# Patient Record
Sex: Female | Born: 1970
Health system: Southern US, Community
[De-identification: ages and names within clinical notes are randomized; demographics above are authoritative.]

## PROBLEM LIST (undated history)

## (undated) DIAGNOSIS — F410 Panic disorder [episodic paroxysmal anxiety] without agoraphobia: Secondary | ICD-10-CM

## (undated) DIAGNOSIS — F32A Depression, unspecified: Secondary | ICD-10-CM

## (undated) DIAGNOSIS — E039 Hypothyroidism, unspecified: Secondary | ICD-10-CM

## (undated) DIAGNOSIS — E559 Vitamin D deficiency, unspecified: Secondary | ICD-10-CM

## (undated) DIAGNOSIS — F329 Major depressive disorder, single episode, unspecified: Secondary | ICD-10-CM

## (undated) DIAGNOSIS — F419 Anxiety disorder, unspecified: Secondary | ICD-10-CM

## (undated) DIAGNOSIS — I10 Essential (primary) hypertension: Secondary | ICD-10-CM

## (undated) DIAGNOSIS — R5383 Other fatigue: Secondary | ICD-10-CM

## (undated) DIAGNOSIS — E119 Type 2 diabetes mellitus without complications: Secondary | ICD-10-CM

## (undated) HISTORY — DX: Other fatigue: R53.83

## (undated) HISTORY — DX: Major depressive disorder, single episode, unspecified: F32.9

## (undated) HISTORY — DX: Panic disorder (episodic paroxysmal anxiety): F41.0

## (undated) HISTORY — DX: Type 2 diabetes mellitus without complications: E11.9

## (undated) HISTORY — DX: Hypothyroidism, unspecified: E03.9

## (undated) HISTORY — DX: Anxiety disorder, unspecified: F41.9

## (undated) HISTORY — DX: Vitamin D deficiency, unspecified: E55.9

## (undated) HISTORY — DX: Depression, unspecified: F32.A

## (undated) HISTORY — DX: Essential (primary) hypertension: I10

---
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
## 2003-11-22 HISTORY — PX: THYROIDECTOMY: SHX17

## 2006-03-22 ENCOUNTER — Ambulatory Visit: Payer: Self-pay

## 2006-08-14 ENCOUNTER — Encounter: Payer: Self-pay | Admitting: General Practice

## 2006-08-21 ENCOUNTER — Encounter: Payer: Self-pay | Admitting: General Practice

## 2006-09-21 ENCOUNTER — Encounter: Payer: Self-pay | Admitting: General Practice

## 2007-08-17 ENCOUNTER — Ambulatory Visit: Payer: Self-pay

## 2008-04-16 ENCOUNTER — Emergency Department: Payer: Self-pay | Admitting: Emergency Medicine

## 2010-07-15 ENCOUNTER — Ambulatory Visit: Payer: Self-pay

## 2010-07-20 ENCOUNTER — Other Ambulatory Visit: Payer: Self-pay | Admitting: Physician Assistant

## 2011-01-10 ENCOUNTER — Other Ambulatory Visit: Payer: Self-pay

## 2011-04-15 ENCOUNTER — Other Ambulatory Visit: Payer: Self-pay | Admitting: Physician Assistant

## 2013-07-09 ENCOUNTER — Ambulatory Visit: Payer: Self-pay | Admitting: General Practice

## 2013-07-24 ENCOUNTER — Ambulatory Visit: Payer: Self-pay | Admitting: General Practice

## 2014-02-25 ENCOUNTER — Ambulatory Visit: Payer: Self-pay | Admitting: Obstetrics and Gynecology

## 2015-01-22 LAB — HM PAP SMEAR: HM PAP: NEGATIVE

## 2015-05-21 ENCOUNTER — Other Ambulatory Visit: Payer: Self-pay | Admitting: Obstetrics and Gynecology

## 2015-05-21 ENCOUNTER — Telehealth: Payer: Self-pay | Admitting: *Deleted

## 2015-05-21 MED ORDER — CITALOPRAM HYDROBROMIDE 20 MG PO TABS: 20.0000 mg | ORAL_TABLET | Freq: Every day | ORAL | Status: DC

## 2015-05-21 NOTE — Telephone Encounter (Signed)
Please let her know i sent a prescription in

## 2015-05-21 NOTE — Telephone Encounter (Signed)
Pt would like to go back on celexa, please send rx to [place]

## 2015-05-21 NOTE — Telephone Encounter (Signed)
Notified pt. 

## 2016-01-26 ENCOUNTER — Encounter: Payer: Self-pay | Admitting: Obstetrics and Gynecology

## 2016-02-15 ENCOUNTER — Encounter: Payer: Self-pay | Admitting: *Deleted

## 2016-02-16 ENCOUNTER — Encounter: Payer: 59 | Admitting: Obstetrics and Gynecology

## 2016-02-17 ENCOUNTER — Other Ambulatory Visit: Payer: Self-pay | Admitting: Obstetrics and Gynecology

## 2016-02-17 DIAGNOSIS — M542 Cervicalgia: Secondary | ICD-10-CM

## 2016-02-17 DIAGNOSIS — G8929 Other chronic pain: Secondary | ICD-10-CM | POA: Insufficient documentation

## 2016-02-17 MED ORDER — CYCLOBENZAPRINE HCL 10 MG PO TABS: 10.0000 mg | ORAL_TABLET | Freq: Three times a day (TID) | ORAL | Status: DC | PRN

## 2016-02-22 ENCOUNTER — Ambulatory Visit: Payer: Self-pay | Admitting: Physician Assistant

## 2016-02-22 ENCOUNTER — Encounter: Payer: Self-pay | Admitting: Physician Assistant

## 2016-02-22 VITALS — BP 122/90 | HR 80 | Temp 98.6°F

## 2016-02-22 DIAGNOSIS — R509 Fever, unspecified: Secondary | ICD-10-CM

## 2016-02-22 DIAGNOSIS — J111 Influenza due to unidentified influenza virus with other respiratory manifestations: Secondary | ICD-10-CM

## 2016-02-22 DIAGNOSIS — R69 Illness, unspecified: Secondary | ICD-10-CM

## 2016-02-22 LAB — POCT INFLUENZA A/B
Influenza A, POC: NEGATIVE
Influenza B, POC: NEGATIVE

## 2016-02-22 MED ORDER — HYDROCOD POLST-CPM POLST ER 10-8 MG/5ML PO SUER: 5.0000 mL | Freq: Two times a day (BID) | ORAL | Status: DC | PRN

## 2016-02-22 NOTE — Progress Notes (Signed)
S: C/o runny nose and congestion with dry cough for 2 days, + fever, chills, some v/d, denies cp/sob, states her ribs hurt from coughing, no diarrhea or vomiting today  Using otc meds: robitussin  O: PE: vitals wnl, nad,  perrl eomi, normocephalic, tms dull, nasal mucosa red and swollen, throat injected, neck supple no lymph, lungs c t a, cv rrr, abd soft nontender bs a little hyper in all 4 quads; neuro intact, flu swab neg  A:  Acute flu like illness   P: drink fluids, continue regular meds , use otc meds of choice, return if not improving in 5 days, return earlier if worsening ; tussionex 123ml nr

## 2016-03-22 ENCOUNTER — Other Ambulatory Visit: Payer: Self-pay | Admitting: Obstetrics and Gynecology

## 2016-04-20 ENCOUNTER — Encounter: Payer: Self-pay | Admitting: Obstetrics and Gynecology

## 2016-04-20 ENCOUNTER — Ambulatory Visit (INDEPENDENT_AMBULATORY_CARE_PROVIDER_SITE_OTHER): Payer: 59 | Admitting: Obstetrics and Gynecology

## 2016-04-20 VITALS — BP 133/90 | HR 102 | Ht 64.0 in | Wt 202.6 lb

## 2016-04-20 DIAGNOSIS — N930 Postcoital and contact bleeding: Secondary | ICD-10-CM

## 2016-04-20 DIAGNOSIS — E559 Vitamin D deficiency, unspecified: Secondary | ICD-10-CM

## 2016-04-20 DIAGNOSIS — E039 Hypothyroidism, unspecified: Secondary | ICD-10-CM

## 2016-04-20 DIAGNOSIS — Z720 Tobacco use: Secondary | ICD-10-CM | POA: Diagnosis not present

## 2016-04-20 DIAGNOSIS — F17200 Nicotine dependence, unspecified, uncomplicated: Secondary | ICD-10-CM | POA: Insufficient documentation

## 2016-04-20 DIAGNOSIS — Z01419 Encounter for gynecological examination (general) (routine) without abnormal findings: Secondary | ICD-10-CM | POA: Diagnosis not present

## 2016-04-20 MED ORDER — ALPRAZOLAM 1 MG PO TABS: 1.0000 mg | ORAL_TABLET | Freq: Two times a day (BID) | ORAL | Status: DC | PRN

## 2016-04-20 NOTE — Patient Instructions (Signed)
  Place annual gynecologic exam patient instructions here.  Thank you for enrolling in [facility]. Please follow the instructions below to securely access your online medical record. MyChart allows you to send messages to your doctor, view your test results, manage appointments, and more.   How Do I Sign Up? 1. In your Internet browser, go to [employer] and enter [email or web address]. 2. Click on the Sign Up Now link in the Sign In box. You will see the New Member Sign Up page. 3. Enter your MyChart Access Code exactly as it appears below. You will not need to use this code after you've completed the sign-up process. If you do not sign up before the expiration date, you must request a new code.  MyChart Access Code: [email or web address] Expires: 06/19/2016 10:33 AM  4. Enter your Social Security Number ([id number]) and Date of Birth (mm/dd/yyyy) as indicated and click Submit. You will be taken to the next sign-up page. 5. Create a MyChart ID. This will be your MyChart login ID and cannot be changed, so think of one that is secure and easy to remember. 6. Create a MyChart password. You can change your password at any time. 7. Enter your Password Reset Question and Answer. This can be used at a later time if you forget your password.  8. Enter your e-mail address. You will receive e-mail notification when new information is available in [facility]. 9. Click Sign Up. You can now view your medical record.   Additional Information Remember, MyChart is NOT to be used for urgent needs. For medical emergencies, dial 911.

## 2016-04-20 NOTE — Progress Notes (Signed)
Subjective:   Julie Marquez is a 45 y.o. G6P1 Caucasian female here for a routine well-woman exam.  Patient's last menstrual period was 04/04/2016.    Current complaints: 2 episodes of bright red painless bleeding after sex PCP: ?       does desire labs  Social History: Sexual: heterosexual Marital Status: married Living situation: with spouse Occupation: CNA at [employer] Tobacco/alcohol: no alcohol use Illicit drugs: no history of illicit drug use  The following portions of the patient's history were reviewed and updated as appropriate: allergies, current medications, past family history, past medical history, past social history, past surgical history and problem list.  Past Medical History Past Medical History  Diagnosis Date  . Anxiety   . Vitamin D deficiency   . Hypothyroidism   . Fatigue   . Panic attack     Past Surgical History Past Surgical History  Procedure Laterality Date  . Thyroidectomy  2005    Gynecologic History G1P1  Patient's last menstrual period was 04/04/2016. Contraception: IUD Last Pap: 2015. Results were: normal Last mammogram: 2015. Results were: normal  Obstetric History OB History  Gravida Para Term Preterm AB SAB TAB Ectopic Multiple Living  1 1            # Outcome Date GA Lbr Len/2nd Weight Sex Delivery Anes PTL Lv  1 Para               Current Medications Current Outpatient Prescriptions on File Prior to Visit  Medication Sig Dispense Refill  . amLODipine (NORVASC) 5 MG tablet TAKE 1 TABLET BY MOUTH ONCE DAILY 90 tablet 1  . citalopram (CELEXA) 20 MG tablet Take 1 tablet (20 mg total) by mouth daily. 30 tablet 12  . cyclobenzaprine (FLEXERIL) 10 MG tablet Take 1 tablet (10 mg total) by mouth every 8 (eight) hours as needed for muscle spasms. 30 tablet 1  . levothyroxine (SYNTHROID, LEVOTHROID) 88 MCG tablet TAKE 1 TABLET BY MOUTH ONCE DAILY 90 tablet 0  . PARAGARD INTRAUTERINE COPPER IU by Intrauterine route.    . Vitamin D,  Ergocalciferol, (DRISDOL) 50000 units CAPS capsule TAKE 1 CAPSULE BY MOUTH 2 TIMES A WEEK 24 capsule 0  . chlorpheniramine-HYDROcodone (TUSSIONEX PENNKINETIC ER) 10-8 MG/5ML SUER Take 5 mLs by mouth every 12 (twelve) hours as needed for cough. (Patient not taking: Reported on 04/20/2016) 150 mL 0  . DULoxetine (CYMBALTA) 30 MG capsule Take 30 mg by mouth daily. Reported on 04/20/2016     No current facility-administered medications on file prior to visit.    Review of Systems Patient denies any headaches, blurred vision, shortness of breath, chest pain, abdominal pain, problems with bowel movements, urination, or intercourse.  Objective:  BP 133/90 mmHg  Pulse 102  Ht 5\' 4"  (1.626 m)  Wt 202 lb 9.6 oz (91.899 kg)  BMI 34.76 kg/m2  LMP 04/04/2016 Physical Exam  General:  Well developed, well nourished, no acute distress. She is alert and oriented x3. Skin:  Warm and dry Neck:  Midline trachea, no thyromegaly or nodules Cardiovascular: Regular rate and rhythm, no murmur heard Lungs:  Effort normal, all lung fields clear to auscultation bilaterally Breasts:  No dominant palpable mass, retraction, or nipple discharge Abdomen:  Soft, non tender, no hepatosplenomegaly or masses Pelvic:  External genitalia is normal in appearance.  The vagina is normal in appearance. The cervix is bulbous, no CMT. IUD string noted. Thin prep pap is not done . Uterus is felt to be normal  size, shape, and contour.  No adnexal masses or tenderness noted. Extremities:  No swelling or varicosities noted Psych:  She has a normal mood and affect  Assessment:   Healthy well-woman exam Hypothyroidism obesity Hypertension H/o vitamin d deficiency Postcoital bleeding   Plan:  Labs obtained F/U 1 year for AE, or sooner if needed Mammogram scheduled  Sargent Mankey Rockney Ghee, CNM

## 2016-04-21 LAB — COMPREHENSIVE METABOLIC PANEL
A/G RATIO: 1.6 (ref 1.2–2.2)
ALT: 21 IU/L (ref 0–32)
AST: 17 IU/L (ref 0–40)
Albumin: 4.1 g/dL (ref 3.5–5.5)
Alkaline Phosphatase: 101 IU/L (ref 39–117)
BUN/Creatinine Ratio: 16 (ref 9–23)
BUN: 10 mg/dL (ref 6–24)
CALCIUM: 9.5 mg/dL (ref 8.7–10.2)
CHLORIDE: 101 mmol/L (ref 96–106)
CO2: 22 mmol/L (ref 18–29)
Creatinine, Ser: 0.64 mg/dL (ref 0.57–1.00)
GFR calc Af Amer: 125 mL/min/{1.73_m2} (ref >59)
GFR, EST NON AFRICAN AMERICAN: 108 mL/min/{1.73_m2} (ref >59)
GLUCOSE: 85 mg/dL (ref 65–99)
Globulin, Total: 2.6 g/dL (ref 1.5–4.5)
POTASSIUM: 4.2 mmol/L (ref 3.5–5.2)
Sodium: 140 mmol/L (ref 134–144)
Total Protein: 6.7 g/dL (ref 6.0–8.5)

## 2016-04-21 LAB — CBC
HEMATOCRIT: 40 % (ref 34.0–46.6)
HEMOGLOBIN: 13.1 g/dL (ref 11.1–15.9)
MCH: 25.5 pg — ABNORMAL LOW (ref 26.6–33.0)
MCHC: 32.8 g/dL (ref 31.5–35.7)
MCV: 78 fL — ABNORMAL LOW (ref 79–97)
PLATELETS: 326 10*3/uL (ref 150–379)
RBC: 5.13 x10E6/uL (ref 3.77–5.28)
RDW: 16.4 % — AB (ref 12.3–15.4)
WBC: 9.5 10*3/uL (ref 3.4–10.8)

## 2016-04-21 LAB — LIPID PANEL
CHOL/HDL RATIO: 4.4 ratio (ref 0.0–4.4)
Cholesterol, Total: 205 mg/dL — ABNORMAL HIGH (ref 100–199)
HDL: 47 mg/dL (ref >39)
LDL CALC: 119 mg/dL — AB (ref 0–99)
TRIGLYCERIDES: 196 mg/dL — AB (ref 0–149)
VLDL Cholesterol Cal: 39 mg/dL (ref 5–40)

## 2016-04-21 LAB — THYROID PANEL WITH TSH
FREE THYROXINE INDEX: 2.5 (ref 1.2–4.9)
T3 Uptake Ratio: 22 % — ABNORMAL LOW (ref 24–39)
T4, Total: 11.4 ug/dL (ref 4.5–12.0)
TSH: 0.12 u[IU]/mL — ABNORMAL LOW (ref 0.450–4.500)

## 2016-04-21 LAB — VITAMIN D 25 HYDROXY (VIT D DEFICIENCY, FRACTURES): VIT D 25 HYDROXY: 54.5 ng/mL (ref 30.0–100.0)

## 2016-04-21 LAB — HEMOGLOBIN A1C
ESTIMATED AVERAGE GLUCOSE: 151 mg/dL
Hgb A1c MFr Bld: 6.9 % — ABNORMAL HIGH (ref 4.8–5.6)

## 2016-04-22 ENCOUNTER — Other Ambulatory Visit: Payer: Self-pay | Admitting: Obstetrics and Gynecology

## 2016-04-22 ENCOUNTER — Telehealth: Payer: Self-pay | Admitting: *Deleted

## 2016-04-22 DIAGNOSIS — E119 Type 2 diabetes mellitus without complications: Secondary | ICD-10-CM | POA: Insufficient documentation

## 2016-04-22 DIAGNOSIS — E038 Other specified hypothyroidism: Secondary | ICD-10-CM

## 2016-04-22 DIAGNOSIS — E039 Hypothyroidism, unspecified: Secondary | ICD-10-CM | POA: Insufficient documentation

## 2016-04-22 NOTE — Telephone Encounter (Signed)
Mailed all info about lab results to pt

## 2016-04-22 NOTE — Telephone Encounter (Signed)
-----   Message from Joylene Igo, [place] sent at 04/22/2016  9:17 AM EDT ----- Have attempted to call a few times- mailbox not set up- please let her know about labs, and that she needs to f/u with endocrinologist about TSH. Also she now has diabetes- I put in a referral for Lifestyle and she needs to again be seen by endocrinologist ASAP to start treatment. Please mail info on diabetes

## 2016-05-28 ENCOUNTER — Other Ambulatory Visit: Payer: Self-pay | Admitting: Obstetrics and Gynecology

## 2016-05-28 DIAGNOSIS — Z1231 Encounter for screening mammogram for malignant neoplasm of breast: Secondary | ICD-10-CM

## 2016-07-15 ENCOUNTER — Other Ambulatory Visit: Payer: Self-pay | Admitting: Obstetrics and Gynecology

## 2016-08-08 ENCOUNTER — Other Ambulatory Visit: Payer: Self-pay | Admitting: Family

## 2016-08-08 ENCOUNTER — Ambulatory Visit: Payer: Self-pay | Admitting: Family

## 2016-08-08 ENCOUNTER — Ambulatory Visit
Admission: RE | Admit: 2016-08-08 | Discharge: 2016-08-08 | Disposition: A | Discharge status: Home / Self Care | Payer: 59 | Source: Ambulatory Visit | Attending: Family | Admitting: Family

## 2016-08-08 ENCOUNTER — Encounter: Payer: Self-pay | Admitting: Physician Assistant

## 2016-08-08 VITALS — BP 100/80 | HR 82 | Temp 98.6°F

## 2016-08-08 DIAGNOSIS — R1084 Generalized abdominal pain: Secondary | ICD-10-CM

## 2016-08-08 DIAGNOSIS — R112 Nausea with vomiting, unspecified: Secondary | ICD-10-CM

## 2016-08-08 DIAGNOSIS — R109 Unspecified abdominal pain: Secondary | ICD-10-CM | POA: Diagnosis not present

## 2016-08-08 DIAGNOSIS — K760 Fatty (change of) liver, not elsewhere classified: Secondary | ICD-10-CM | POA: Diagnosis not present

## 2016-08-08 DIAGNOSIS — R319 Hematuria, unspecified: Secondary | ICD-10-CM

## 2016-08-08 DIAGNOSIS — M549 Dorsalgia, unspecified: Secondary | ICD-10-CM

## 2016-08-08 LAB — POCT URINALYSIS DIPSTICK
Bilirubin, UA: NEGATIVE
Color, UA: NEGATIVE
GLUCOSE UA: NEGATIVE
Ketones, UA: NEGATIVE
Nitrite, UA: NEGATIVE
SPEC GRAV UA: 1.02
UROBILINOGEN UA: 1
pH, UA: 7

## 2016-08-08 LAB — POCT URINE PREGNANCY: PREG TEST UR: NEGATIVE

## 2016-08-08 MED ORDER — ONDANSETRON 8 MG PO TBDP: 8.0000 mg | ORAL_TABLET | Freq: Three times a day (TID) | ORAL | 0 refills | Status: DC | PRN

## 2016-08-08 MED ORDER — CIPROFLOXACIN HCL 500 MG PO TABS: 500.0000 mg | ORAL_TABLET | Freq: Two times a day (BID) | ORAL | 0 refills | Status: DC

## 2016-08-08 NOTE — Progress Notes (Signed)
S/ acute onset last night of generalised  abd pain with waves of increasing intensity  , with nausea and vomiting, unable to sleep, back pain,  Admits, urinary frequency and urgency without burning: poor po fluid intake  , now with anorexia , bloating . Last bm 2 d ago , normal pattern is q 3 days, neg gyn sxs +paraquard- neg menses DM sugars normal   O/ appears mod uncomfortable, tearful VSS  U/a dip with 2 + blood/protein  Only Hcg neg Heart rsr Lungs clear Abd soft diffusely tender with guarding RLQ, BS +  A/ abd pain / hematuria / n&v,back pain ? Stone  ,? Appendicitis  Constipation  P/ referred for Kidney US . Zofran, cipro, tramadol. hydration encouraged , close check on sugars, oow today and tomorrow. If sxs not improving call ,RTC or  seek emergent care if after hours advised.

## 2016-08-08 NOTE — Progress Notes (Signed)
Spoke with Shaquinta in scheduling for U/S. Per Shaquinta patient to go to [place] @ 3:00 today to register in for Ultra sound

## 2016-08-10 ENCOUNTER — Encounter: Payer: Self-pay | Admitting: Family

## 2016-08-10 DIAGNOSIS — K760 Fatty (change of) liver, not elsewhere classified: Secondary | ICD-10-CM | POA: Insufficient documentation

## 2016-10-05 DIAGNOSIS — H5213 Myopia, bilateral: Secondary | ICD-10-CM | POA: Diagnosis not present

## 2016-10-05 LAB — HM DIABETES EYE EXAM

## 2016-11-08 ENCOUNTER — Encounter: Payer: Self-pay | Admitting: Obstetrics and Gynecology

## 2016-11-09 ENCOUNTER — Other Ambulatory Visit: Payer: Self-pay | Admitting: *Deleted

## 2016-11-09 MED ORDER — AMLODIPINE BESYLATE 5 MG PO TABS: 5.0000 mg | ORAL_TABLET | Freq: Every day | ORAL | 2 refills | Status: DC

## 2016-11-09 MED ORDER — LEVOTHYROXINE SODIUM 88 MCG PO TABS: 88.0000 ug | ORAL_TABLET | Freq: Every day | ORAL | 3 refills | Status: DC

## 2016-11-09 MED ORDER — CITALOPRAM HYDROBROMIDE 20 MG PO TABS: 20.0000 mg | ORAL_TABLET | Freq: Every day | ORAL | 4 refills | Status: DC

## 2016-11-09 MED ORDER — METFORMIN HCL ER 500 MG PO TB24: 500.0000 mg | ORAL_TABLET | Freq: Two times a day (BID) | ORAL | 4 refills | Status: DC

## 2016-11-09 MED ORDER — ALPRAZOLAM 1 MG PO TABS: 1.0000 mg | ORAL_TABLET | Freq: Two times a day (BID) | ORAL | 2 refills | Status: DC | PRN

## 2017-01-11 DIAGNOSIS — E559 Vitamin D deficiency, unspecified: Secondary | ICD-10-CM | POA: Insufficient documentation

## 2017-01-11 DIAGNOSIS — I10 Essential (primary) hypertension: Secondary | ICD-10-CM | POA: Diagnosis not present

## 2017-01-11 DIAGNOSIS — F419 Anxiety disorder, unspecified: Secondary | ICD-10-CM | POA: Diagnosis not present

## 2017-01-11 DIAGNOSIS — E039 Hypothyroidism, unspecified: Secondary | ICD-10-CM | POA: Diagnosis not present

## 2017-01-11 DIAGNOSIS — E119 Type 2 diabetes mellitus without complications: Secondary | ICD-10-CM | POA: Diagnosis not present

## 2017-02-17 DIAGNOSIS — F418 Other specified anxiety disorders: Secondary | ICD-10-CM | POA: Diagnosis not present

## 2017-03-27 DIAGNOSIS — R6889 Other general symptoms and signs: Secondary | ICD-10-CM | POA: Diagnosis not present

## 2017-03-27 DIAGNOSIS — E039 Hypothyroidism, unspecified: Secondary | ICD-10-CM | POA: Diagnosis not present

## 2017-03-27 DIAGNOSIS — E119 Type 2 diabetes mellitus without complications: Secondary | ICD-10-CM | POA: Diagnosis not present

## 2017-03-27 DIAGNOSIS — Z23 Encounter for immunization: Secondary | ICD-10-CM | POA: Diagnosis not present

## 2017-03-27 DIAGNOSIS — F419 Anxiety disorder, unspecified: Secondary | ICD-10-CM | POA: Diagnosis not present

## 2017-05-23 ENCOUNTER — Ambulatory Visit: Payer: Self-pay | Admitting: Family

## 2017-05-23 VITALS — BP 120/80 | HR 82 | Temp 98.4°F

## 2017-05-23 DIAGNOSIS — R059 Cough, unspecified: Secondary | ICD-10-CM

## 2017-05-23 DIAGNOSIS — Z789 Other specified health status: Secondary | ICD-10-CM

## 2017-05-23 DIAGNOSIS — R05 Cough: Secondary | ICD-10-CM

## 2017-05-23 DIAGNOSIS — R0982 Postnasal drip: Secondary | ICD-10-CM

## 2017-05-23 MED ORDER — FLUTICASONE PROPIONATE 50 MCG/ACT NA SUSP: 2.0000 | Freq: Every day | NASAL | 6 refills | Status: DC

## 2017-05-23 MED ORDER — OMEPRAZOLE 40 MG PO CPDR: 40.0000 mg | DELAYED_RELEASE_CAPSULE | Freq: Every day | ORAL | 3 refills | Status: DC

## 2017-05-23 MED ORDER — AZITHROMYCIN 250 MG PO TABS: ORAL_TABLET | ORAL | 0 refills | Status: DC

## 2017-05-23 NOTE — Progress Notes (Signed)
S:/Dry irritative cough for 4 months so severe that she will vomit. Admits to sour eructation. Without nausea , or fatty food intolerance. Upon questioning states she is drinking 80-100 ounces of caffeine in the form of diet sodas and coffee throughout the day. She has been told she has allergies but is not taking any medication as states they did not work. She has tried [employer]. She states that when she coughs violently ,her left arm is numb but denies neck issues.  Objective vital signs stable alert pleasant no acute distress ENT: nasal mucosa erythematous 4+ boggy, left maxillary tenderness, pharynx clear neck supple without adenopathy heart RSR lungs are clear abdomen nontender A/: Cough question from PND and or reflux Excessive caffeine intake  P/: Strongly urged to stop caffeine products due to GI side effects likely contributing to and not helping her reflux symptoms. She is urged to drink water. Rx for Prilosec 40 mg given to take one time daily for a month follow-up. If symptoms not improving may need referral to GI. Other preventive measures are discussed.  allergy tips reviewed and encouraged . Rx for Flonase given.

## 2017-07-27 ENCOUNTER — Other Ambulatory Visit: Payer: Self-pay | Admitting: Obstetrics and Gynecology

## 2017-10-17 DIAGNOSIS — H109 Unspecified conjunctivitis: Secondary | ICD-10-CM | POA: Diagnosis not present

## 2017-12-28 ENCOUNTER — Ambulatory Visit (INDEPENDENT_AMBULATORY_CARE_PROVIDER_SITE_OTHER): Payer: Self-pay | Admitting: Family Medicine

## 2017-12-28 VITALS — BP 125/76 | HR 86 | Temp 98.4°F | Wt 197.0 lb

## 2017-12-28 DIAGNOSIS — H6123 Impacted cerumen, bilateral: Secondary | ICD-10-CM

## 2017-12-28 MED ORDER — CARBAMIDE PEROXIDE 6.5 % OT SOLN: 5.0000 [drp] | Freq: Two times a day (BID) | OTIC | 0 refills | Status: DC

## 2017-12-28 NOTE — Progress Notes (Signed)
Subjective:  Julie Marquez is a 47 y.o. female who presents for evaluation of bilateral ear clogging.  Onset of symptoms was 1 week ago, and has been unchanged since that time.  Treatment to date: Sweet oil drops. Denies pain or drainage. Endorses diminished hearing.   The following portions of the patient's history were reviewed and updated as appropriate:  allergies, current medications and past medical history.  Pertinent items are noted in HPI. Objective:  BP 125/76   Pulse 86   Temp 98.4 F (36.9 C)   Wt 197 lb (89.4 kg)   SpO2 96%   BMI 33.81 kg/m  Ears: abnormal TM right ear - cerumen impacted    Assessment:  Cerumen Impaction     Plan:  Ear irrigation performed. Debrox drops prescribed as needed to prevent recurring cerumen impaction.   Meds ordered this encounter  Medications  . carbamide peroxide (DEBROX) 6.5 % OTIC solution    Sig: Place 5 drops into both ears 2 (two) times daily.    Dispense:  15 mL    Refill:  0     RTC: As needed     Carroll Sage. Kenton Kingfisher, MSN, [place], [place] [address] [phone]

## 2017-12-28 NOTE — Patient Instructions (Addendum)
I have ordered Debrox ear drops for management of cerumen buildup. Use as directed.     Earwax Buildup, Adult The ears produce a substance called earwax that helps keep bacteria out of the ear and protects the skin in the ear canal. Occasionally, earwax can build up in the ear and cause discomfort or hearing loss. What increases the risk? This condition is more likely to develop in people who:  Are female.  Are elderly.  Naturally produce more earwax.  Clean their ears often with cotton swabs.  Use earplugs often.  Use in-ear headphones often.  Wear hearing aids.  Have narrow ear canals.  Have earwax that is overly thick or sticky.  Have eczema.  Are dehydrated.  Have excess hair in the ear canal.  What are the signs or symptoms? Symptoms of this condition include:  Reduced or muffled hearing.  A feeling of fullness in the ear or feeling that the ear is plugged.  Fluid coming from the ear.  Ear pain.  Ear itch.  Ringing in the ear.  Coughing.  An obvious piece of earwax that can be seen inside the ear canal.  How is this diagnosed? This condition may be diagnosed based on:  Your symptoms.  Your medical history.  An ear exam. During the exam, your health care provider will look into your ear with an instrument called an otoscope.  You may have tests, including a hearing test. How is this treated? This condition may be treated by:  Using ear drops to soften the earwax.  Having the earwax removed by a health care provider. The health care provider may: ? Flush the ear with water. ? Use an instrument that has a loop on the end (curette). ? Use a suction device.  Surgery to remove the wax buildup. This may be done in severe cases.  Follow these instructions at home:  Take over-the-counter and prescription medicines only as told by your health care provider.  Do not put any objects, including cotton swabs, into your ear. You can clean the opening  of your ear canal with a washcloth or facial tissue.  Follow instructions from your health care provider about cleaning your ears. Do not over-clean your ears.  Drink enough fluid to keep your urine clear or pale yellow. This will help to thin the earwax.  Keep all follow-up visits as told by your health care provider. If earwax builds up in your ears often or if you use hearing aids, consider seeing your health care provider for routine, preventive ear cleanings. Ask your health care provider how often you should schedule your cleanings.  If you have hearing aids, clean them according to instructions from the manufacturer and your health care provider. Contact a health care provider if:  You have ear pain.  You develop a fever.  You have blood, pus, or other fluid coming from your ear.  You have hearing loss.  You have ringing in your ears that does not go away.  Your symptoms do not improve with treatment.  You feel like the room is spinning (vertigo). Summary  Earwax can build up in the ear and cause discomfort or hearing loss.  The most common symptoms of this condition include reduced or muffled hearing and a feeling of fullness in the ear or feeling that the ear is plugged.  This condition may be diagnosed based on your symptoms, your medical history, and an ear exam.  This condition may be treated by using ear  drops to soften the earwax or by having the earwax removed by a health care provider.  Do not put any objects, including cotton swabs, into your ear. You can clean the opening of your ear canal with a washcloth or facial tissue. This information is not intended to replace advice given to you by your health care provider. Make sure you discuss any questions you have with your health care provider. Document Released: 12/15/2004 Document Revised: 01/18/2017 Document Reviewed: 01/18/2017 Elsevier Interactive Patient Education  [employer].

## 2018-02-22 ENCOUNTER — Other Ambulatory Visit: Payer: Self-pay | Admitting: Obstetrics and Gynecology

## 2018-04-26 ENCOUNTER — Telehealth: Payer: Self-pay | Admitting: Obstetrics and Gynecology

## 2018-04-26 NOTE — Telephone Encounter (Signed)
The patient called and stated that she would like to speak with Amy in regards to her needing an Annual Exam scheduled. Please advise.

## 2018-04-27 NOTE — Telephone Encounter (Signed)
Called pt she will call back ,needs annual done for insurance

## 2018-05-09 ENCOUNTER — Encounter: Payer: Self-pay | Admitting: Family Medicine

## 2018-05-09 ENCOUNTER — Ambulatory Visit (INDEPENDENT_AMBULATORY_CARE_PROVIDER_SITE_OTHER): Payer: Self-pay | Admitting: Family Medicine

## 2018-05-09 ENCOUNTER — Ambulatory Visit (INDEPENDENT_AMBULATORY_CARE_PROVIDER_SITE_OTHER): Payer: No Typology Code available for payment source | Admitting: Family Medicine

## 2018-05-09 VITALS — BP 130/86 | HR 88 | Temp 98.3°F | Ht 63.0 in | Wt 196.0 lb

## 2018-05-09 VITALS — BP 116/82 | HR 97 | Temp 98.0°F | Resp 12 | Ht 63.0 in | Wt 196.3 lb

## 2018-05-09 DIAGNOSIS — Z5181 Encounter for therapeutic drug level monitoring: Secondary | ICD-10-CM | POA: Diagnosis not present

## 2018-05-09 DIAGNOSIS — Z1231 Encounter for screening mammogram for malignant neoplasm of breast: Secondary | ICD-10-CM | POA: Diagnosis not present

## 2018-05-09 DIAGNOSIS — Z1239 Encounter for other screening for malignant neoplasm of breast: Secondary | ICD-10-CM

## 2018-05-09 DIAGNOSIS — E1165 Type 2 diabetes mellitus with hyperglycemia: Secondary | ICD-10-CM

## 2018-05-09 DIAGNOSIS — G4769 Other sleep related movement disorders: Secondary | ICD-10-CM | POA: Insufficient documentation

## 2018-05-09 DIAGNOSIS — E038 Other specified hypothyroidism: Secondary | ICD-10-CM | POA: Diagnosis not present

## 2018-05-09 DIAGNOSIS — Z0000 Encounter for general adult medical examination without abnormal findings: Secondary | ICD-10-CM

## 2018-05-09 DIAGNOSIS — M542 Cervicalgia: Secondary | ICD-10-CM

## 2018-05-09 DIAGNOSIS — Z720 Tobacco use: Secondary | ICD-10-CM | POA: Diagnosis not present

## 2018-05-09 DIAGNOSIS — R3129 Other microscopic hematuria: Secondary | ICD-10-CM | POA: Diagnosis not present

## 2018-05-09 DIAGNOSIS — I10 Essential (primary) hypertension: Secondary | ICD-10-CM | POA: Diagnosis not present

## 2018-05-09 DIAGNOSIS — R809 Proteinuria, unspecified: Secondary | ICD-10-CM | POA: Diagnosis not present

## 2018-05-09 DIAGNOSIS — IMO0002 Reserved for concepts with insufficient information to code with codable children: Secondary | ICD-10-CM | POA: Insufficient documentation

## 2018-05-09 DIAGNOSIS — K760 Fatty (change of) liver, not elsewhere classified: Secondary | ICD-10-CM | POA: Diagnosis not present

## 2018-05-09 DIAGNOSIS — E119 Type 2 diabetes mellitus without complications: Secondary | ICD-10-CM | POA: Diagnosis not present

## 2018-05-09 HISTORY — DX: Tobacco use: Z72.0

## 2018-05-09 NOTE — Patient Instructions (Signed)
Continue close with PCP to address all overdue health maintenance and obtain recommended labs as these are unable to be performed here at this site today.  Due to your chronic conditions, I recommend you schedule a complete physical exam with your PCP as today only services as a general wellness exam that will satisfy the requirement of your employee sponsored health benefits.   Health Maintenance, Female Adopting a healthy lifestyle and getting preventive care can go a long way to promote health and wellness. Talk with your health care provider about what schedule of regular examinations is right for you. This is a good chance for you to check in with your provider about disease prevention and staying healthy. In between checkups, there are plenty of things you can do on your own. Experts have done a lot of research about which lifestyle changes and preventive measures are most likely to keep you healthy. Ask your health care provider for more information. Weight and diet Eat a healthy diet  Be sure to include plenty of vegetables, fruits, low-fat dairy products, and lean protein.  Do not eat a lot of foods high in solid fats, added sugars, or salt.  Get regular exercise. This is one of the most important things you can do for your health. ? Most adults should exercise for at least 150 minutes each week. The exercise should increase your heart rate and make you sweat (moderate-intensity exercise). ? Most adults should also do strengthening exercises at least twice a week. This is in addition to the moderate-intensity exercise.  Maintain a healthy weight  Body mass index (BMI) is a measurement that can be used to identify possible weight problems. It estimates body fat based on height and weight. Your health care provider can help determine your BMI and help you achieve or maintain a healthy weight.  For females 11 years of age and older: ? A BMI below 18.5 is considered underweight. ? A BMI  of 18.5 to 24.9 is normal. ? A BMI of 25 to 29.9 is considered overweight. ? A BMI of 30 and above is considered obese.  Watch levels of cholesterol and blood lipids  You should start having your blood tested for lipids and cholesterol at 47 years of age, then have this test every 5 years.  You may need to have your cholesterol levels checked more often if: ? Your lipid or cholesterol levels are high. ? You are older than 47 years of age. ? You are at high risk for heart disease.  Cancer screening Lung Cancer  Lung cancer screening is recommended for adults 68-45 years old who are at high risk for lung cancer because of a history of smoking.  A yearly low-dose CT scan of the lungs is recommended for people who: ? Currently smoke. ? Have quit within the past 15 years. ? Have at least a 30-pack-year history of smoking. A pack year is smoking an average of one pack of cigarettes a day for 1 year.  Yearly screening should continue until it has been 15 years since you quit.  Yearly screening should stop if you develop a health problem that would prevent you from having lung cancer treatment.  Breast Cancer  Practice breast self-awareness. This means understanding how your breasts normally appear and feel.  It also means doing regular breast self-exams. Let your health care provider know about any changes, no matter how small.  If you are in your 20s or 30s, you should have a clinical  breast exam (CBE) by a health care provider every 1-3 years as part of a regular health exam.  If you are 42 or older, have a CBE every year. Also consider having a breast X-ray (mammogram) every year.  If you have a family history of breast cancer, talk to your health care provider about genetic screening.  If you are at high risk for breast cancer, talk to your health care provider about having an MRI and a mammogram every year.  Breast cancer gene (BRCA) assessment is recommended for women who have  family members with BRCA-related cancers. BRCA-related cancers include: ? Breast. ? Ovarian. ? Tubal. ? Peritoneal cancers.  Results of the assessment will determine the need for genetic counseling and BRCA1 and BRCA2 testing.  Cervical Cancer Your health care provider may recommend that you be screened regularly for cancer of the pelvic organs (ovaries, uterus, and vagina). This screening involves a pelvic examination, including checking for microscopic changes to the surface of your cervix (Pap test). You may be encouraged to have this screening done every 3 years, beginning at age 30.  For women ages 89-65, health care providers may recommend pelvic exams and Pap testing every 3 years, or they may recommend the Pap and pelvic exam, combined with testing for human papilloma virus (HPV), every 5 years. Some types of HPV increase your risk of cervical cancer. Testing for HPV may also be done on women of any age with unclear Pap test results.  Other health care providers may not recommend any screening for nonpregnant women who are considered low risk for pelvic cancer and who do not have symptoms. Ask your health care provider if a screening pelvic exam is right for you.  If you have had past treatment for cervical cancer or a condition that could lead to cancer, you need Pap tests and screening for cancer for at least 20 years after your treatment. If Pap tests have been discontinued, your risk factors (such as having a new sexual partner) need to be reassessed to determine if screening should resume. Some women have medical problems that increase the chance of getting cervical cancer. In these cases, your health care provider may recommend more frequent screening and Pap tests.  Colorectal Cancer  This type of cancer can be detected and often prevented.  Routine colorectal cancer screening usually begins at 47 years of age and continues through 47 years of age.  Your health care provider may  recommend screening at an earlier age if you have risk factors for colon cancer.  Your health care provider may also recommend using home test kits to check for hidden blood in the stool.  A small camera at the end of a tube can be used to examine your colon directly (sigmoidoscopy or colonoscopy). This is done to check for the earliest forms of colorectal cancer.  Routine screening usually begins at age 61.  Direct examination of the colon should be repeated every 5-10 years through 47 years of age. However, you may need to be screened more often if early forms of precancerous polyps or small growths are found.  Skin Cancer  Check your skin from head to toe regularly.  Tell your health care provider about any new moles or changes in moles, especially if there is a change in a mole's shape or color.  Also tell your health care provider if you have a mole that is larger than the size of a pencil eraser.  Always use sunscreen. Apply  sunscreen liberally and repeatedly throughout the day.  Protect yourself by wearing long sleeves, pants, a wide-brimmed hat, and sunglasses whenever you are outside.  Heart disease, diabetes, and high blood pressure  High blood pressure causes heart disease and increases the risk of stroke. High blood pressure is more likely to develop in: ? People who have blood pressure in the high end of the normal range (130-139/85-89 mm Hg). ? People who are overweight or obese. ? People who are African American.  If you are 27-30 years of age, have your blood pressure checked every 3-5 years. If you are 71 years of age or older, have your blood pressure checked every year. You should have your blood pressure measured twice-once when you are at a hospital or clinic, and once when you are not at a hospital or clinic. Record the average of the two measurements. To check your blood pressure when you are not at a hospital or clinic, you can use: ? An automated blood pressure  machine at a pharmacy. ? A home blood pressure monitor.  If you are between 56 years and 55 years old, ask your health care provider if you should take aspirin to prevent strokes.  Have regular diabetes screenings. This involves taking a blood sample to check your fasting blood sugar level. ? If you are at a normal weight and have a low risk for diabetes, have this test once every three years after 47 years of age. ? If you are overweight and have a high risk for diabetes, consider being tested at a younger age or more often. Preventing infection Hepatitis B  If you have a higher risk for hepatitis B, you should be screened for this virus. You are considered at high risk for hepatitis B if: ? You were born in a country where hepatitis B is common. Ask your health care provider which countries are considered high risk. ? Your parents were born in a high-risk country, and you have not been immunized against hepatitis B (hepatitis B vaccine). ? You have HIV or AIDS. ? You use needles to inject street drugs. ? You live with someone who has hepatitis B. ? You have had sex with someone who has hepatitis B. ? You get hemodialysis treatment. ? You take certain medicines for conditions, including cancer, organ transplantation, and autoimmune conditions.  Hepatitis C  Blood testing is recommended for: ? Everyone born from [birth year] through 1965. ? Anyone with known risk factors for hepatitis C.  Sexually transmitted infections (STIs)  You should be screened for sexually transmitted infections (STIs) including gonorrhea and chlamydia if: ? You are sexually active and are younger than 47 years of age. ? You are older than 47 years of age and your health care provider tells you that you are at risk for this type of infection. ? Your sexual activity has changed since you were last screened and you are at an increased risk for chlamydia or gonorrhea. Ask your health care provider if you are at  risk.  If you do not have HIV, but are at risk, it may be recommended that you take a prescription medicine daily to prevent HIV infection. This is called pre-exposure prophylaxis (PrEP). You are considered at risk if: ? You are sexually active and do not regularly use condoms or know the HIV status of your partner(s). ? You take drugs by injection. ? You are sexually active with a partner who has HIV.  Talk with your health care provider  about whether you are at high risk of being infected with HIV. If you choose to begin PrEP, you should first be tested for HIV. You should then be tested every 3 months for as long as you are taking PrEP. Pregnancy  If you are premenopausal and you may become pregnant, ask your health care provider about preconception counseling.  If you may become pregnant, take 400 to 800 micrograms (mcg) of folic acid every day.  If you want to prevent pregnancy, talk to your health care provider about birth control (contraception). Osteoporosis and menopause  Osteoporosis is a disease in which the bones lose minerals and strength with aging. This can result in serious bone fractures. Your risk for osteoporosis can be identified using a bone density scan.  If you are 61 years of age or older, or if you are at risk for osteoporosis and fractures, ask your health care provider if you should be screened.  Ask your health care provider whether you should take a calcium or vitamin D supplement to lower your risk for osteoporosis.  Menopause may have certain physical symptoms and risks.  Hormone replacement therapy may reduce some of these symptoms and risks. Talk to your health care provider about whether hormone replacement therapy is right for you. Follow these instructions at home:  Schedule regular health, dental, and eye exams.  Stay current with your immunizations.  Do not use any tobacco products including cigarettes, chewing tobacco, or electronic  cigarettes.  If you are pregnant, do not drink alcohol.  If you are breastfeeding, limit how much and how often you drink alcohol.  Limit alcohol intake to no more than 1 drink per day for nonpregnant women. One drink equals 12 ounces of beer, 5 ounces of wine, or 1 ounces of hard liquor.  Do not use street drugs.  Do not share needles.  Ask your health care provider for help if you need support or information about quitting drugs.  Tell your health care provider if you often feel depressed.  Tell your health care provider if you have ever been abused or do not feel safe at home. This information is not intended to replace advice given to you by your health care provider. Make sure you discuss any questions you have with your health care provider. Document Released: 05/23/2011 Document Revised: 04/14/2016 Document Reviewed: 08/11/2015 Elsevier Interactive Patient Education  [employer].

## 2018-05-09 NOTE — Assessment & Plan Note (Signed)
Occasional use of flexeril; advised to never combine with alcohol or xanax

## 2018-05-09 NOTE — Assessment & Plan Note (Signed)
Refer to neurology sleep specialist

## 2018-05-09 NOTE — Assessment & Plan Note (Signed)
Encouraged cessation.

## 2018-05-09 NOTE — Assessment & Plan Note (Addendum)
On [place]; recommended weight loss, diet low in saturated fats

## 2018-05-09 NOTE — Progress Notes (Signed)
Subjective:  Julie Marquez is a 47 y.o. female who presents for wellness exam. Medical history is significant for hypertension, fatty liver disease, hypothyroidism, tobacco abuse (current daily smoker), obesity (BMI 34.7)and type 2 diabetes. Shamya established care with [facility] today, however, was unable to obtain a physical or overall wellness exam due to multiple chronic conditions needing to be addressed. She is required to obtain a wellness exam in order to meet the requirements of employer health sponsored health benefits. She denies any current health related concerns as they were addressed during her visit today with PCP.  Immunization History  Administered Date(s) Administered  . Pneumococcal Polysaccharide-23 03/27/2017   Past Medical History:  Diagnosis Date  . Anxiety   . Depression   . Diabetes mellitus without complication ([place])   . Fatigue   . Hypertension   . Hypothyroidism   . Panic attack   . Vitamin D deficiency     Past Surgical History:  Procedure Laterality Date  . THYROIDECTOMY  2005    Social History   Tobacco Use  . Smoking status: Current Every Day Smoker    Packs/day: 0.25    Years: 20.00    Pack years: 5.00    Types: Cigarettes  . Smokeless tobacco: Never Used  Substance Use Topics  . Alcohol use: Yes    Comment: occas  . Drug use: No    Allergies  Allergen Reactions  . Lisinopril     cough    Current Outpatient Medications  Medication Sig Dispense Refill  . ALPRAZolam (XANAX) 1 MG tablet TAKE 1 TABLET BY MOUTH TWO TIMES DAILY AS NEEDED FOR ANXIETY 45 tablet 2  . amLODipine (NORVASC) 5 MG tablet Take 1 tablet (5 mg total) by mouth daily. 90 tablet 2  . atorvastatin (LIPITOR) 20 MG tablet Take 20 mg by mouth daily.    . citalopram (CELEXA) 20 MG tablet Take 1 tablet (20 mg total) by mouth daily. 30 tablet 4  . cyclobenzaprine (FLEXERIL) 10 MG tablet Take 1 tablet (10 mg total) by mouth every 8 (eight) hours as needed  for muscle spasms. 30 tablet 1  . fluticasone (FLONASE) 50 MCG/ACT nasal spray Place 2 sprays into both nostrils daily. 16 g 6  . levothyroxine (SYNTHROID, LEVOTHROID) 25 MCG tablet Take 25 mcg by mouth daily.  0  . metFORMIN (GLUCOPHAGE-XR) 500 MG 24 hr tablet Take 1 tablet (500 mg total) by mouth 2 (two) times daily. 60 tablet 4  . PARAGARD INTRAUTERINE COPPER IU by Intrauterine route.     No current facility-administered medications for this visit.    ROS Pertinent negatives listed in HPI Objective:  Blood pressure 130/86, pulse 88, temperature 98.3 F (36.8 C), height 5\' 3"  (1.6 m), weight 196 lb (88.9 kg), last menstrual period 04/25/2018, SpO2 96 %. Physical Exam  Constitutional: She is oriented to person, place, and time. She appears well-developed and well-nourished.  HENT:  Head: Normocephalic.  Eyes: Pupils are equal, round, and reactive to light. Conjunctivae and EOM are normal.  Neck: Normal range of motion. Neck supple.  Cardiovascular: Normal rate, regular rhythm, normal heart sounds and intact distal pulses.  Respiratory: Effort normal and breath sounds normal.  Neurological: She is alert and oriented to person, place, and time.  Skin: Skin is warm and dry.  Psychiatric: She has a normal mood and affect. Her behavior is normal. Judgment and thought content normal.     Hearing Screening   125Hz  250Hz  500Hz  1000Hz  2000Hz  3000Hz   4000Hz  6000Hz  8000Hz   Right ear:   Pass Pass Pass  Pass    Left ear:   Pass Pass Pass  Pass      Visual Acuity Screening   Right eye Left eye Both eyes  Without correction: 20/25 20/25 20/25   With correction:      Assessment and Plan:   1. Encounter for wellness examination,  Patient education provided. Patient will follow up with PCP regarding ordered labs. Patient was advised that [facility] doesn't perform routine screening labs and chronic condition monitoring labs. patient also advised that due to multiple chronic conditions and overdue  health, maintenance I recommend a comprehensive complete physical exam performed by her PCP.  Patient verbalized understanding.   Carroll Sage. Kenton Kingfisher, MSN, FNP-C [facility]  [address] [place], [place] [address] [phone]

## 2018-05-09 NOTE — Assessment & Plan Note (Signed)
Didn't tolerate ACE-I (cough); doing well on CCB

## 2018-05-09 NOTE — Patient Instructions (Addendum)
Check out the information at familydoctor.org entitled "Nutrition for Weight Loss: What You Need to Know about Fad Diets" Try to lose between 1-2 pounds per week by taking in fewer calories and burning off more calories You can succeed by limiting portions, limiting foods dense in calories and fat, becoming more active, and drinking 8 glasses of water a day (64 ounces) Don't skip meals, especially breakfast, as skipping meals may alter your metabolism Do not use over-the-counter weight loss pills or gimmicks that claim rapid weight loss A healthy BMI (or body mass index) is between 18.5 and 24.9 You can calculate your ideal BMI at the [place] website ClubMonetize.fr  Please do see your eye doctor regularly, and have your eyes examined every year (or more often per his or her recommendation) Check your feet every night and let me know right away of any sores, infections, numbness, etc. Try to limit sweets, white bread, white rice, white potatoes It is okay with me for you to not check your fingerstick blood sugars (per AACE Best Practices), unless you are interested and feel it would be helpful for you  Call back with the name of lancets and machine/test strips  I do encourage you to quit smoking Call [phone] to sign up for smoking cessation classes You can call 1-800-QUIT-NOW to talk with a smoking cessation coach   Preventing Unhealthy Weight Gain, Adult Staying at a healthy weight is important. When fat builds up in your body, you may become overweight or obese. These conditions put you at greater risk for developing certain health problems, such as heart disease, diabetes, sleeping problems, joint problems, and some cancers. Unhealthy weight gain is often the result of making unhealthy choices in what you eat. It is also a result of not getting enough exercise. You can make changes to your lifestyle to prevent obesity and stay as healthy  as possible. What nutrition changes can be made? To maintain a healthy weight and prevent obesity:  Eat only as much as your body needs. To do this: ? Pay attention to signs that you are hungry or full. Stop eating as soon as you feel full. ? If you feel hungry, try drinking water first. Drink enough water so your urine is clear or pale yellow. ? Eat smaller portions. ? Look at serving sizes on food labels. Most foods contain more than one serving per container. ? Eat the recommended amount of calories for your gender and activity level. While most active people should eat around 2,000 calories per day, if you are trying to lose weight or are not very active, you main need to eat less calories. Talk to your health care provider or dietitian about how many calories you should eat each day.  Choose healthy foods, such as: ? Fruits and vegetables. Try to fill at least half of your plate at each meal with fruits and vegetables. ? Whole grains, such as whole wheat bread, brown rice, and quinoa. ? Lean meats, such as chicken or fish. ? Other healthy proteins, such as beans, eggs, or tofu. ? Healthy fats, such as nuts, seeds, fatty fish, and olive oil. ? Low-fat or fat-free dairy.  Check food labels and avoid food and drinks that: ? Are high in calories. ? Have added sugar. ? Are high in sodium. ? Have saturated fats or trans fats.  Limit how much you eat of the following foods: ? Prepackaged meals. ? Fast food. ? Fried foods. ? Processed meat, such as bacon, sausage,  and deli meats. ? Fatty cuts of red meat and poultry with skin.  Cook foods in healthier ways, such as by baking, broiling, or grilling.  When grocery shopping, try to shop around the outside of the store. This helps you buy mostly fresh foods and avoid canned and prepackaged foods.  What lifestyle changes can be made?  Exercise at least 30 minutes 5 or more days each week. Exercising includes brisk walking, yard work,  biking, running, swimming, and team sports like basketball and soccer. Ask your health care provider which exercises are safe for you.  Do not use any products that contain nicotine or tobacco, such as cigarettes and e-cigarettes. If you need help quitting, ask your health care provider.  Limit alcohol intake to no more than 1 drink a day for nonpregnant women and 2 drinks a day for men. One drink equals 12 oz of beer, 5 oz of wine, or 1 oz of hard liquor.  Try to get 7-9 hours of sleep each night. What other changes can be made?  Keep a food and activity journal to keep track of: ? What you ate and how many calories you had. Remember to count sauces, dressings, and side dishes. ? Whether you were active, and what exercises you did. ? Your calorie, weight, and activity goals.  Check your weight regularly. Track any changes. If you notice you have gained weight, make changes to your diet or activity routine.  Avoid taking weight-loss medicines or supplements. Talk to your health care provider before starting any new medicine or supplement.  Talk to your health care provider before trying any new diet or exercise plan. Why are these changes important? Eating healthy, staying active, and having healthy habits not only help prevent obesity, they also:  Help you to manage stress and emotions.  Help you to connect with friends and family.  Improve your self-esteem.  Improve your sleep.  Prevent long-term health problems.  What can happen if changes are not made? Being obese or overweight can cause you to develop joint or bone problems, which can make it hard for you to stay active or do activities you enjoy. Being obese or overweight also puts stress on your heart and lungs and can lead to health problems like diabetes, heart disease, and some cancers. Where to find more information: Talk with your health care provider or a dietitian about healthy eating and healthy lifestyle choices.  You may also find other information through these resources:  U.S. Department of Agriculture MyPlate: FormerBoss.no  American Heart Association: www.heart.org  Centers for Disease Control and Prevention: http://www.wolf.info/  Summary  Staying at a healthy weight is important. It helps prevent certain diseases and health problems, such as heart disease, diabetes, joint problems, sleep disorders, and some cancers.  Being obese or overweight can cause you to develop joint or bone problems, which can make it hard for you to stay active or do activities you enjoy.  You can prevent unhealthy weight gain by eating a healthy diet, exercising regularly, not smoking, limiting alcohol, and getting enough sleep.  Talk with your health care provider or a dietitian for guidance about healthy eating and healthy lifestyle choices. This information is not intended to replace advice given to you by your health care provider. Make sure you discuss any questions you have with your health care provider. Document Released: 11/08/2016 Document Revised: 12/14/2016 Document Reviewed: 12/14/2016 Elsevier Interactive Patient Education  [employer].

## 2018-05-09 NOTE — Assessment & Plan Note (Signed)
Check A1c, microalb:Cr; foot exam by MD; eye exam yearly

## 2018-05-09 NOTE — Progress Notes (Signed)
BP 116/82   Pulse 97   Temp 98 F (36.7 C) (Oral)   Resp 12   Ht 5\' 3"  (1.6 m)   Wt 196 lb 4.8 oz (89 kg)   LMP 04/25/2018   SpO2 96%   BMI 34.77 kg/m    Subjective:    Patient ID: Julie Marquez, female    DOB: 24-May-1971, 47 y.o.   MRN: [id number]  HPI: VENDELA TROUNG is a 47 y.o. female  Chief Complaint  Patient presents with  . New Patient (Initial Visit)  . Medication Refill    HPI Here to establish care Type 2 diabetes mellitus; diagnosed about 3 years ago; on metformin BID; working well, "fine"; needs prescriptions for lancets and test strips;  She is using [employer] program  She has hypothyroidism; she is on 25 mcg, not 88 mcg; switched to 25 about a year and stable They did decrease her gradually, not the big drop; sister had hyperthyroidism  Hypertension; on CCB; was on lisinopril, but she took herself off; was having coughing spells and then peeing from that; she took herself off of it  Taking citalopram for anxiety and depression; not seeing psychiatrist or counselor; struggled with it all her life; large groups, talking in front of people, being unfamiliar with situations, comes and goes; sometimes just a blank out; not sure if inherited; she has Xanax 1 mg on her med list; does not use but every blue moon, not something she thinks she needs to keep; can live without it  Taking flexeril for shoulder pain; she works with patients at the hospital, lots of tension in the shoulder; drinks alcohol just once in a while, knows to not mix Xanax or flexeril with alcohol  Not taking omeprazole  Tobacco abuse; 6 cigs a day; not around smokers; smokes when stressed and anxious  She jerks when she goes to sleep; uncontrolled tics like Tourettes at night; as she is going to sleep, she yell out; going on for about a year or longer; wonders if sleep apnea; not super refreshed in the morning; very good mattress  Having physical later today; hoping to get labs done  there  Depression screen PHQ 2/9 05/09/2018  Decreased Interest 0  Down, Depressed, Hopeless 1  PHQ - 2 Score 1  Altered sleeping 0  Tired, decreased energy 3  Change in appetite 0  Feeling bad or failure about yourself  0  Trouble concentrating 2  Moving slowly or fidgety/restless 2  Suicidal thoughts 0  PHQ-9 Score 8  Difficult doing work/chores Somewhat difficult    Relevant past medical, surgical, family and social history reviewed Past Medical History:  Diagnosis Date  . Anxiety   . Depression   . Diabetes mellitus without complication ([place])   . Fatigue   . Hypertension   . Hypothyroidism   . Panic attack   . Vitamin D deficiency    Past Surgical History:  Procedure Laterality Date  . THYROIDECTOMY  2005   Family History  Problem Relation Age of Onset  . Heart disease Father   . Cancer Maternal Grandmother        breast  . Cancer Paternal Grandmother        breast  . Dementia Mother   . Thyroid disease Sister    Social History   Tobacco Use  . Smoking status: Current Every Day Smoker    Packs/day: 0.25    Years: 20.00    Pack years: 5.00  Types: Cigarettes  . Smokeless tobacco: Never Used  Substance Use Topics  . Alcohol use: Yes    Comment: occas  . Drug use: No    Interim medical history since last visit reviewed. Allergies and medications reviewed  Review of Systems Per HPI unless specifically indicated above     Objective:    BP 116/82   Pulse 97   Temp 98 F (36.7 C) (Oral)   Resp 12   Ht 5\' 3"  (1.6 m)   Wt 196 lb 4.8 oz (89 kg)   LMP 04/25/2018   SpO2 96%   BMI 34.77 kg/m   Wt Readings from Last 3 Encounters:  05/09/18 196 lb 4.8 oz (89 kg)  12/28/17 197 lb (89.4 kg)  04/20/16 202 lb 9.6 oz (91.9 kg)    Physical Exam  Constitutional: She appears well-developed and well-nourished. No distress.  HENT:  Head: Normocephalic and atraumatic.  Eyes: EOM are normal. No scleral icterus.  Neck: No thyromegaly present.    Cardiovascular: Normal rate, regular rhythm and normal heart sounds.  No murmur heard. Pulmonary/Chest: Effort normal and breath sounds normal. No respiratory distress. She has no wheezes.  Abdominal: Soft. Bowel sounds are normal. She exhibits no distension.  Musculoskeletal: Normal range of motion. She exhibits no edema.  Neurological: She is alert. She exhibits normal muscle tone.  Skin: Skin is warm and dry. She is not diaphoretic. No pallor.  Psychiatric: She has a normal mood and affect. Her behavior is normal. Judgment and thought content normal.   Diabetic Foot Form - Detailed   Diabetic Foot Exam - detailed Diabetic Foot exam was performed with the following findings:  Yes 05/09/2018 10:10 AM  Visual Foot Exam completed.:  Yes  Pulse Foot Exam completed.:  Yes  Right Dorsalis Pedis:  Present Left Dorsalis Pedis:  Present  Sensory Foot Exam Completed.:  Yes Semmes-Weinstein Monofilament Test R Site 1-Great Toe:  Pos L Site 1-Great Toe:  Pos        Results for orders placed or performed in visit on 08/08/16  POCT Urinalysis Dipstick (CPT 81002)  Result Value Ref Range   Color, UA negyellow    Clarity, UA clear    Glucose, UA neg    Bilirubin, UA neg    Ketones, UA neg    Spec Grav, UA 1.020    Blood, UA 2+    pH, UA 7.0    Protein, UA 2+    Urobilinogen, UA 1.0    Nitrite, UA neg    Leukocytes, UA Trace (A) Negative  POCT Urine Pregnancy (CPT 81025)  Result Value Ref Range   Preg Test, Ur Negative Negative      Assessment & Plan:   Problem List Items Addressed This Visit      Cardiovascular and Mediastinum   Essential hypertension, benign (Chronic)    Didn't tolerate ACE-I (cough); doing well on CCB      Relevant Medications   atorvastatin (LIPITOR) 20 MG tablet     Digestive   Fatty liver (Chronic)    On [place]; recommended weight loss, diet low in saturated fats      Relevant Orders   COMPLETE METABOLIC PANEL WITH GFR     Endocrine   Hypothyroid  (Chronic)    Check TSH and adjust if needed      Relevant Medications   levothyroxine (SYNTHROID, LEVOTHROID) 25 MCG tablet   Other Relevant Orders   TSH   T4, free   DM (diabetes mellitus),  type 2, uncontrolled (HCC) (Chronic)    Check A1c, microalb:Cr; foot exam by MD; eye exam yearly      Relevant Medications   atorvastatin (LIPITOR) 20 MG tablet   RESOLVED: Diabetes mellitus without complication (HCC)   Relevant Medications   atorvastatin (LIPITOR) 20 MG tablet   RESOLVED: Diabetes ([place]) - Primary   Relevant Medications   atorvastatin (LIPITOR) 20 MG tablet   Other Relevant Orders   Hemoglobin A1C   Urine Microalbumin w/creat. ratio   Lipid panel     Other   Tobacco abuse (Chronic)    Encouraged cessation      Nocturnal myoclonus    Refer to neurology sleep specialist      Relevant Orders   Ambulatory referral to Neurology   Neck pain on right side    Occasional use of flexeril; advised to never combine with alcohol or xanax      Medication monitoring encounter    Check sgpt and Cr       Other Visit Diagnoses    Screening for breast cancer       mammogram ordered   Relevant Orders   MM Digital Screening   Other microscopic hematuria       discovered on lab review from 2017; recheck today   Relevant Orders   Urinalysis w microscopic + reflex cultur   Proteinuria, unspecified type       discovered on lab review from 2017; recheck today   Relevant Orders   Urinalysis w microscopic + reflex cultur       Follow up plan: Return in about 3 months (around 08/09/2018) for follow-up visit with Dr. Sanda Klein.  An after-visit summary was printed and given to the patient at [place].  Please see the patient instructions which may contain other information and recommendations beyond what is mentioned above in the assessment and plan.  No orders of the defined types were placed in this encounter.   Orders Placed This Encounter  Procedures  . MM Digital  Screening  . Hemoglobin A1C  . Urine Microalbumin w/creat. ratio  . TSH  . COMPLETE METABOLIC PANEL WITH GFR  . Lipid panel  . T4, free  . Urinalysis w microscopic + reflex cultur  . Ambulatory referral to Neurology

## 2018-05-09 NOTE — Assessment & Plan Note (Signed)
Check TSH and adjust if needed

## 2018-05-09 NOTE — Assessment & Plan Note (Signed)
Check sgpt and Cr

## 2018-05-12 LAB — URINALYSIS W MICROSCOPIC + REFLEX CULTURE
BACTERIA UA: NONE SEEN /HPF
Bilirubin Urine: NEGATIVE
Glucose, UA: NEGATIVE
HGB URINE DIPSTICK: NEGATIVE
Hyaline Cast: NONE SEEN /LPF
KETONES UR: NEGATIVE
Nitrites, Initial: NEGATIVE
Protein, ur: NEGATIVE
RBC / HPF: NONE SEEN /HPF (ref 0–2)
Specific Gravity, Urine: 1.016 (ref 1.001–1.03)
pH: 7.5 (ref 5.0–8.0)

## 2018-05-12 LAB — URINE CULTURE
MICRO NUMBER:: 90744892
SPECIMEN QUALITY:: ADEQUATE

## 2018-05-12 LAB — COMPLETE METABOLIC PANEL WITH GFR
AG Ratio: 1.7 (calc) (ref 1.0–2.5)
ALT: 21 U/L (ref 6–29)
AST: 16 U/L (ref 10–35)
Albumin: 4.3 g/dL (ref 3.6–5.1)
Alkaline phosphatase (APISO): 88 U/L (ref 33–115)
BUN: 14 mg/dL (ref 7–25)
CALCIUM: 9.3 mg/dL (ref 8.6–10.2)
CO2: 28 mmol/L (ref 20–32)
CREATININE: 0.7 mg/dL (ref 0.50–1.10)
Chloride: 102 mmol/L (ref 98–110)
GFR, EST NON AFRICAN AMERICAN: 103 mL/min/{1.73_m2} (ref >=60)
GFR, Est African American: 120 mL/min/{1.73_m2} (ref >=60)
Globulin: 2.6 g/dL (calc) (ref 1.9–3.7)
Glucose, Bld: 115 mg/dL (ref 65–139)
POTASSIUM: 4.3 mmol/L (ref 3.5–5.3)
Sodium: 138 mmol/L (ref 135–146)
Total Bilirubin: 0.4 mg/dL (ref 0.2–1.2)
Total Protein: 6.9 g/dL (ref 6.1–8.1)

## 2018-05-12 LAB — HEMOGLOBIN A1C
Hgb A1c MFr Bld: 5.5 % of total Hgb (ref <5.7)
MEAN PLASMA GLUCOSE: 111 (calc)
eAG (mmol/L): 6.2 (calc)

## 2018-05-12 LAB — LIPID PANEL
CHOL/HDL RATIO: 3.5 (calc) (ref <5.0)
Cholesterol: 167 mg/dL (ref <200)
HDL: 48 mg/dL — AB (ref >50)
LDL Cholesterol (Calc): 91 mg/dL (calc)
NON-HDL CHOLESTEROL (CALC): 119 mg/dL (ref <130)
TRIGLYCERIDES: 186 mg/dL — AB (ref <150)

## 2018-05-12 LAB — MICROALBUMIN / CREATININE URINE RATIO
Creatinine, Urine: 66 mg/dL (ref 20–275)
MICROALB/CREAT RATIO: 33 ug/mg{creat} — AB (ref <30)
Microalb, Ur: 2.2 mg/dL

## 2018-05-12 LAB — T4, FREE: Free T4: 1.2 ng/dL (ref 0.8–1.8)

## 2018-05-12 LAB — CULTURE INDICATED

## 2018-05-12 LAB — TSH: TSH: 0.74 m[IU]/L

## 2018-05-14 ENCOUNTER — Encounter: Payer: Self-pay | Admitting: Family Medicine

## 2018-05-14 DIAGNOSIS — R809 Proteinuria, unspecified: Secondary | ICD-10-CM | POA: Insufficient documentation

## 2018-05-14 DIAGNOSIS — E119 Type 2 diabetes mellitus without complications: Secondary | ICD-10-CM

## 2018-05-15 MED ORDER — GLUCOSE BLOOD VI STRP: ORAL_STRIP | 0 refills | Status: DC

## 2018-05-15 MED ORDER — RELION LANCETS ULTRA-THIN 30G MISC: 0 refills | Status: DC

## 2018-05-16 ENCOUNTER — Other Ambulatory Visit: Payer: Self-pay

## 2018-05-16 MED ORDER — LEVOTHYROXINE SODIUM 25 MCG PO TABS: 25.0000 ug | ORAL_TABLET | Freq: Every day | ORAL | 11 refills | Status: DC

## 2018-05-16 NOTE — Telephone Encounter (Signed)
Lab Results  Component Value Date   TSH 0.74 05/10/2018   rx approved x 12 months

## 2018-06-04 ENCOUNTER — Encounter: Payer: Self-pay | Admitting: Family Medicine

## 2018-06-04 MED ORDER — AMLODIPINE BESYLATE 5 MG PO TABS: 5.0000 mg | ORAL_TABLET | Freq: Every day | ORAL | 1 refills | Status: DC

## 2018-07-19 ENCOUNTER — Encounter: Payer: Self-pay | Admitting: Family Medicine

## 2018-07-27 ENCOUNTER — Other Ambulatory Visit: Payer: Self-pay

## 2018-07-27 MED ORDER — ATORVASTATIN CALCIUM 20 MG PO TABS: 20.0000 mg | ORAL_TABLET | Freq: Every day | ORAL | 0 refills | Status: DC

## 2018-07-27 MED ORDER — METFORMIN HCL ER 500 MG PO TB24: 500.0000 mg | ORAL_TABLET | Freq: Two times a day (BID) | ORAL | 4 refills | Status: DC

## 2018-07-27 NOTE — Telephone Encounter (Signed)
Last OV: 05/09/18 Next OV: 08/09/18

## 2018-08-09 ENCOUNTER — Ambulatory Visit (INDEPENDENT_AMBULATORY_CARE_PROVIDER_SITE_OTHER): Payer: No Typology Code available for payment source | Admitting: Family Medicine

## 2018-08-09 ENCOUNTER — Encounter: Payer: Self-pay | Admitting: Family Medicine

## 2018-08-09 VITALS — BP 138/82 | HR 93 | Temp 98.3°F | Ht 63.0 in | Wt 197.0 lb

## 2018-08-09 DIAGNOSIS — E1129 Type 2 diabetes mellitus with other diabetic kidney complication: Secondary | ICD-10-CM | POA: Diagnosis not present

## 2018-08-09 DIAGNOSIS — Z124 Encounter for screening for malignant neoplasm of cervix: Secondary | ICD-10-CM

## 2018-08-09 DIAGNOSIS — Z720 Tobacco use: Secondary | ICD-10-CM

## 2018-08-09 DIAGNOSIS — Z5181 Encounter for therapeutic drug level monitoring: Secondary | ICD-10-CM

## 2018-08-09 DIAGNOSIS — E038 Other specified hypothyroidism: Secondary | ICD-10-CM | POA: Diagnosis not present

## 2018-08-09 DIAGNOSIS — Z23 Encounter for immunization: Secondary | ICD-10-CM | POA: Diagnosis not present

## 2018-08-09 DIAGNOSIS — Z1231 Encounter for screening mammogram for malignant neoplasm of breast: Secondary | ICD-10-CM

## 2018-08-09 DIAGNOSIS — K760 Fatty (change of) liver, not elsewhere classified: Secondary | ICD-10-CM

## 2018-08-09 DIAGNOSIS — I10 Essential (primary) hypertension: Secondary | ICD-10-CM

## 2018-08-09 DIAGNOSIS — R809 Proteinuria, unspecified: Secondary | ICD-10-CM

## 2018-08-09 DIAGNOSIS — E669 Obesity, unspecified: Secondary | ICD-10-CM | POA: Insufficient documentation

## 2018-08-09 DIAGNOSIS — N393 Stress incontinence (female) (male): Secondary | ICD-10-CM

## 2018-08-09 DIAGNOSIS — Z6834 Body mass index (BMI) 34.0-34.9, adult: Secondary | ICD-10-CM | POA: Insufficient documentation

## 2018-08-09 DIAGNOSIS — Z1239 Encounter for other screening for malignant neoplasm of breast: Secondary | ICD-10-CM

## 2018-08-09 MED ORDER — LOSARTAN POTASSIUM 50 MG PO TABS: 50.0000 mg | ORAL_TABLET | Freq: Every day | ORAL | 0 refills | Status: DC

## 2018-08-09 NOTE — Assessment & Plan Note (Addendum)
Limit NSAIDS; avoid decongestants; stop the CCB and recheck BP and BMP after starting ARB which will also yield renal protection; avoid salt subst and fruit smoothies

## 2018-08-09 NOTE — Assessment & Plan Note (Signed)
Working on weight loss; last LFTs normal

## 2018-08-09 NOTE — Progress Notes (Signed)
BP 138/82   Pulse 93   Temp 98.3 F (36.8 C) (Oral)   Ht 5\' 3"  (1.6 m)   Wt 197 lb (89.4 kg)   LMP 08/06/2018   SpO2 99%   BMI 34.90 kg/m    Subjective:    Patient ID: Julie Marquez, female    DOB: February 23, 1971, 47 y.o.   MRN: [id number]  HPI: Julie Marquez is a 47 y.o. female  Chief Complaint  Patient presents with  . Follow-up    HPI Patient is here for f/u She has type 2 DM; checks FSBS 2x a week; getting 75 or so; tries to watch but could do better with her diet;  Working 12 hours shifts is an obstacle; we talked about overcoming obstacles; she can have healthy choices at work, then she comes home to have healthy meals with family; evening meals are usually take out Urine microalbumin:Cr was 33 last check in June; last cmp normal Taking metformin  Lab Results  Component Value Date   HGBA1C 5.5 05/10/2018   HTN; not quite to goal; adds a little bit of salt; no decongestants; using NSAIDs occasionally; allergic to lisinopril; on CCB  Hypothyroidism; low energy is interfering with exercise; she wants to exercise and has equipment in her home; most energy in the evenings but thinks it would be best to start the day in the mornings, but just feels like she doesn't have the energy Thyroid labs recently normal; vit D two years ago was normal; gets some sunshine  Obesity; we talked about struggles  Dyslipidemia; TG high for a diabetic at 186 last check, down from 196 though; HDL too low at 48, but up from 47; taking atorvastatin 20 mg  She got her flu shot today  Tobacco abuse; smoking 8 cigarettes a day; down from 10 a day; thinking about it  Urine incontinence with coughing or sneezing; had a 10 pound baby; going on for years  Dark spot on upper lip LEFT side; watch and notify me if getting darker or larger for referral to [facility]  Depression screen [facility] 2/9 08/09/2018 05/09/2018  Decreased Interest 0 0  Down, Depressed, Hopeless 0 1  PHQ - 2 Score 0 1  Altered  sleeping 0 0  Tired, decreased energy 3 3  Change in appetite 1 0  Feeling bad or failure about yourself  0 0  Trouble concentrating 3 2  Moving slowly or fidgety/restless 0 2  Suicidal thoughts 0 0  PHQ-9 Score 7 8  Difficult doing work/chores Not difficult at all Somewhat difficult    Relevant past medical, surgical, family and social history reviewed Past Medical History:  Diagnosis Date  . Anxiety   . Depression   . Diabetes mellitus without complication ([place])   . Fatigue   . Hypertension   . Hypothyroidism   . Panic attack   . Vitamin D deficiency    Past Surgical History:  Procedure Laterality Date  . THYROIDECTOMY  2005   Family History  Problem Relation Age of Onset  . Heart disease Father   . Cancer Maternal Grandmother        breast  . Cancer Paternal Grandmother        breast  . Dementia Mother   . Thyroid disease Sister    Social History   Tobacco Use  . Smoking status: Current Every Day Smoker    Packs/day: 0.25    Years: 20.00    Pack years: 5.00  Types: Cigarettes  . Smokeless tobacco: Never Used  Substance Use Topics  . Alcohol use: Yes    Comment: occas  . Drug use: No    Interim medical history since last visit reviewed. Allergies and medications reviewed  Review of Systems Per HPI unless specifically indicated above     Objective:    BP 138/82   Pulse 93   Temp 98.3 F (36.8 C) (Oral)   Ht 5\' 3"  (1.6 m)   Wt 197 lb (89.4 kg)   LMP 08/06/2018   SpO2 99%   BMI 34.90 kg/m   Wt Readings from Last 3 Encounters:  08/09/18 197 lb (89.4 kg)  05/09/18 196 lb (88.9 kg)  05/09/18 196 lb 4.8 oz (89 kg)    Physical Exam  Constitutional: She appears well-developed and well-nourished. No distress.  obese  HENT:  Head: Normocephalic and atraumatic.  Eyes: EOM are normal. No scleral icterus.  Neck: No thyromegaly present.  Cardiovascular: Normal rate, regular rhythm and normal heart sounds.  No murmur heard. Pulmonary/Chest:  Effort normal and breath sounds normal. No respiratory distress. She has no wheezes.  Abdominal: Soft. Bowel sounds are normal. She exhibits no distension.  Musculoskeletal: She exhibits no edema.  Neurological: She is alert.  Skin: Skin is warm and dry. Lesion (light brown macular skin coloration on LEFT side upper lip) noted. She is not diaphoretic. No pallor.  Psychiatric: She has a normal mood and affect. Judgment normal. Her mood appears not anxious. She does not exhibit a depressed mood.   Diabetic Foot Form - Detailed   Diabetic Foot Exam - detailed Diabetic Foot exam was performed with the following findings:  Yes 08/09/2018 10:53 AM  Visual Foot Exam completed.:  Yes  Pulse Foot Exam completed.:  Yes  Right Dorsalis Pedis:  Present Left Dorsalis Pedis:  Present  Sensory Foot Exam Completed.:  Yes Semmes-Weinstein Monofilament Test R Site 1-Great Toe:  Pos L Site 1-Great Toe:  Pos        Results for orders placed or performed in visit on 07/17/18  HM DIABETES EYE EXAM  Result Value Ref Range   HM Diabetic Eye Exam No Retinopathy No Retinopathy      Assessment & Plan:   Problem List Items Addressed This Visit      Cardiovascular and Mediastinum   Essential hypertension, benign (Chronic)    Limit NSAIDS; avoid decongestants; stop the CCB and recheck BP and BMP after starting ARB which will also yield renal protection; avoid salt subst and fruit smoothies      Relevant Medications   losartan (COZAAR) 50 MG tablet     Digestive   Fatty liver (Chronic)    Working on weight loss; last LFTs normal        Endocrine   Microalbuminuria due to type 2 diabetes mellitus ([place])    Recheck urine today      Relevant Medications   losartan (COZAAR) 50 MG tablet   Hypothyroid (Chronic)    Last thyroid check normal      Diabetes mellitus with microalbuminuria (HCC) - Primary   Relevant Medications   losartan (COZAAR) 50 MG tablet   Other Relevant Orders   Microalbumin /  creatinine urine ratio     Other   Tobacco abuse (Chronic)    I am here to help if/when committed; keep thinking and considering and planning      Obesity (BMI 30.0-34.9)    Try to lose half of a pound a week  for 3 months, 6.5 pounds; consider tracking steps, try to incorporate exercise, start slow and build up gradually      Medication monitoring encounter    Check BMP in 10-14 days on the new ARB       Other Visit Diagnoses    Stress incontinence of urine       Relevant Orders   Ambulatory referral to Obstetrics / Gynecology   Cervical cancer screening       Relevant Orders   Ambulatory referral to Obstetrics / Gynecology   Screening for breast cancer       Relevant Orders   MM 3D SCREEN BREAST BILATERAL   Need for influenza vaccination       Relevant Orders   Flu Vaccine QUAD 6+ mos PF IM (Fluarix Quad PF) (Completed)       Follow up plan: Return in about 12 days (around 08/21/2018) for blood pressure recheck and BMP with CMA (10 to 14 days from now); 3 months with Dr. Sanda Klein.  An after-visit summary was printed and given to the patient at [place].  Please see the patient instructions which may contain other information and recommendations beyond what is mentioned above in the assessment and plan.  Meds ordered this encounter  Medications  . losartan (COZAAR) 50 MG tablet    Sig: Take 1 tablet (50 mg total) by mouth daily. For kidney protection and blood pressure    Dispense:  90 tablet    Refill:  0    STOPPING amlodipine    Orders Placed This Encounter  Procedures  . MM 3D SCREEN BREAST BILATERAL  . Flu Vaccine QUAD 6+ mos PF IM (Fluarix Quad PF)  . Microalbumin / creatinine urine ratio  . Ambulatory referral to Obstetrics / Gynecology

## 2018-08-09 NOTE — Assessment & Plan Note (Signed)
Recheck urine today  

## 2018-08-09 NOTE — Assessment & Plan Note (Signed)
Check BMP in 10-14 days on the new ARB

## 2018-08-09 NOTE — Patient Instructions (Addendum)
Trouble shoot with your family to consider ways to incorporate healthier meals in the evening  Check out the information at familydoctor.org entitled "Nutrition for Weight Loss: What You Need to Know about Fad Diets" Try to lose between 1-2 pounds per week by taking in fewer calories and burning off more calories You can succeed by limiting portions, limiting foods dense in calories and fat, becoming more active, and drinking 8 glasses of water a day (64 ounces) Don't skip meals, especially breakfast, as skipping meals may alter your metabolism Do not use over-the-counter weight loss pills or gimmicks that claim rapid weight loss A healthy BMI (or body mass index) is between 18.5 and 24.9 You can calculate your ideal BMI at the [place] website ClubMonetize.fr  I do encourage you to quit smoking Call [phone] to sign up for smoking cessation classes You can call 1-800-QUIT-NOW to talk with a smoking cessation coach  Try sublingual vitamin B12 250 or 500 mcg daily for energy If that doesn't help, let me know and we'll do lab  STOP amlodipine START losartan Return in 2 weeks for blood pressure recheck and BMP draw Avoid fruit smoothies Avoid salt substitutes

## 2018-08-09 NOTE — Assessment & Plan Note (Signed)
Try to lose half of a pound a week for 3 months, 6.5 pounds; consider tracking steps, try to incorporate exercise, start slow and build up gradually

## 2018-08-09 NOTE — Assessment & Plan Note (Signed)
I am here to help if/when committed; keep thinking and considering and planning

## 2018-08-09 NOTE — Assessment & Plan Note (Signed)
Last thyroid check normal

## 2018-08-10 LAB — MICROALBUMIN / CREATININE URINE RATIO
Creatinine, Urine: 45 mg/dL (ref 20–275)
MICROALB/CREAT RATIO: 91 ug/mg{creat} — AB (ref <30)
Microalb, Ur: 4.1 mg/dL

## 2018-08-20 ENCOUNTER — Encounter: Payer: Self-pay | Admitting: Family Medicine

## 2018-08-20 MED ORDER — LOSARTAN POTASSIUM 50 MG PO TABS: 50.0000 mg | ORAL_TABLET | Freq: Every day | ORAL | 0 refills | Status: DC

## 2018-08-23 ENCOUNTER — Encounter: Payer: Self-pay | Admitting: Family Medicine

## 2018-10-29 ENCOUNTER — Telehealth: Payer: Self-pay | Admitting: Nurse Practitioner

## 2018-10-29 NOTE — Telephone Encounter (Signed)
Patient was due for a BMP after we started the losartan I don't see any future visits in the chart She was due for an appt in September  Please ask her to schedule a visit ASAP with me or Benjamine Mola and we'll get labs for lipids, kidney function, urine microalbumin:Cr; thank you

## 2018-10-30 ENCOUNTER — Encounter: Payer: Self-pay | Admitting: Family Medicine

## 2018-10-30 DIAGNOSIS — F630 Pathological gambling: Secondary | ICD-10-CM

## 2018-10-30 LAB — BASIC METABOLIC PANEL
BUN: 11 mg/dL (ref 7–25)
CALCIUM: 9.8 mg/dL (ref 8.6–10.2)
CO2: 24 mmol/L (ref 20–32)
Chloride: 104 mmol/L (ref 98–110)
Creat: 0.7 mg/dL (ref 0.50–1.10)
GLUCOSE: 117 mg/dL — AB (ref 65–99)
Potassium: 4.2 mmol/L (ref 3.5–5.3)
SODIUM: 137 mmol/L (ref 135–146)

## 2018-10-30 NOTE — Telephone Encounter (Signed)
Spoke with patient and she will call back to schedule the appt

## 2018-11-02 ENCOUNTER — Ambulatory Visit: Payer: No Typology Code available for payment source | Admitting: Family Medicine

## 2018-11-26 ENCOUNTER — Encounter: Payer: Self-pay | Admitting: Family Medicine

## 2018-12-03 ENCOUNTER — Encounter: Payer: Self-pay | Admitting: Family Medicine

## 2018-12-03 ENCOUNTER — Ambulatory Visit (INDEPENDENT_AMBULATORY_CARE_PROVIDER_SITE_OTHER): Payer: No Typology Code available for payment source | Admitting: Family Medicine

## 2018-12-03 VITALS — BP 116/70 | HR 96 | Temp 98.5°F | Ht 63.0 in | Wt 198.7 lb

## 2018-12-03 DIAGNOSIS — E1129 Type 2 diabetes mellitus with other diabetic kidney complication: Secondary | ICD-10-CM

## 2018-12-03 DIAGNOSIS — K760 Fatty (change of) liver, not elsewhere classified: Secondary | ICD-10-CM | POA: Diagnosis not present

## 2018-12-03 DIAGNOSIS — Z720 Tobacco use: Secondary | ICD-10-CM

## 2018-12-03 DIAGNOSIS — R413 Other amnesia: Secondary | ICD-10-CM

## 2018-12-03 DIAGNOSIS — E038 Other specified hypothyroidism: Secondary | ICD-10-CM

## 2018-12-03 DIAGNOSIS — M542 Cervicalgia: Secondary | ICD-10-CM

## 2018-12-03 DIAGNOSIS — I10 Essential (primary) hypertension: Secondary | ICD-10-CM

## 2018-12-03 DIAGNOSIS — F630 Pathological gambling: Secondary | ICD-10-CM | POA: Insufficient documentation

## 2018-12-03 DIAGNOSIS — R809 Proteinuria, unspecified: Secondary | ICD-10-CM

## 2018-12-03 MED ORDER — CYCLOBENZAPRINE HCL 10 MG PO TABS: 10.0000 mg | ORAL_TABLET | Freq: Three times a day (TID) | ORAL | 2 refills | Status: DC | PRN

## 2018-12-03 MED ORDER — FLUOXETINE HCL 20 MG PO CAPS: 20.0000 mg | ORAL_CAPSULE | Freq: Every day | ORAL | 0 refills | Status: DC

## 2018-12-03 MED ORDER — LEVOTHYROXINE SODIUM 25 MCG PO TABS: 25.0000 ug | ORAL_TABLET | Freq: Every day | ORAL | 0 refills | Status: DC

## 2018-12-03 MED ORDER — ATORVASTATIN CALCIUM 20 MG PO TABS: 20.0000 mg | ORAL_TABLET | Freq: Every day | ORAL | 2 refills | Status: DC

## 2018-12-03 MED ORDER — FLUTICASONE PROPIONATE 50 MCG/ACT NA SUSP: 2.0000 | Freq: Every day | NASAL | 3 refills | Status: DC | PRN

## 2018-12-03 MED ORDER — LOSARTAN POTASSIUM 50 MG PO TABS: 50.0000 mg | ORAL_TABLET | Freq: Every day | ORAL | 1 refills | Status: DC

## 2018-12-03 MED ORDER — METFORMIN HCL ER 500 MG PO TB24: 500.0000 mg | ORAL_TABLET | Freq: Two times a day (BID) | ORAL | 5 refills | Status: DC

## 2018-12-03 NOTE — Assessment & Plan Note (Signed)
Check TSH; adjust medicine if needed; if normal, consider referral to neurologist

## 2018-12-03 NOTE — Assessment & Plan Note (Signed)
Support offered; referred to psychiatrist; number given so she can schedule an appt; start SSRI

## 2018-12-03 NOTE — Assessment & Plan Note (Signed)
Encouraged modest weight loss

## 2018-12-03 NOTE — Assessment & Plan Note (Signed)
Gorgeous! Continue medicine

## 2018-12-03 NOTE — Assessment & Plan Note (Signed)
Now is not a good time to quit she says and I am here to help when the day comes

## 2018-12-03 NOTE — Patient Instructions (Addendum)
Check out the information at [email or web address].org entitled "Nutrition for Weight Loss: What You Need to Know about Fad Diets" Try to lose between 1-2 pounds per week by taking in fewer calories and burning off more calories You can succeed by limiting portions, limiting foods dense in calories and fat, becoming more active, and drinking 8 glasses of water a day (64 ounces) Don't skip meals, especially breakfast, as skipping meals may alter your metabolism Do not use over-the-counter weight loss pills or gimmicks that claim rapid weight loss A healthy BMI (or body mass index) is between 18.5 and 24.9 You can calculate your ideal BMI at the [place] website ClubMonetize.fr  I do encourage you to quit smoking Call [phone] to sign up for smoking cessation classes You can call 1-800-QUIT-NOW to talk with a smoking cessation coach  Please call: Phone: [phone] to schedule an appointment to see a psychiatrist  Please do call to schedule your mammogram; the number to schedule one at either [facility] or [facility] is [phone]   Obesity, Adult Obesity is the condition of having too much total body fat. Being overweight or obese means that your weight is greater than what is considered healthy for your body size. Obesity is determined by a measurement called BMI. BMI is an estimate of body fat and is calculated from height and weight. For adults, a BMI of 30 or higher is considered obese. Obesity can eventually lead to other health concerns and major illnesses, including:  Stroke.  Coronary artery disease (CAD).  Type 2 diabetes.  Some types of cancer, including cancers of the colon, breast, uterus, and gallbladder.  Osteoarthritis.  High blood pressure (hypertension).  High cholesterol.  Sleep apnea.  Gallbladder stones.  Infertility problems. What are the causes? The main cause of obesity is  taking in (consuming) more calories than your body uses for energy. Other factors that contribute to this condition may include:  Being born with genes that make you more likely to become obese.  Having a medical condition that causes obesity. These conditions include: ? Hypothyroidism. ? Polycystic ovarian syndrome (PCOS). ? Binge-eating disorder. ? Cushing syndrome.  Taking certain medicines, such as steroids, antidepressants, and seizure medicines.  Not being physically active (sedentary lifestyle).  Living where there are limited places to exercise safely or buy healthy foods.  Not getting enough sleep. What increases the risk? The following factors may increase your risk of this condition:  Having a family history of obesity.  Being a woman of African-American descent.  Being a man of Hispanic descent. What are the signs or symptoms? Having excessive body fat is the main symptom of this condition. How is this diagnosed? This condition may be diagnosed based on:  Your symptoms.  Your medical history.  A physical exam. Your health care provider may measure: ? Your BMI. If you are an adult with a BMI between 25 and less than 30, you are considered overweight. If you are an adult with a BMI of 30 or higher, you are considered obese. ? The distances around your hips and your waist (circumferences). These may be compared to each other to help diagnose your condition. ? Your skinfold thickness. Your health care provider may gently pinch a fold of your skin and measure it. How is this treated? Treatment for this condition often includes changing your lifestyle. Treatment may include some or all of the following:  Dietary changes. Work with your health care provider and a dietitian to set  a weight-loss goal that is healthy and reasonable for you. Dietary changes may include eating: ? Smaller portions. A portion size is the amount of a particular food that is healthy for you to  eat at one time. This varies from person to person. ? Low-calorie or low-fat options. ? More whole grains, fruits, and vegetables.  Regular physical activity. This may include aerobic activity (cardio) and strength training.  Medicine to help you lose weight. Your health care provider may prescribe medicine if you are unable to lose 1 pound a week after 6 weeks of eating more healthily and doing more physical activity.  Surgery. Surgical options may include gastric banding and gastric bypass. Surgery may be done if: ? Other treatments have not helped to improve your condition. ? You have a BMI of 40 or higher. ? You have life-threatening health problems related to obesity. Follow these instructions at home:  Eating and drinking   Follow recommendations from your health care provider about what you eat and drink. Your health care provider may advise you to: ? Limit fast foods, sweets, and processed snack foods. ? Choose low-fat options, such as low-fat milk instead of whole milk. ? Eat 5 or more servings of fruits or vegetables every day. ? Eat at home more often. This gives you more control over what you eat. ? Choose healthy foods when you eat out. ? Learn what a healthy portion size is. ? Keep low-fat snacks on hand. ? Avoid sugary drinks, such as soda, fruit juice, iced tea sweetened with sugar, and flavored milk. ? Eat a healthy breakfast.  Drink enough water to keep your urine clear or pale yellow.  Do not go without eating for long periods of time (do not fast) or follow a fad diet. Fasting and fad diets can be unhealthy and even dangerous. Physical Activity  Exercise regularly, as told by your health care provider. Ask your health care provider what types of exercise are safe for you and how often you should exercise.  Warm up and stretch before being active.  Cool down and stretch after being active.  Rest between periods of activity. Lifestyle  Limit the time that  you spend in front of your TV, computer, or video game system.  Find ways to reward yourself that do not involve food.  Limit alcohol intake to no more than 1 drink a day for nonpregnant women and 2 drinks a day for men. One drink equals 12 oz of beer, 5 oz of wine, or 1 oz of hard liquor. General instructions  Keep a weight loss journal to keep track of the food you eat and how much you exercise you get.  Take over-the-counter and prescription medicines only as told by your health care provider.  Take vitamins and supplements only as told by your health care provider.  Consider joining a support group. Your health care provider may be able to recommend a support group.  Keep all follow-up visits as told by your health care provider. This is important. Contact a health care provider if:  You are unable to meet your weight loss goal after 6 weeks of dietary and lifestyle changes. This information is not intended to replace advice given to you by your health care provider. Make sure you discuss any questions you have with your health care provider. Document Released: 12/15/2004 Document Revised: 04/11/2016 Document Reviewed: 08/26/2015 Elsevier Interactive Patient Education  2019 [employer] Inc.  Preventing Unhealthy [employer], Adult Staying at a healthy  weight is important to your overall health. When fat builds up in your body, you may become overweight or obese. Being overweight or obese increases your risk of developing certain health problems, such as heart disease, diabetes, sleeping problems, joint problems, and some types of cancer. Unhealthy weight gain is often the result of making unhealthy food choices or not getting enough exercise. You can make changes to your lifestyle to prevent obesity and stay as healthy as possible. What nutrition changes can be made?   Eat only as much as your body needs. To do this: ? Pay attention to signs that you are hungry or full. Stop eating  as soon as you feel full. ? If you feel hungry, try drinking water first before eating. Drink enough water so your urine is clear or pale yellow. ? Eat smaller portions. Pay attention to portion sizes when eating out. ? Look at serving sizes on food labels. Most foods contain more than one serving per container. ? Eat the recommended number of calories for your gender and activity level. For most active people, a daily total of 2,000 calories is appropriate. If you are trying to lose weight or are not very active, you may need to eat fewer calories. Talk with your health care provider or a diet and nutrition specialist (dietitian) about how many calories you need each day.  Choose healthy foods, such as: ? Fruits and vegetables. At each meal, try to fill at least half of your plate with fruits and vegetables. ? Whole grains, such as whole-wheat bread, brown rice, and quinoa. ? Lean meats, such as chicken or fish. ? Other healthy proteins, such as beans, eggs, or tofu. ? Healthy fats, such as nuts, seeds, fatty fish, and olive oil. ? Low-fat or fat-free dairy products.  Check food labels, and avoid food and drinks that: ? Are high in calories. ? Have added sugar. ? Are high in sodium. ? Have saturated fats or trans fats.  Cook foods in healthier ways, such as by baking, broiling, or grilling.  Make a meal plan for the week, and shop with a grocery list to help you stay on track with your purchases. Try to avoid going to the grocery store when you are hungry.  When grocery shopping, try to shop around the outside of the store first, where the fresh foods are. Doing this helps you to avoid prepackaged foods, which can be high in sugar, salt (sodium), and fat. What lifestyle changes can be made?   Exercise for 30 or more minutes on 5 or more days each week. Exercising may include brisk walking, yard work, biking, running, swimming, and team sports like basketball and soccer. Ask your health  care provider which exercises are safe for you.  Do muscle-strengthening activities, such as lifting weights or using resistance bands, on 2 or more days a week.  Do not use any products that contain nicotine or tobacco, such as cigarettes and e-cigarettes. If you need help quitting, ask your health care provider.  Limit alcohol intake to no more than 1 drink a day for nonpregnant women and 2 drinks a day for men. One drink equals 12 oz of beer, 5 oz of wine, or 1 oz of hard liquor.  Try to get 7-9 hours of sleep each night. What other changes can be made?  Keep a food and activity journal to keep track of: ? What you ate and how many calories you had. Remember to count the calories in  sauces, dressings, and side dishes. ? Whether you were active, and what exercises you did. ? Your calorie, weight, and activity goals.  Check your weight regularly. Track any changes. If you notice you have gained weight, make changes to your diet or activity routine.  Avoid taking weight-loss medicines or supplements. Talk to your health care provider before starting any new medicine or supplement.  Talk to your health care provider before trying any new diet or exercise plan. Why are these changes important? Eating healthy, staying active, and having healthy habits can help you to prevent obesity. Those changes also:  Help you manage stress and emotions.  Help you connect with friends and family.  Improve your self-esteem.  Improve your sleep.  Prevent long-term health problems. What can happen if changes are not made? Being obese or overweight can cause you to develop joint or bone problems, which can make it hard for you to stay active or do activities you enjoy. Being obese or overweight also puts stress on your heart and lungs and can lead to health problems like diabetes, heart disease, and some cancers. Where to find more information Talk with your health care provider or a dietitian about  healthy eating and healthy lifestyle choices. You may also find information from:  [place]. Department of Agriculture, MyPlate: FormerBoss.no  American Heart Association: www.heart.org  Centers for Disease Control and Prevention: http://www.wolf.info/ Summary  Staying at a healthy weight is important to your overall health. It helps you to prevent certain diseases and health problems, such as heart disease, diabetes, joint problems, sleep disorders, and some types of cancer.  Being obese or overweight can cause you to develop joint or bone problems, which can make it hard for you to stay active or do activities you enjoy.  You can prevent unhealthy weight gain by eating a healthy diet, exercising regularly, not smoking, limiting alcohol, and getting enough sleep.  Talk with your health care provider or a dietitian for guidance about healthy eating and healthy lifestyle choices. This information is not intended to replace advice given to you by your health care provider. Make sure you discuss any questions you have with your health care provider. Document Released: 11/08/2016 Document Revised: 08/18/2017 Document Reviewed: 12/14/2016 Elsevier Interactive Patient Education  2019 [employer].

## 2018-12-03 NOTE — Assessment & Plan Note (Signed)
Foot eam by MD today; eye exam UTD
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
## 2018-12-04 LAB — LIPID PANEL
CHOLESTEROL: 161 mg/dL (ref <200)
HDL: 54 mg/dL (ref >50)
LDL Cholesterol (Calc): 77 mg/dL (calc)
Non-HDL Cholesterol (Calc): 107 mg/dL (calc) (ref <130)
Total CHOL/HDL Ratio: 3 (calc) (ref <5.0)
Triglycerides: 205 mg/dL — ABNORMAL HIGH (ref <150)

## 2018-12-04 LAB — HEMOGLOBIN A1C
Hgb A1c MFr Bld: 5.6 % of total Hgb (ref <5.7)
Mean Plasma Glucose: 114 (calc)
eAG (mmol/L): 6.3 (calc)

## 2018-12-04 LAB — MICROALBUMIN / CREATININE URINE RATIO
Creatinine, Urine: 144 mg/dL (ref 20–275)
Microalb Creat Ratio: 23 mcg/mg creat (ref <30)
Microalb, Ur: 3.3 mg/dL

## 2018-12-04 LAB — VITAMIN D 25 HYDROXY (VIT D DEFICIENCY, FRACTURES): Vit D, 25-Hydroxy: 15 ng/mL — ABNORMAL LOW (ref 30–100)

## 2018-12-04 LAB — VITAMIN B12: Vitamin B-12: 493 pg/mL (ref 200–1100)

## 2018-12-04 LAB — TSH: TSH: 0.92 mIU/L

## 2018-12-06 ENCOUNTER — Other Ambulatory Visit: Payer: Self-pay | Admitting: Family Medicine

## 2018-12-06 MED ORDER — VITAMIN D (ERGOCALCIFEROL) 1.25 MG (50000 UNIT) PO CAPS: 50000.0000 [IU] | ORAL_CAPSULE | ORAL | 1 refills | Status: DC

## 2018-12-31 ENCOUNTER — Encounter: Payer: Self-pay | Admitting: Psychiatry

## 2018-12-31 ENCOUNTER — Ambulatory Visit (INDEPENDENT_AMBULATORY_CARE_PROVIDER_SITE_OTHER): Payer: No Typology Code available for payment source | Admitting: Psychiatry

## 2018-12-31 ENCOUNTER — Other Ambulatory Visit: Payer: Self-pay

## 2018-12-31 VITALS — BP 133/88 | HR 102 | Temp 99.1°F | Wt 201.6 lb

## 2018-12-31 DIAGNOSIS — Z634 Disappearance and death of family member: Secondary | ICD-10-CM | POA: Diagnosis not present

## 2018-12-31 DIAGNOSIS — F4010 Social phobia, unspecified: Secondary | ICD-10-CM

## 2018-12-31 DIAGNOSIS — Z726 Gambling and betting: Secondary | ICD-10-CM

## 2018-12-31 DIAGNOSIS — F17200 Nicotine dependence, unspecified, uncomplicated: Secondary | ICD-10-CM

## 2018-12-31 MED ORDER — FLUOXETINE HCL 40 MG PO CAPS: 40.0000 mg | ORAL_CAPSULE | Freq: Every day | ORAL | 1 refills | Status: DC
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
## 2018-12-31 NOTE — Patient Instructions (Signed)
Impulse Control Disorders Impulse control disorders are a group of mental health disorders in which a person is unable to control his or her sudden desire to do something (impulse). People who are unable to control impulses repeatedly act without planning or thinking about consequences. A person with an impulse control disorder may:  Have outbursts of anger and argue with authority figures.  Be physically or verbally aggressive toward others.  Regularly break rules or laws. This may include harming people, animals, or property.  Steal, start fires, gamble, or engage in other risky behaviors. Impulse control disorders typically start in the late teenage to early adult years. People with impulse control disorders often have emotional disorders or other forms of mental illness, such as drug abuse or other addiction problems. Over time, impulse disorders may cause problems in relationships and may result in legal problems. What are the causes? The cause of these conditions is not known. What increases the risk? The following factors may make you more likely to develop this condition:  Experiencing abuse or neglect during childhood.  Experiencing physical or emotional trauma during childhood.  Having a parent or sibling with an impulse control disorder.  Having another mental health condition, such as ADHD (attention deficit hyperactivity disorder) or a substance abuse disorder. What are the signs or symptoms? Unlike people with other mental health, substance abuse, or general medical conditions, people with impulse control disorders cannot keep from acting on their impulses. They may:  Have trouble controlling emotions, especially anger.  Feel like they must act on an impulse when they experience strong emotions or stress.  Have specific impulsive behaviors that relieve tension, such as setting a fire or stealing.  Have anxiety, tension, or excitement before or during the impulsive  behavior.  Feel relief or pleasure while acting on the impulse, or after acting on it.  Act without regard for the safety of themselves or others.  Act without planning ahead.  Feel regret or guilt after acting on an impulse. Symptoms of impulse control disorders differ based on the specific disorder. How is this diagnosed? These disorders are diagnosed through an assessment by your health care provider. Your health care provider will ask questions about:  Your impulsive behavior.  Your moods.  Your thoughts.  Past and recent life events.  Your medical history.  Your use of alcohol or drugs, including prescription medicines. Certain medical conditions, other mental illnesses, and certain substances can cause symptoms similar to impulse control disorders. You may be referred to a mental health specialist. How is this treated? Impulse control disorders may be treated with a combination of the following treatments:  Cognitive behavioral therapy (CBT). This is a form of talk therapy. It focuses on reducing negative beliefs and thoughts related to impulsive behavior and replacing them with healthier thoughts and behaviors. This may be done individually or in a group setting.  Medicines.  Family intervention. This is a program that educates family members about your disorder. It teaches healthy communication and problem-solving skills, and it helps family members support you. Follow these instructions at home:   Take over-the-counter and prescription medicines only as told by your health care provider. Check with your health care provider before starting any new prescription or over-the-counter medicines.  Keep all follow-up visits as told by your health care providers or therapists. This is important. This includes going to therapy or family intervention as directed.  Find a support group to talk with peers about managing stress and impulses.  Find  healthy ways to recognize  emotions and manage stress, such as: ? Journaling. ? Exercise. ? Deep breathing, yoga, or meditation. Contact a health care provider if:  You are not able to take your medicines as prescribed.  Your symptoms get worse. Get help right away if:  You think about hurting yourself or others. If you ever feel like you may hurt yourself or others, or have thoughts about taking your own life, get help right away. You can go to your nearest emergency department or call:  Your local emergency services (911 in the [place]  A suicide crisis helpline, such as the [place] at 480-031-2244. This is open 24 hours a day. Summary  Impulse control disorders are a group of mental health disorders in which a person is unable to control his or her sudden desire to do something (impulse).  People with these disorders act impulsively through certain behaviors in order to feel relief from stress or emotional tension.  Treatment may include cognitive behavioral therapy (CBT), medicines, family intervention, or a combination of these. This information is not intended to replace advice given to you by your health care provider. Make sure you discuss any questions you have with your health care provider. Document Released: 03/19/2007 Document Revised: 11/29/2016 Document Reviewed: 11/29/2016 Elsevier Interactive Patient Education  2019 [place] Disorder Gambling is betting money or other valuables on the outcome of a game or event. Gambling disorder is a mental illness. People with this disorder cannot control their gambling. For them, gambling is an addiction without the drug. They may get a feeling of extreme pleasure (euphoria) from gambling. Gambling may also be a way to escape or avoid certain emotional experiences. They want to gamble again and again. Over time, they have more trouble resisting the urge to gamble. They keep gambling even when they lose big. They may  turn to illegal activities to get money for gambling. Gambling disorder interferes with normal life activities. It often causes problems with relationships, finances, and performance at work or school. People with gambling disorder are at high risk for suicide and physical health problems. What are the causes? The cause of this condition is not known. What increases the risk? The following factors may make you more likely to develop this condition:  Age. The disorder is most common in younger or middle-aged adults.  Gender. The disorder is more common in men, but the risk increases in women as they grow older.  Being a highly impulsive person.  Having family members with the disorder.  Taking certain medicines for Parkinson's disease.  Having a substance use disorder, depression, or other mental disorder. Examples include bipolar disorder, obsessive-compulsive disorder (OCD), personality disorder, or attention deficit hyperactivity disorder (ADHD). What are the signs or symptoms? Symptoms of this condition include:  Needing to risk more money to feel excited.  Feeling restless or irritable when trying to limit or stop gambling.  Making many unsuccessful efforts to limit or stop gambling.  Thinking about gambling often.  Gambling to deal with feelings such as sadness, anxiety, guilt, or anger.  Placing larger bets to make back money that you lost in the past.  Hiding or lying about the extent of your gambling.  Losing an important relationship, job, or career or educational opportunity because of gambling.  Relying on others to pay for gambling debts. People with gambling disorder have 4 or more of these symptoms within 1 year. The symptoms of gambling disorder may be continuous,  or they may occur in cycles separated by several months. In people with bipolar disorder, the symptoms are not limited to manic episodes. How is this diagnosed? This condition may be diagnosed through  an assessment by your health care provider or a mental health provider. He or she will ask questions about your gambling and how it affects your life. People with bipolar disorder may gamble during a manic episode but not have gambling disorder. The severity of gambling disorder depends on how many symptoms you have:  Mild: 4 or 5 symptoms.  Moderate: 6 or 7 symptoms.  Severe: 8 or 9 symptoms. How is this treated?     Treatment for this condition usually includes a combination of:  Counseling or talk therapy. Certain forms of talk therapy can be helpful. Behavioral therapy teaches you how to resist gambling urges. It helps you learn to replace gambling with healthy behavior. Cognitive therapy helps you identify and change negative thoughts and beliefs that may lead to gambling.  Mindfulness-based treatments. This type of therapy helps you become more aware of your thoughts, emotions, and impulses so you can better control them.  Support groups. Groups such as Gamblers Anonymous provide emotional support and guidance for quitting gambling.  Medicine. Certain medicines used to treat substance use disorders may reduce gambling urges or behavior. Antidepressant medicines and mood stabilizers may help people with gambling disorder who also have a psychiatric disorder. Follow these instructions at home:  Take over-the-counter and prescription medicines only as told by your health care provider.  Attend Gamblers Anonymous to help achieve and sustain behavior changes. Find a sponsor and accept his or her help.  Find hobbies or activities that you enjoy that do not include gambling.  Find healthy ways to deal with stress and anxiety, such as: ? Exercise. ? Meditation. ? Deep breathing, progressive muscle relaxation, or other methods that help you to calm yourself.  Keep all follow-up visits as told by your health care provider. This is important. Contact a health care provider if:  You  are not able to take your medicines as directed.  Your symptoms get worse. Get help right away if:  You have serious thoughts of hurting yourself or someone else. If you ever feel like you may hurt yourself or others, or have thoughts about taking your own life, get help right away. You can go to your nearest emergency department or call:  Your local emergency services (911 in the [place]  A suicide crisis helpline, such as the [facility] at (762) 886-9032. This is open 24 hours a day. Summary  Gambling disorder is a mental illness. It often causes problems with relationships, finances, and performance at work or school.  People with gambling disorder may get a feeling of extreme pleasure (euphoria) from gambling. Gambling may also be a way to escape or avoid certain emotional experiences.  Gambling disorder symptoms have many similarities to substance-related addictions.  Counseling and mindfulness-based therapy can help you identify and change negative thoughts, beliefs, and emotions that may lead to gambling. Support groups such as Gamblers Anonymous provide emotional support and guidance for quitting gambling. This information is not intended to replace advice given to you by your health care provider. Make sure you discuss any questions you have with your health care provider. Document Released: 08/02/2001 Document Revised: 07/27/2017 Document Reviewed: 07/27/2017 Else[employer] Interactive Patient Education  2019 [employer]. Fluoxetine capsules or tablets (Depression/Mood Disorders) What is this medicine? FLUOXETINE (floo OX e teen) belongs  to a class of drugs known as selective serotonin reuptake inhibitors (SSRIs). It helps to treat mood problems such as depression, obsessive compulsive disorder, and panic attacks. It can also treat certain eating disorders. This medicine may be used for other purposes; ask your health care provider or pharmacist if you have  questions. COMMON BRAND NAME(S): Prozac What should I tell my health care provider before I take this medicine? They need to know if you have any of these conditions: -bipolar disorder or a family history of bipolar disorder -bleeding disorders -glaucoma -heart disease -liver disease -low levels of sodium in the blood -seizures -suicidal thoughts, plans, or attempt; a previous suicide attempt by you or a family member -take MAOIs like Carbex, Eldepryl, Marplan, Nardil, and Parnate -take medicines that treat or prevent blood clots -thyroid disease -an unusual or allergic reaction to fluoxetine, other medicines, foods, dyes, or preservatives -pregnant or trying to get pregnant -breast-feeding How should I use this medicine? Take this medicine by mouth with a glass of water. Follow the directions on the prescription label. You can take this medicine with or without food. Take your medicine at regular intervals. Do not take it more often than directed. Do not stop taking this medicine suddenly except upon the advice of your doctor. Stopping this medicine too quickly may cause serious side effects or your condition may worsen. A special MedGuide will be given to you by the pharmacist with each prescription and refill. Be sure to read this information carefully each time. Talk to your pediatrician regarding the use of this medicine in children. While this drug may be prescribed for children as young as 7 years for selected conditions, precautions do apply. Overdosage: If you think you have taken too much of this medicine contact a poison control center or emergency room at once. NOTE: This medicine is only for you. Do not share this medicine with others. What if I miss a dose? If you miss a dose, skip the missed dose and go back to your regular dosing schedule. Do not take double or extra doses. What may interact with this medicine? Do not take this medicine with any of the following  medications: -other medicines containing fluoxetine, like Sarafem or Symbyax -cisapride -dronedarone -linezolid -MAOIs like Carbex, Eldepryl, Marplan, Nardil, and Parnate -methylene blue (injected into a vein) -pimozide -thioridazine This medicine may also interact with the following medications: -alcohol -amphetamines -aspirin and aspirin-like medicines -carbamazepine -certain medicines for depression, anxiety, or psychotic disturbances -certain medicines for migraine headaches like almotriptan, eletriptan, frovatriptan, naratriptan, rizatriptan, sumatriptan, zolmitriptan -digoxin -diuretics -fentanyl -flecainide -furazolidone -isoniazid -lithium -medicines for sleep -medicines that treat or prevent blood clots like warfarin, enoxaparin, and dalteparin -NSAIDs, medicines for pain and inflammation, like ibuprofen or naproxen -other medicines that prolong the QT interval (an abnormal heart rhythm) -phenytoin -procarbazine -propafenone -rasagiline -ritonavir -supplements like St. John's wort, kava kava, valerian -tramadol -tryptophan -vinblastine This list may not describe all possible interactions. Give your health care provider a list of all the medicines, herbs, non-prescription drugs, or dietary supplements you use. Also tell them if you smoke, drink alcohol, or use illegal drugs. Some items may interact with your medicine. What should I watch for while using this medicine? Tell your doctor if your symptoms do not get better or if they get worse. Visit your doctor or health care professional for regular checks on your progress. Because it may take several weeks to see the full effects of this medicine, it is important to continue  your treatment as prescribed by your doctor. Patients and their families should watch out for new or worsening thoughts of suicide or depression. Also watch out for sudden changes in feelings such as feeling anxious, agitated, panicky, irritable,  hostile, aggressive, impulsive, severely restless, overly excited and hyperactive, or not being able to sleep. If this happens, especially at the beginning of treatment or after a change in dose, call your health care professional. Dennis Bast may get drowsy or dizzy. Do not drive, use machinery, or do anything that needs mental alertness until you know how this medicine affects you. Do not stand or sit up quickly, especially if you are an older patient. This reduces the risk of dizzy or fainting spells. Alcohol may interfere with the effect of this medicine. Avoid alcoholic drinks. Your mouth may get dry. Chewing sugarless gum or sucking hard candy, and drinking plenty of water may help. Contact your doctor if the problem does not go away or is severe. This medicine may affect blood sugar levels. If you have diabetes, check with your doctor or health care professional before you change your diet or the dose of your diabetic medicine. What side effects may I notice from receiving this medicine? Side effects that you should report to your doctor or health care professional as soon as possible: -allergic reactions like skin rash, itching or hives, swelling of the face, lips, or tongue -anxious -black, tarry stools -breathing problems -changes in vision -confusion -elevated mood, decreased need for sleep, racing thoughts, impulsive behavior -eye pain -fast, irregular heartbeat -feeling faint or lightheaded, falls -feeling agitated, angry, or irritable -hallucination, loss of contact with reality -loss of balance or coordination -loss of memory -painful or prolonged erections -restlessness, pacing, inability to keep still -seizures -stiff muscles -suicidal thoughts or other mood changes -trouble sleeping -unusual bleeding or bruising -unusually weak or tired -vomiting Side effects that usually do not require medical attention (report to your doctor or health care professional if they continue or are  bothersome): -change in appetite or weight -change in sex drive or performance -diarrhea -dry mouth -headache -increased sweating -nausea -tremors This list may not describe all possible side effects. Call your doctor for medical advice about side effects. You may report side effects to FDA at 1-800-FDA-1088. Where should I keep my medicine? Keep out of the reach of children. Store at room temperature between 15 and 30 degrees C (59 and 86 degrees F). Throw away any unused medicine after the expiration date. NOTE: This sheet is a summary. It may not cover all possible information. If you have questions about this medicine, talk to your doctor, pharmacist, or health care provider.  2019 Elsevier/[employer] (2018-06-28 11:56:53)

## 2019-01-09 ENCOUNTER — Other Ambulatory Visit: Payer: Self-pay | Admitting: Family Medicine

## 2019-01-11 ENCOUNTER — Encounter: Payer: Self-pay | Admitting: Family Medicine

## 2019-01-11 ENCOUNTER — Other Ambulatory Visit (HOSPITAL_COMMUNITY)
Admission: RE | Admit: 2019-01-11 | Discharge: 2019-01-11 | Disposition: A | Discharge status: Home / Self Care | Payer: No Typology Code available for payment source | Source: Ambulatory Visit | Attending: Family Medicine | Admitting: Family Medicine

## 2019-01-11 ENCOUNTER — Ambulatory Visit (INDEPENDENT_AMBULATORY_CARE_PROVIDER_SITE_OTHER): Payer: No Typology Code available for payment source | Admitting: Family Medicine

## 2019-01-11 ENCOUNTER — Ambulatory Visit
Admission: RE | Admit: 2019-01-11 | Discharge: 2019-01-11 | Disposition: A | Discharge status: Home / Self Care | Payer: No Typology Code available for payment source | Source: Ambulatory Visit | Attending: Family Medicine | Admitting: Family Medicine

## 2019-01-11 VITALS — BP 128/78 | HR 88 | Temp 98.2°F | Resp 14 | Ht 62.5 in | Wt 197.2 lb

## 2019-01-11 DIAGNOSIS — N898 Other specified noninflammatory disorders of vagina: Secondary | ICD-10-CM

## 2019-01-11 DIAGNOSIS — Z1231 Encounter for screening mammogram for malignant neoplasm of breast: Secondary | ICD-10-CM | POA: Insufficient documentation

## 2019-01-11 DIAGNOSIS — Z124 Encounter for screening for malignant neoplasm of cervix: Secondary | ICD-10-CM | POA: Insufficient documentation

## 2019-01-11 DIAGNOSIS — L918 Other hypertrophic disorders of the skin: Secondary | ICD-10-CM

## 2019-01-11 DIAGNOSIS — D485 Neoplasm of uncertain behavior of skin: Secondary | ICD-10-CM

## 2019-01-11 DIAGNOSIS — Z0000 Encounter for general adult medical examination without abnormal findings: Secondary | ICD-10-CM | POA: Diagnosis not present

## 2019-01-11 DIAGNOSIS — Z1239 Encounter for other screening for malignant neoplasm of breast: Secondary | ICD-10-CM

## 2019-01-11 NOTE — Patient Instructions (Addendum)
I do encourage you to quit smoking Call [phone] to sign up for smoking cessation classes You can call 1-800-QUIT-NOW to talk with a smoking cessation coach   Steps to Quit Smoking  Smoking tobacco can be bad for your health. It can also affect almost every organ in your body. Smoking puts you and people around you at risk for many serious long-lasting (chronic) diseases. Quitting smoking is hard, but it is one of the best things that you can do for your health. It is never too late to quit. What are the benefits of quitting smoking? When you quit smoking, you lower your risk for getting serious diseases and conditions. They can include:  Lung cancer or lung disease.  Heart disease.  Stroke.  Heart attack.  Not being able to have children (infertility).  Weak bones (osteoporosis) and broken bones (fractures). If you have coughing, wheezing, and shortness of breath, those symptoms may get better when you quit. You may also get sick less often. If you are pregnant, quitting smoking can help to lower your chances of having a baby of low birth weight. What can I do to help me quit smoking? Talk with your doctor about what can help you quit smoking. Some things you can do (strategies) include:  Quitting smoking totally, instead of slowly cutting back how much you smoke over a period of time.  Going to in-person counseling. You are more likely to quit if you go to many counseling sessions.  Using resources and support systems, such as: ? Database administrator with a Social worker. ? Phone quitlines. ? Careers information officer. ? Support groups or group counseling. ? Text messaging programs. ? Mobile phone apps or applications.  Taking medicines. Some of these medicines may have nicotine in them. If you are pregnant or breastfeeding, do not take any medicines to quit smoking unless your doctor says it is okay. Talk with your doctor about counseling or other things that can help you. Talk  with your doctor about using more than one strategy at the same time, such as taking medicines while you are also going to in-person counseling. This can help make quitting easier. What things can I do to make it easier to quit? Quitting smoking might feel very hard at first, but there is a lot that you can do to make it easier. Take these steps:  Talk to your family and friends. Ask them to support and encourage you.  Call phone quitlines, reach out to support groups, or work with a Social worker.  Ask people who smoke to not smoke around you.  Avoid places that make you want (trigger) to smoke, such as: ? Bars. ? Parties. ? Smoke-break areas at work.  Spend time with people who do not smoke.  Lower the stress in your life. Stress can make you want to smoke. Try these things to help your stress: ? Getting regular exercise. ? Deep-breathing exercises. ? Yoga. ? Meditating. ? Doing a body scan. To do this, close your eyes, focus on one area of your body at a time from head to toe, and notice which parts of your body are tense. Try to relax the muscles in those areas.  Download or buy apps on your mobile phone or tablet that can help you stick to your quit plan. There are many free apps, such as QuitGuide from the [employer] Office manager for Disease Control and Prevention). You can find more support from smokefree.gov and other websites. This information is not intended to  replace advice given to you by your health care provider. Make sure you discuss any questions you have with your health care provider. Document Released: 09/03/2009 Document Revised: 07/05/2016 Document Reviewed: 03/24/2015 Elsevier Interactive Patient Education  2019 [employer].  After you finish the prescription vitamin D, take 800 to 1000 iu of OTC vitamin D3 daily when not getting much sun  Health Maintenance, Female Adopting a healthy lifestyle and getting preventive care can go a long way to promote health and wellness.  Talk with your health care provider about what schedule of regular examinations is right for you. This is a good chance for you to check in with your provider about disease prevention and staying healthy. In between checkups, there are plenty of things you can do on your own. Experts have done a lot of research about which lifestyle changes and preventive measures are most likely to keep you healthy. Ask your health care provider for more information. Weight and diet Eat a healthy diet  Be sure to include plenty of vegetables, fruits, low-fat dairy products, and lean protein.  Do not eat a lot of foods high in solid fats, added sugars, or salt.  Get regular exercise. This is one of the most important things you can do for your health. ? Most adults should exercise for at least 150 minutes each week. The exercise should increase your heart rate and make you sweat (moderate-intensity exercise). ? Most adults should also do strengthening exercises at least twice a week. This is in addition to the moderate-intensity exercise. Maintain a healthy weight  Body mass index (BMI) is a measurement that can be used to identify possible weight problems. It estimates body fat based on height and weight. Your health care provider can help determine your BMI and help you achieve or maintain a healthy weight.  For females 55 years of age and older: ? A BMI below 18.5 is considered underweight. ? A BMI of 18.5 to 24.9 is normal. ? A BMI of 25 to 29.9 is considered overweight. ? A BMI of 30 and above is considered obese. Watch levels of cholesterol and blood lipids  You should start having your blood tested for lipids and cholesterol at 48 years of age, then have this test every 5 years.  You may need to have your cholesterol levels checked more often if: ? Your lipid or cholesterol levels are high. ? You are older than 48 years of age. ? You are at high risk for heart disease. Cancer screening Lung  Cancer  Lung cancer screening is recommended for adults 23-66 years old who are at high risk for lung cancer because of a history of smoking.  A yearly low-dose CT scan of the lungs is recommended for people who: ? Currently smoke. ? Have quit within the past 15 years. ? Have at least a 30-pack-year history of smoking. A pack year is smoking an average of one pack of cigarettes a day for 1 year.  Yearly screening should continue until it has been 15 years since you quit.  Yearly screening should stop if you develop a health problem that would prevent you from having lung cancer treatment. Breast Cancer  Practice breast self-awareness. This means understanding how your breasts normally appear and feel.  It also means doing regular breast self-exams. Let your health care provider know about any changes, no matter how small.  If you are in your 20s or 30s, you should have a clinical breast exam (CBE) by  a health care provider every 1-3 years as part of a regular health exam.  If you are 97 or older, have a CBE every year. Also consider having a breast X-ray (mammogram) every year.  If you have a family history of breast cancer, talk to your health care provider about genetic screening.  If you are at high risk for breast cancer, talk to your health care provider about having an MRI and a mammogram every year.  Breast cancer gene (BRCA) assessment is recommended for women who have family members with BRCA-related cancers. BRCA-related cancers include: ? Breast. ? Ovarian. ? Tubal. ? Peritoneal cancers.  Results of the assessment will determine the need for genetic counseling and BRCA1 and BRCA2 testing. Cervical Cancer Your health care provider may recommend that you be screened regularly for cancer of the pelvic organs (ovaries, uterus, and vagina). This screening involves a pelvic examination, including checking for microscopic changes to the surface of your cervix (Pap test). You may  be encouraged to have this screening done every 3 years, beginning at age 17.  For women ages 32-65, health care providers may recommend pelvic exams and Pap testing every 3 years, or they may recommend the Pap and pelvic exam, combined with testing for human papilloma virus (HPV), every 5 years. Some types of HPV increase your risk of cervical cancer. Testing for HPV may also be done on women of any age with unclear Pap test results.  Other health care providers may not recommend any screening for nonpregnant women who are considered low risk for pelvic cancer and who do not have symptoms. Ask your health care provider if a screening pelvic exam is right for you.  If you have had past treatment for cervical cancer or a condition that could lead to cancer, you need Pap tests and screening for cancer for at least 20 years after your treatment. If Pap tests have been discontinued, your risk factors (such as having a new sexual partner) need to be reassessed to determine if screening should resume. Some women have medical problems that increase the chance of getting cervical cancer. In these cases, your health care provider may recommend more frequent screening and Pap tests. Colorectal Cancer  This type of cancer can be detected and often prevented.  Routine colorectal cancer screening usually begins at 48 years of age and continues through 48 years of age.  Your health care provider may recommend screening at an earlier age if you have risk factors for colon cancer.  Your health care provider may also recommend using home test kits to check for hidden blood in the stool.  A small camera at the end of a tube can be used to examine your colon directly (sigmoidoscopy or colonoscopy). This is done to check for the earliest forms of colorectal cancer.  Routine screening usually begins at age 49.  Direct examination of the colon should be repeated every 5-10 years through 48 years of age. However, you  may need to be screened more often if early forms of precancerous polyps or small growths are found. Skin Cancer  Check your skin from head to toe regularly.  Tell your health care provider about any new moles or changes in moles, especially if there is a change in a mole's shape or color.  Also tell your health care provider if you have a mole that is larger than the size of a pencil eraser.  Always use sunscreen. Apply sunscreen liberally and repeatedly throughout the day.  Protect yourself by wearing long sleeves, pants, a wide-brimmed hat, and sunglasses whenever you are outside. Heart disease, diabetes, and high blood pressure  High blood pressure causes heart disease and increases the risk of stroke. High blood pressure is more likely to develop in: ? People who have blood pressure in the high end of the normal range (130-139/85-89 mm Hg). ? People who are overweight or obese. ? People who are African American.  If you are 58-79 years of age, have your blood pressure checked every 3-5 years. If you are 74 years of age or older, have your blood pressure checked every year. You should have your blood pressure measured twice-once when you are at a hospital or clinic, and once when you are not at a hospital or clinic. Record the average of the two measurements. To check your blood pressure when you are not at a hospital or clinic, you can use: ? An automated blood pressure machine at a pharmacy. ? A home blood pressure monitor.  If you are between 8 years and 10 years old, ask your health care provider if you should take aspirin to prevent strokes.  Have regular diabetes screenings. This involves taking a blood sample to check your fasting blood sugar level. ? If you are at a normal weight and have a low risk for diabetes, have this test once every three years after 48 years of age. ? If you are overweight and have a high risk for diabetes, consider being tested at a younger age or more  often. Preventing infection Hepatitis B  If you have a higher risk for hepatitis B, you should be screened for this virus. You are considered at high risk for hepatitis B if: ? You were born in a country where hepatitis B is common. Ask your health care provider which countries are considered high risk. ? Your parents were born in a high-risk country, and you have not been immunized against hepatitis B (hepatitis B vaccine). ? You have HIV or AIDS. ? You use needles to inject street drugs. ? You live with someone who has hepatitis B. ? You have had sex with someone who has hepatitis B. ? You get hemodialysis treatment. ? You take certain medicines for conditions, including cancer, organ transplantation, and autoimmune conditions. Hepatitis C  Blood testing is recommended for: ? Everyone born from [birth year] through 1965. ? Anyone with known risk factors for hepatitis C. Sexually transmitted infections (STIs)  You should be screened for sexually transmitted infections (STIs) including gonorrhea and chlamydia if: ? You are sexually active and are younger than 48 years of age. ? You are older than 48 years of age and your health care provider tells you that you are at risk for this type of infection. ? Your sexual activity has changed since you were last screened and you are at an increased risk for chlamydia or gonorrhea. Ask your health care provider if you are at risk.  If you do not have HIV, but are at risk, it may be recommended that you take a prescription medicine daily to prevent HIV infection. This is called pre-exposure prophylaxis (PrEP). You are considered at risk if: ? You are sexually active and do not regularly use condoms or know the HIV status of your partner(s). ? You take drugs by injection. ? You are sexually active with a partner who has HIV. Talk with your health care provider about whether you are at high risk of being infected with HIV. If  you choose to begin PrEP, you  should first be tested for HIV. You should then be tested every 3 months for as long as you are taking PrEP. Pregnancy  If you are premenopausal and you may become pregnant, ask your health care provider about preconception counseling.  If you may become pregnant, take 400 to 800 micrograms (mcg) of folic acid every day.  If you want to prevent pregnancy, talk to your health care provider about birth control (contraception). Osteoporosis and menopause  Osteoporosis is a disease in which the bones lose minerals and strength with aging. This can result in serious bone fractures. Your risk for osteoporosis can be identified using a bone density scan.  If you are 28 years of age or older, or if you are at risk for osteoporosis and fractures, ask your health care provider if you should be screened.  Ask your health care provider whether you should take a calcium or vitamin D supplement to lower your risk for osteoporosis.  Menopause may have certain physical symptoms and risks.  Hormone replacement therapy may reduce some of these symptoms and risks. Talk to your health care provider about whether hormone replacement therapy is right for you. Follow these instructions at home:  Schedule regular health, dental, and eye exams.  Stay current with your immunizations.  Do not use any tobacco products including cigarettes, chewing tobacco, or electronic cigarettes.  If you are pregnant, do not drink alcohol.  If you are breastfeeding, limit how much and how often you drink alcohol.  Limit alcohol intake to no more than 1 drink per day for nonpregnant women. One drink equals 12 ounces of beer, 5 ounces of wine, or 1 ounces of hard liquor.  Do not use street drugs.  Do not share needles.  Ask your health care provider for help if you need support or information about quitting drugs.  Tell your health care provider if you often feel depressed.  Tell your health care provider if you have  ever been abused or do not feel safe at home. This information is not intended to replace advice given to you by your health care provider. Make sure you discuss any questions you have with your health care provider. Document Released: 05/23/2011 Document Revised: 04/14/2016 Document Reviewed: 08/11/2015 Elsevier Interactive Patient Education  2019 [employer].
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
## 2019-01-12 LAB — COMPLETE METABOLIC PANEL WITH GFR
AG Ratio: 1.7 (calc) (ref 1.0–2.5)
ALT: 20 U/L (ref 6–29)
AST: 17 U/L (ref 10–35)
Albumin: 4.2 g/dL (ref 3.6–5.1)
Alkaline phosphatase (APISO): 85 U/L (ref 31–125)
BUN: 13 mg/dL (ref 7–25)
CO2: 23 mmol/L (ref 20–32)
Calcium: 9.1 mg/dL (ref 8.6–10.2)
Chloride: 106 mmol/L (ref 98–110)
Creat: 0.67 mg/dL (ref 0.50–1.10)
GFR, EST AFRICAN AMERICAN: 121 mL/min/{1.73_m2} (ref >=60)
GFR, Est Non African American: 105 mL/min/{1.73_m2} (ref >=60)
Globulin: 2.5 g/dL (calc) (ref 1.9–3.7)
Glucose, Bld: 74 mg/dL (ref 65–99)
Potassium: 4.2 mmol/L (ref 3.5–5.3)
Sodium: 138 mmol/L (ref 135–146)
Total Bilirubin: 0.3 mg/dL (ref 0.2–1.2)
Total Protein: 6.7 g/dL (ref 6.1–8.1)

## 2019-01-12 LAB — CBC
HCT: 40 % (ref 35.0–45.0)
Hemoglobin: 13.7 g/dL (ref 11.7–15.5)
MCH: 28.1 pg (ref 27.0–33.0)
MCHC: 34.3 g/dL (ref 32.0–36.0)
MCV: 82.1 fL (ref 80.0–100.0)
MPV: 12.1 fL (ref 7.5–12.5)
Platelets: 319 10*3/uL (ref 140–400)
RBC: 4.87 10*6/uL (ref 3.80–5.10)
RDW: 13 % (ref 11.0–15.0)
WBC: 9.4 10*3/uL (ref 3.8–10.8)

## 2019-01-12 LAB — TSH: TSH: 0.63 mIU/L

## 2019-01-14 DIAGNOSIS — Z0000 Encounter for general adult medical examination without abnormal findings: Secondary | ICD-10-CM | POA: Insufficient documentation

## 2019-01-14 LAB — CERVICOVAGINAL ANCILLARY ONLY
Bacterial vaginitis: POSITIVE — AB
CANDIDA VAGINITIS: NEGATIVE
Trichomonas: NEGATIVE

## 2019-01-14 NOTE — Assessment & Plan Note (Signed)
USPSTF grade A and B recommendations reviewed with patient; age-appropriate recommendations, preventive care, screening tests, etc discussed and encouraged; healthy living encouraged; see AVS for patient education given to patient  

## 2019-01-15 ENCOUNTER — Other Ambulatory Visit: Payer: Self-pay | Admitting: Family Medicine

## 2019-01-15 LAB — CYTOLOGY - PAP
Diagnosis: NEGATIVE
HPV (WINDOPATH): NOT DETECTED
Trichomonas: NEGATIVE

## 2019-01-15 MED ORDER — CLINDAMYCIN PHOSPHATE 2 % VA CREA: 1.0000 | TOPICAL_CREAM | Freq: Every day | VAGINAL | 0 refills | Status: DC

## 2019-01-29 ENCOUNTER — Encounter: Payer: Self-pay | Admitting: Family Medicine

## 2019-02-08 ENCOUNTER — Encounter: Payer: Self-pay | Admitting: Family Medicine

## 2019-02-18 MED FILL — metFORMIN HCL ER 500 MG TB2: 500 | 30 days supply | Qty: 60 | Fill #0 | Status: TO

## 2019-03-05 ENCOUNTER — Encounter: Payer: Self-pay | Admitting: Family Medicine

## 2019-03-05 DIAGNOSIS — F4010 Social phobia, unspecified: Secondary | ICD-10-CM

## 2019-03-05 DIAGNOSIS — Z726 Gambling and betting: Secondary | ICD-10-CM

## 2019-03-06 ENCOUNTER — Other Ambulatory Visit: Payer: Self-pay | Admitting: Family Medicine

## 2019-03-06 MED ORDER — FLUOXETINE HCL 40 MG PO CAPS: 40.0000 mg | ORAL_CAPSULE | Freq: Every day | ORAL | 1 refills | Status: DC

## 2019-03-06 MED FILL — CYCLOBENZAPRINE HCL 10 MG T: 10 | 10 days supply | Qty: 30 | Fill #0

## 2019-03-06 MED FILL — LOSARTAN POTASSIUM 50 MG TA: 50 | 90 days supply | Qty: 90 | Fill #0

## 2019-03-07 NOTE — Telephone Encounter (Signed)
Lab Results  Component Value Date   CHOL 161 12/03/2018   HDL 54 12/03/2018   LDLCALC 77 12/03/2018   TRIG 205 (H) 12/03/2018   CHOLHDL 3.0 12/03/2018   Lab Results  Component Value Date   ALT 20 01/11/2019

## 2019-03-08 MED FILL — ATORVASTATIN 20 MG TABLET: 20 | 30 days supply | Qty: 30 | Fill #0

## 2019-04-17 ENCOUNTER — Encounter: Payer: Self-pay | Admitting: Family Medicine

## 2019-04-18 ENCOUNTER — Encounter: Payer: Self-pay | Admitting: Nurse Practitioner

## 2019-04-18 ENCOUNTER — Ambulatory Visit (INDEPENDENT_AMBULATORY_CARE_PROVIDER_SITE_OTHER): Payer: No Typology Code available for payment source | Admitting: Nurse Practitioner

## 2019-04-18 VITALS — Temp 98.5°F | Resp 16 | Wt 195.0 lb

## 2019-04-18 DIAGNOSIS — F4010 Social phobia, unspecified: Secondary | ICD-10-CM

## 2019-04-18 DIAGNOSIS — E1129 Type 2 diabetes mellitus with other diabetic kidney complication: Secondary | ICD-10-CM

## 2019-04-18 DIAGNOSIS — K760 Fatty (change of) liver, not elsewhere classified: Secondary | ICD-10-CM

## 2019-04-18 DIAGNOSIS — I10 Essential (primary) hypertension: Secondary | ICD-10-CM | POA: Diagnosis not present

## 2019-04-18 DIAGNOSIS — R809 Proteinuria, unspecified: Secondary | ICD-10-CM

## 2019-04-18 DIAGNOSIS — E782 Mixed hyperlipidemia: Secondary | ICD-10-CM | POA: Insufficient documentation

## 2019-04-18 DIAGNOSIS — E038 Other specified hypothyroidism: Secondary | ICD-10-CM | POA: Diagnosis not present

## 2019-04-18 DIAGNOSIS — M62838 Other muscle spasm: Secondary | ICD-10-CM

## 2019-04-18 MED ORDER — FLUOXETINE HCL 40 MG PO CAPS: 40.0000 mg | ORAL_CAPSULE | Freq: Every day | ORAL | 1 refills | Status: DC

## 2019-04-18 MED ORDER — CYCLOBENZAPRINE HCL 10 MG PO TABS: 10.0000 mg | ORAL_TABLET | Freq: Three times a day (TID) | ORAL | 1 refills | Status: DC | PRN

## 2019-04-18 MED ORDER — LOSARTAN POTASSIUM 50 MG PO TABS: 50.0000 mg | ORAL_TABLET | Freq: Every day | ORAL | 1 refills | Status: DC

## 2019-04-18 MED ORDER — ATORVASTATIN CALCIUM 20 MG PO TABS: 20.0000 mg | ORAL_TABLET | Freq: Every evening | ORAL | 1 refills | Status: DC
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
## 2019-04-19 MED FILL — LEVOTHYROXINE 25 MCG TABLET: 25 | 30 days supply | Qty: 30 | Fill #0

## 2019-05-08 ENCOUNTER — Ambulatory Visit (INDEPENDENT_AMBULATORY_CARE_PROVIDER_SITE_OTHER): Payer: No Typology Code available for payment source | Admitting: Family Medicine

## 2019-05-08 ENCOUNTER — Other Ambulatory Visit: Payer: Self-pay

## 2019-05-08 ENCOUNTER — Encounter: Payer: Self-pay | Admitting: Family Medicine

## 2019-05-08 DIAGNOSIS — J988 Other specified respiratory disorders: Secondary | ICD-10-CM

## 2019-05-08 DIAGNOSIS — R6889 Other general symptoms and signs: Secondary | ICD-10-CM

## 2019-05-08 DIAGNOSIS — Z20822 Contact with and (suspected) exposure to covid-19: Secondary | ICD-10-CM

## 2019-05-08 MED ORDER — BENZONATATE 100 MG PO CAPS: 100.0000 mg | ORAL_CAPSULE | Freq: Three times a day (TID) | ORAL | 0 refills | Status: DC | PRN

## 2019-05-08 MED ORDER — ALBUTEROL SULFATE HFA 108 (90 BASE) MCG/ACT IN AERS: 2.0000 | INHALATION_SPRAY | Freq: Four times a day (QID) | RESPIRATORY_TRACT | 0 refills | Status: DC | PRN

## 2019-05-08 MED ORDER — DOXYCYCLINE HYCLATE 100 MG PO TABS: 100.0000 mg | ORAL_TABLET | Freq: Two times a day (BID) | ORAL | 0 refills | Status: AC

## 2019-05-08 NOTE — Patient Instructions (Signed)
Person Under Monitoring Name: Julie Marquez  Location: [place] [address]   Infection Prevention Recommendations for Individuals Confirmed to have, or Being Evaluated for, 2019 Novel Coronavirus (COVID-19) Infection Who Receive Care at Home  Individuals who are confirmed to have, or are being evaluated for, COVID-19 should follow the prevention steps below until a healthcare provider or local or state health department says they can return to normal activities.  Stay home except to get medical care You should restrict activities outside your home, except for getting medical care. Do not go to work, school, or public areas, and do not use public transportation or taxis.  Call ahead before visiting your doctor Before your medical appointment, call the healthcare provider and tell them that you have, or are being evaluated for, COVID-19 infection. This will help the healthcare provider's office take steps to keep other people from getting infected. Ask your healthcare provider to call the local or state health department.  Monitor your symptoms Seek prompt medical attention if your illness is worsening (e.g., difficulty breathing). Before going to your medical appointment, call the healthcare provider and tell them that you have, or are being evaluated for, COVID-19 infection. Ask your healthcare provider to call the local or state health department.  Wear a facemask You should wear a facemask that covers your nose and mouth when you are in the same room with other people and when you visit a healthcare provider. People who live with or visit you should also wear a facemask while they are in the same room with you.  Separate yourself from other people in your home As much as possible, you should stay in a different room from other people in your home. Also, you should use a separate bathroom, if available.  Avoid sharing household items You should not  share dishes, drinking glasses, cups, eating utensils, towels, bedding, or other items with other people in your home. After using these items, you should wash them thoroughly with soap and water.  Cover your coughs and sneezes Cover your mouth and nose with a tissue when you cough or sneeze, or you can cough or sneeze into your sleeve. Throw used tissues in a lined trash can, and immediately wash your hands with soap and water for at least 20 seconds or use an alcohol-based hand rub.  Wash your [facility] your hands often and thoroughly with soap and water for at least 20 seconds. You can use an alcohol-based hand sanitizer if soap and water are not available and if your hands are not visibly dirty. Avoid touching your eyes, nose, and mouth with unwashed hands.   Prevention Steps for Caregivers and Household Members of Individuals Confirmed to have, or Being Evaluated for, COVID-19 Infection Being Cared for in the Home  If you live with, or provide care at home for, a person confirmed to have, or being evaluated for, COVID-19 infection please follow these guidelines to prevent infection:  Follow healthcare provider's instructions Make sure that you understand and can help the patient follow any healthcare provider instructions for all care.  Provide for the patient's basic needs You should help the patient with basic needs in the home and provide support for getting groceries, prescriptions, and other personal needs.  Monitor the patient's symptoms If they are getting sicker, call his or her medical provider and tell them that the patient has, or is being evaluated for, COVID-19 infection. This will help the healthcare provider's office  take steps to keep other people from getting infected. Ask the healthcare provider to call the local or state health department.  Limit the number of people who have contact with the patient  If possible, have only one caregiver for the patient.   Other household members should stay in another home or place of residence. If this is not possible, they should stay  in another room, or be separated from the patient as much as possible. Use a separate bathroom, if available.  Restrict visitors who do not have an essential need to be in the home.  Keep older adults, very young children, and other sick people away from the patient Keep older adults, very young children, and those who have compromised immune systems or chronic health conditions away from the patient. This includes people with chronic heart, lung, or kidney conditions, diabetes, and cancer.  Ensure good ventilation Make sure that shared spaces in the home have good air flow, such as from an air conditioner or an opened window, weather permitting.  Wash your hands often  Wash your hands often and thoroughly with soap and water for at least 20 seconds. You can use an alcohol based hand sanitizer if soap and water are not available and if your hands are not visibly dirty.  Avoid touching your eyes, nose, and mouth with unwashed hands.  Use disposable paper towels to dry your hands. If not available, use dedicated cloth towels and replace them when they become wet.  Wear a facemask and gloves  Wear a disposable facemask at all times in the room and gloves when you touch or have contact with the patient's blood, body fluids, and/or secretions or excretions, such as sweat, saliva, sputum, nasal mucus, vomit, urine, or feces.  Ensure the mask fits over your nose and mouth tightly, and do not touch it during use.  Throw out disposable facemasks and gloves after using them. Do not reuse.  Wash your hands immediately after removing your facemask and gloves.  If your personal clothing becomes contaminated, carefully remove clothing and launder. Wash your hands after handling contaminated clothing.  Place all used disposable facemasks, gloves, and other waste in a lined container  before disposing them with other household waste.  Remove gloves and wash your hands immediately after handling these items.  Do not share dishes, glasses, or other household items with the patient  Avoid sharing household items. You should not share dishes, drinking glasses, cups, eating utensils, towels, bedding, or other items with a patient who is confirmed to have, or being evaluated for, COVID-19 infection.  After the person uses these items, you should wash them thoroughly with soap and water.  Wash laundry thoroughly  Immediately remove and wash clothes or bedding that have blood, body fluids, and/or secretions or excretions, such as sweat, saliva, sputum, nasal mucus, vomit, urine, or feces, on them.  Wear gloves when handling laundry from the patient.  Read and follow directions on labels of laundry or clothing items and detergent. In general, wash and dry with the warmest temperatures recommended on the label.  Clean all areas the individual has used often  Clean all touchable surfaces, such as counters, tabletops, doorknobs, bathroom fixtures, toilets, phones, keyboards, tablets, and bedside tables, every day. Also, clean any surfaces that may have blood, body fluids, and/or secretions or excretions on them.  Wear gloves when cleaning surfaces the patient has come in contact with.  Use a diluted bleach solution (e.g., dilute bleach with 1 part  bleach and 10 parts water) or a household disinfectant with a label that says EPA-registered for coronaviruses. To make a bleach solution at home, add 1 tablespoon of bleach to 1 quart (4 cups) of water. For a larger supply, add  cup of bleach to 1 gallon (16 cups) of water.  Read labels of cleaning products and follow recommendations provided on product labels. Labels contain instructions for safe and effective use of the cleaning product including precautions you should take when applying the product, such as wearing gloves or eye  protection and making sure you have good ventilation during use of the product.  Remove gloves and wash hands immediately after cleaning.  Monitor yourself for signs and symptoms of illness Caregivers and household members are considered close contacts, should monitor their health, and will be asked to limit movement outside of the home to the extent possible. Follow the monitoring steps for close contacts listed on the symptom monitoring form.   ? If you have additional questions, contact your local health department or call the epidemiologist on call at [phone] (available 24/7). ? This guidance is subject to change. For the most up-to-date guidance from [facility], please refer to their website: [email or web address]

## 2019-05-08 NOTE — Progress Notes (Signed)
Name: Julie Marquez   MRN: [id number]    DOB: [birth year]   Date:05/08/2019       Progress Note  Subjective  Chief Complaint  Chief Complaint  Patient presents with  . Follow-up    nasal congestion, sob, fatigue seen at [facility] and was tested for Covid which was negative. This was 2nd test  . Cough    I connected with  Arlys L Ardito  on 05/08/19 at 11:40 AM EDT by a video enabled telemedicine application and verified that I am speaking with the correct person using two identifiers.  I discussed the limitations of evaluation and management by telemedicine and the availability of in person appointments. The patient expressed understanding and agreed to proceed. Staff also discussed with the patient that there may be a patient responsible charge related to this service. Patient Location: Home Provider Location: Home Additional Individuals present: None  HPI  Pt presents with concerns for COVID-19 symptoms and recent testing.  Symptoms started 05/02/2019.  She was tested once and had negative result on 05/06/2019, she [facility] and had a second test done which is still pending.  Symptoms include body aches, nasal congestion, fatigue, shortness of breath, dry cough.  Many of her symptoms have improved, but her congestion, cough, and shortness of breath at night. Denies fevers, chest pain.  She was not given any medications at [facility].   Has DM, but is very well controlled on Metformin.  Works on [employer] at [employer].   Patient Active Problem List   Diagnosis Date Noted  . Mixed hyperlipidemia 04/18/2019  . Compulsive gambling 12/03/2018  . Diabetes mellitus with microalbuminuria ([place]) 08/09/2018  . Obesity (BMI 30.0-34.9) 08/09/2018  . Tobacco abuse 05/09/2018  . 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  . Essential hypertension, benign 05/09/2018  . Anxiety 01/11/2017  . Vitamin D deficiency 01/11/2017  . Fatty liver 08/10/2016  . Hypothyroid 04/22/2016  .  Chronic neck pain 02/17/2016    Social History   Tobacco Use  . Smoking status: Current Every Day Smoker    Packs/day: 0.25    Years: 20.00    Pack years: 5.00    Types: Cigarettes  . Smokeless tobacco: Never Used  Substance Use Topics  . Alcohol use: Not Currently    Comment: occas     Current Outpatient Medications:  .  atorvastatin (LIPITOR) 20 MG tablet, Take 1 tablet (20 mg total) by mouth every evening., Disp: 90 tablet, Rfl: 1 .  cyclobenzaprine (FLEXERIL) 10 MG tablet, Take 1 tablet (10 mg total) by mouth every 8 (eight) hours as needed for muscle spasms., Disp: 90 tablet, Rfl: 1 .  FLUoxetine (PROZAC) 40 MG capsule, Take 1 capsule (40 mg total) by mouth daily., Disp: 90 capsule, Rfl: 1 .  fluticasone (FLONASE) 50 MCG/ACT nasal spray, Place 2 sprays into both nostrils daily as needed., Disp: 48 g, Rfl: 3 .  glucose blood (ACCU-CHEK GUIDE) test strip, Check fingerstick blood sugars once a day if desired; LON 99 months; Dx E11.9, Disp: 100 each, Rfl: 0 .  levothyroxine (SYNTHROID, LEVOTHROID) 25 MCG tablet, Take 1 tablet (25 mcg total) by mouth daily., Disp: 30 tablet, Rfl: 0 .  losartan (COZAAR) 50 MG tablet, Take 1 tablet (50 mg total) by mouth daily. For kidney protection and blood pressure, Disp: 90 tablet, Rfl: 1 .  metFORMIN (GLUCOPHAGE-XR) 500 MG 24 hr tablet, Take 1 tablet (500 mg total) by mouth 2 (two) times daily., Disp: 60 tablet, Rfl: 5 .  PARAGARD INTRAUTERINE COPPER IU, by Intrauterine route., Disp: , Rfl:  .  ReliOn Ultra Thin Lancets MISC, Check fingerstick blood sugars once a day if desired; LON 99 months, E11.9, Disp: 100 each, Rfl: 0 .  Vitamin D, Ergocalciferol, (DRISDOL) 1.25 MG (50000 UT) CAPS capsule, Take 1 capsule (50,000 Units total) by mouth every 7 (seven) days. (Patient not taking: Reported on 05/08/2019), Disp: 4 capsule, Rfl: 1  Allergies  Allergen Reactions  . Lisinopril     cough    I personally reviewed active problem list, medication list,  allergies, notes from last encounter, lab results with the patient/caregiver today.  ROS  Ten systems reviewed and is negative except as mentioned in HPI  Objective  Virtual encounter, vitals not obtained.  There is no height or weight on file to calculate BMI.  Nursing Note and Vital Signs reviewed.  Physical Exam  Constitutional: Patient appears well-developed and well-nourished. No distress.  HENT: Head: Normocephalic and atraumatic.  Neck: Normal range of motion. Pulmonary/Chest: Effort normal. No respiratory distress. Speaking in complete sentences. Congested cough present intermittently.  Neurological: Pt is alert and oriented to person, place, and time. Coordination, speech and gait are normal.  Psychiatric: Patient has a normal mood and affect. behavior is normal. Judgment and thought content normal.  No results found for this or any previous visit (from the past 72 hour(s)).  Assessment & Plan  1. Suspected Covid-19 Virus Infection - albuterol (VENTOLIN HFA) 108 (90 Base) MCG/ACT inhaler; Inhale 2 puffs into the lungs every 6 (six) hours as needed for wheezing or shortness of breath.  Dispense: 1 Inhaler; Refill: 0 - benzonatate (TESSALON PERLES) 100 MG capsule; Take 1 capsule (100 mg total) by mouth 3 (three) times daily as needed.  Dispense: 30 capsule; Refill: 0 - doxycycline (VIBRA-TABS) 100 MG tablet; Take 1 tablet (100 mg total) by mouth 2 (two) times daily for 7 days.  Dispense: 14 tablet; Refill: 0 - MyChart COVID-19 home monitoring program; Future - Temperature monitoring; Future  2. Respiratory infection - doxycycline (VIBRA-TABS) 100 MG tablet; Take 1 tablet (100 mg total) by mouth 2 (two) times daily for 7 days.  Dispense: 14 tablet; Refill: 0  -Red flags and when to present for emergency care or RTC including fever >101.69F, chest pain, shortness of breath, new/worsening/un-resolving symptoms, reviewed with patient at time of visit. Follow up and care  instructions discussed and provided in AVS. - I discussed the assessment and treatment plan with the patient. The patient was provided an opportunity to ask questions and all were answered. The patient agreed with the plan and demonstrated an understanding of the instructions.  I provided minutes of non-face-to-face time during this encounter.  Hubbard Hartshorn, FNP

## 2019-05-15 ENCOUNTER — Other Ambulatory Visit: Payer: Self-pay | Admitting: Family Medicine

## 2019-05-15 DIAGNOSIS — E038 Other specified hypothyroidism: Secondary | ICD-10-CM

## 2019-06-03 ENCOUNTER — Other Ambulatory Visit: Payer: Self-pay | Admitting: Family Medicine

## 2019-06-03 DIAGNOSIS — E038 Other specified hypothyroidism: Secondary | ICD-10-CM

## 2019-08-13 ENCOUNTER — Encounter: Payer: Self-pay | Admitting: Family Medicine

## 2019-08-14 ENCOUNTER — Encounter: Payer: Self-pay | Admitting: Family Medicine

## 2019-08-26 ENCOUNTER — Other Ambulatory Visit: Payer: Self-pay | Admitting: Family Medicine

## 2019-08-26 DIAGNOSIS — E038 Other specified hypothyroidism: Secondary | ICD-10-CM

## 2019-08-26 NOTE — Telephone Encounter (Signed)
Requested medication (s) are due for refill today: yes  Requested medication (s) are on the active medication list: yes  Last refill:  05/15/2019  Future visit scheduled: no  Notes to clinic: review for refill   Requested Prescriptions  Pending Prescriptions Disp Refills   levothyroxine (SYNTHROID) 25 MCG tablet [Pharmacy Med Name: LEVOTHYROXINE 25 MCG TABLET 25 TAB] 90 tablet 0    Sig: TAKE 1 TABLET BY MOUTH DAILY     Endocrinology:  Hypothyroid Agents Failed - 08/26/2019 10:28 AM      Failed - TSH needs to be rechecked within 3 months after an abnormal result. Refill until TSH is due.      Passed - TSH in normal range and within 360 days    TSH  Date Value Ref Range Status  01/11/2019 0.63 mIU/L Final    Comment:              Reference Range .           > or = 20 Years  0.40-4.50 .                Pregnancy Ranges           First trimester    0.26-2.66           Second trimester   0.55-2.73           Third trimester    0.43-2.91          Passed - Valid encounter within last 12 months    Recent Outpatient Visits          3 months ago Suspected Covid-19 Virus Infection   [place], FNP   4 months ago Essential hypertension, benign   [facility] [place], Bethel Born, NP   7 months ago Preventative health care   [facility] [place], Satira Anis, MD   8 months ago Diabetes mellitus with microalbuminuria [facility])   [place], Satira Anis, MD   1 year ago Diabetes mellitus with microalbuminuria [facility])   [facility] [place], Satira Anis, MD

## 2019-08-27 NOTE — Telephone Encounter (Signed)
Needs appt scheduled for November. Thanks!

## 2019-08-27 NOTE — Telephone Encounter (Signed)
LVM to schedule

## 2019-10-30 ENCOUNTER — Encounter: Payer: Self-pay | Admitting: Family Medicine

## 2019-11-04 ENCOUNTER — Other Ambulatory Visit: Payer: Self-pay | Admitting: Family Medicine

## 2019-11-04 DIAGNOSIS — E1129 Type 2 diabetes mellitus with other diabetic kidney complication: Secondary | ICD-10-CM

## 2019-11-04 DIAGNOSIS — F4010 Social phobia, unspecified: Secondary | ICD-10-CM

## 2019-11-04 DIAGNOSIS — R809 Proteinuria, unspecified: Secondary | ICD-10-CM

## 2019-11-04 DIAGNOSIS — K760 Fatty (change of) liver, not elsewhere classified: Secondary | ICD-10-CM

## 2019-11-04 NOTE — Telephone Encounter (Signed)
Requested medication (s) are due for refill today: yes  Requested medication (s) are on the active medication list: yes  Last refill:  08/01/2019  Future visit scheduled: no  Notes to clinic:  LOV-05/08/2019 Review for refills Last saw Julie Marquez  Requested Prescriptions  Pending Prescriptions Disp Refills   FLUoxetine (PROZAC) 40 MG capsule [Pharmacy Med Name: FLUoxetine HCL 40 MG CAPS 40 Capsule] 90 capsule 1    Sig: TAKE 1 CAPSULE BY MOUTH DAILY.      Psychiatry:  Antidepressants - SSRI Passed - 11/04/2019  9:26 AM      Passed - Valid encounter within last 6 months    Recent Outpatient Visits           6 months ago Suspected Covid-19 Virus Infection   [place], FNP   6 months ago Essential hypertension, benign   [facility] [place], Bethel Born, NP   9 months ago Preventative health care   [facility] [place], Satira Anis, MD   11 months ago Diabetes mellitus with microalbuminuria [facility])   [facility] [place], Satira Anis, MD   1 year ago Diabetes mellitus with microalbuminuria [facility])   [facility] [place], Satira Anis, MD                atorvastatin (LIPITOR) 20 MG tablet [Pharmacy Med Name: ATORVASTATIN CALCIUM 20 MG 20 Tablet] 90 tablet 1    Sig: TAKE 1 TABLET BY MOUTH DAILY EVERY EVENING.      Cardiovascular:  Antilipid - Statins Failed - 11/04/2019  9:26 AM      Failed - Triglycerides in normal range and within 360 days    Triglycerides  Date Value Ref Range Status  12/03/2018 205 (H) <150 mg/dL Final    Comment:    . If a non-fasting specimen was collected, consider repeat triglyceride testing on a fasting specimen if clinically indicated.  Yates Decamp et al. J. of Clin. Lipidol. [id number]. Marland Kitchen           Passed - Total Cholesterol in normal range and within 360 days    Cholesterol, Total  Date Value Ref Range Status  04/20/2016 205 (H) 100 -  199 mg/dL Final   Cholesterol  Date Value Ref Range Status  12/03/2018 161 <200 mg/dL Final          Passed - LDL in normal range and within 360 days    LDL Cholesterol (Calc)  Date Value Ref Range Status  12/03/2018 77 mg/dL (calc) Final    Comment:    Reference range: <100 . Desirable range <100 mg/dL for primary prevention;   <70 mg/dL for patients with CHD or diabetic patients  with > or = 2 CHD risk factors. Marland Kitchen LDL-C is now calculated using the Martin-Hopkins  calculation, which is a validated novel method providing  better accuracy than the Friedewald equation in the  estimation of LDL-C.  Cresenciano Genre et al. Annamaria Helling. [id number]): 2061-2068  (http://education.[employer].com/faq/FAQ164)           Passed - HDL in normal range and within 360 days    HDL  Date Value Ref Range Status  12/03/2018 54 >50 mg/dL Final  04/20/2016 47 >39 mg/dL Final          Passed - Patient is not pregnant      Passed - Valid encounter within last 12 months    Recent Outpatient Visits  6 months ago Suspected Covid-19 Virus Infection   [place], FNP   6 months ago Essential hypertension, benign   [facility] [place], Bethel Born, NP   9 months ago Preventative health care   [facility] [place], Satira Anis, MD   11 months ago Diabetes mellitus with microalbuminuria [facility])   [place], Satira Anis, MD   1 year ago Diabetes mellitus with microalbuminuria [facility])   [place], Satira Anis, MD

## 2019-11-05 NOTE — Telephone Encounter (Signed)
Please schedule patient for follow up in the next 15 days.

## 2019-11-06 NOTE — Telephone Encounter (Signed)
lvm to [place] appt in 15 days

## 2019-11-26 ENCOUNTER — Other Ambulatory Visit: Payer: Self-pay | Admitting: Family Medicine

## 2019-11-26 DIAGNOSIS — E1129 Type 2 diabetes mellitus with other diabetic kidney complication: Secondary | ICD-10-CM

## 2019-11-26 DIAGNOSIS — I10 Essential (primary) hypertension: Secondary | ICD-10-CM

## 2019-11-26 DIAGNOSIS — F4010 Social phobia, unspecified: Secondary | ICD-10-CM

## 2019-11-26 NOTE — Telephone Encounter (Signed)
Requested medication (s) are due for refill today: yes  Requested medication (s) are on the active medication list: yes  Last refill:  11/06/2019  Future visit scheduled:yes  Notes to clinic: no valid encounter within last 6 months Review for refill  Requested Prescriptions  Pending Prescriptions Disp Refills   FLUoxetine (PROZAC) 40 MG capsule [Pharmacy Med Name: FLUoxetine HCL 40 MG CAPS 40 Capsule] 15 capsule 0    Sig: TAKE 1 CAPSULE BY MOUTH DAILY.      Psychiatry:  Antidepressants - SSRI Failed - 11/26/2019 10:07 AM      Failed - Valid encounter within last 6 months    Recent Outpatient Visits           6 months ago Suspected Covid-19 Virus Infection   [place], [place], FNP   7 months ago Essential hypertension, benign   [place], NP   10 months ago Preventative health care   [place], MD   11 months ago Diabetes mellitus with microalbuminuria [facility])   [place], Satira Anis, MD   1 year ago Diabetes mellitus with microalbuminuria [facility])   [place], Satira Anis, MD       Future Appointments             In 3 days Hubbard Hartshorn, [facility], [place]              losartan (COZAAR) 50 MG tablet [Pharmacy Med Name: LOSARTAN POTASSIUM 50 MG TA 50 Tablet] 90 tablet 1    Sig: TAKE 1 TABLET BY MOUTH DAILY FOR KIDNEY PROTECTION AND BLOOD PRESSURE      Cardiovascular:  Angiotensin Receptor Blockers Failed - 11/26/2019 10:07 AM      Failed - Cr in normal range and within 180 days    Creat  Date Value Ref Range Status  01/11/2019 0.67 0.50 - 1.10 mg/dL Final   Creatinine, Urine  Date Value Ref Range Status  12/03/2018 144 20 - 275 mg/dL Final          Failed - K in normal range and within 180 days    Potassium  Date Value Ref Range Status  01/11/2019 4.2 3.5 - 5.3 mmol/L Final          Failed - Valid encounter within last 6 months    Recent Outpatient Visits           6 months ago Suspected Covid-19 Virus Infection   [place], FNP   7 months ago Essential hypertension, benign   [place], NP   10 months ago Preventative health care   [place], MD   11 months ago Diabetes mellitus with microalbuminuria [facility])   [place], Satira Anis, MD   1 year ago Diabetes mellitus with microalbuminuria [facility])   [place], Satira Anis, MD       Future Appointments             In 3 days Hubbard Hartshorn, [facility], [place] - Patient is not pregnant      Passed - Last BP in normal range    BP Readings from Last 1 Encounters:  01/11/19 128/78

## 2019-11-29 ENCOUNTER — Ambulatory Visit (INDEPENDENT_AMBULATORY_CARE_PROVIDER_SITE_OTHER): Payer: No Typology Code available for payment source | Admitting: Family Medicine

## 2019-11-29 ENCOUNTER — Other Ambulatory Visit: Payer: Self-pay

## 2019-11-29 ENCOUNTER — Encounter: Payer: Self-pay | Admitting: Family Medicine

## 2019-11-29 DIAGNOSIS — I10 Essential (primary) hypertension: Secondary | ICD-10-CM | POA: Diagnosis not present

## 2019-11-29 DIAGNOSIS — R002 Palpitations: Secondary | ICD-10-CM

## 2019-11-29 DIAGNOSIS — E782 Mixed hyperlipidemia: Secondary | ICD-10-CM

## 2019-11-29 DIAGNOSIS — E1129 Type 2 diabetes mellitus with other diabetic kidney complication: Secondary | ICD-10-CM

## 2019-11-29 DIAGNOSIS — R809 Proteinuria, unspecified: Secondary | ICD-10-CM

## 2019-11-29 DIAGNOSIS — F419 Anxiety disorder, unspecified: Secondary | ICD-10-CM

## 2019-11-29 DIAGNOSIS — R0602 Shortness of breath: Secondary | ICD-10-CM

## 2019-11-29 DIAGNOSIS — R0789 Other chest pain: Secondary | ICD-10-CM

## 2019-11-29 DIAGNOSIS — E039 Hypothyroidism, unspecified: Secondary | ICD-10-CM | POA: Diagnosis not present

## 2019-11-29 MED ORDER — FLUOXETINE HCL 40 MG PO CAPS: 40.0000 mg | ORAL_CAPSULE | Freq: Every day | ORAL | 1 refills | Status: DC

## 2019-11-29 MED ORDER — ATORVASTATIN CALCIUM 20 MG PO TABS: ORAL_TABLET | ORAL | 1 refills | Status: DC

## 2019-11-29 MED ORDER — ALBUTEROL SULFATE HFA 108 (90 BASE) MCG/ACT IN AERS: 2.0000 | INHALATION_SPRAY | Freq: Four times a day (QID) | RESPIRATORY_TRACT | 2 refills | Status: DC | PRN

## 2019-11-29 NOTE — Progress Notes (Signed)
Name: Julie Marquez   MRN: [id number]    DOB: 01-01-1971   Date:11/29/2019       Progress Note  Subjective  Chief Complaint  Chief Complaint  Patient presents with  . Medication Refill  . Palpitations    chest tightness on left side. resolved now  . Diabetes  . Hyperlipidemia  . Hypothyroidism    I connected with  Julie Marquez  on 11/29/19 at 11:20 AM EST by a video enabled telemedicine application and verified that I am speaking with the correct person using two identifiers.  I discussed the limitations of evaluation and management by telemedicine and the availability of in person appointments. The patient expressed understanding and agreed to proceed. Staff also discussed with the patient that there may be a patient responsible charge related to this service. Patient Location: Home Provider Location: Office Additional Individuals present: None  HPI  Pt presents with the following concerns:  Palpitations: A couple of weeks ago she had palpitations and left sided chest discomfort along with intermittent left leg numbness, some shortness of breath.  She notes this was constant for a few days then completely resolved - asymptomatic for over a week now.  She has history ongoing fatigue, nothing worse than normal during.  During these episodes she did not have diaphoresis, lightheadedness, dizziness.  She states BG's were within normal range and was unable to check BP during these episodes.   HTN: Taking losartan 50mg  daily.  Has noticed some very mild trace edema that is dependent.  Chest discomfort, palpitations, and shortness of breath as above.   HLD: Taking atorvastatin 20mg , due for labs.  Chest discomfort and palpitations as above, no myalgias.  DM:  Taking metformin 500mg  BID, no SE's from medication reported.  Does endorse occasional polydipsia; denies polyuria, polyphasia.  She is checking BG's every now and then - running between 70-90 with no hypoglycemic symptoms.    On statin, ARB, and aspirin.  Due for eye exam - she is reminded.  Due for urine micro.   Hypothyroidism: Palpitations as above. Denies heat/cold intolerance, hair/skin/nail changes, diarrhea, weight changes.  Does endorse some constipation - ongoing issue - takes miralax PRN as well.  Anxiety: Taking prozac and doing well on this.  Feels symptoms are very well controlled, will continue current regimen.  Patient Active Problem List   Diagnosis Date Noted  . Mixed hyperlipidemia 04/18/2019  . Compulsive gambling 12/03/2018  . Diabetes mellitus with microalbuminuria ([place]) 08/09/2018  . Obesity (BMI 30.0-34.9) 08/09/2018  . Tobacco abuse 05/09/2018  . 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  . Essential hypertension, benign 05/09/2018  . Anxiety 01/11/2017  . Vitamin D deficiency 01/11/2017  . Fatty liver 08/10/2016  . Hypothyroid 04/22/2016  . Chronic neck pain 02/17/2016    Past Surgical History:  Procedure Laterality Date  . THYROIDECTOMY  2005    Family History  Problem Relation Age of Onset  . Heart disease Father   . Alcohol abuse Father   . Cancer Maternal Grandmother        breast  . Cancer Paternal Grandmother        breast  . Breast cancer Paternal Grandmother   . Dementia Mother   . Thyroid disease Sister   . Alcohol abuse Brother   . Alcohol abuse Brother     Social History   Socioeconomic History  . Marital status: Married    Spouse name: Julie Marquez  . Number of children: 1  . Years of education: Not  on file  . Highest education level: Associate degree: occupational, Hotel manager, or vocational program  Occupational History  . Not on file  Tobacco Use  . Smoking status: Current Every Day Smoker    Packs/day: 0.25    Years: 20.00    Pack years: 5.00    Types: Cigarettes  . Smokeless tobacco: Never Used  Substance and Sexual Activity  . Alcohol use: Not Currently    Comment: occas  . Drug use: No  . Sexual activity: Yes    Birth control/protection: I.U.D.     Comment: paragard  Other Topics Concern  . Not on file  Social History Narrative  . Not on file   Social Determinants of Health   Financial Resource Strain: Low Risk   . Difficulty of Paying Living Expenses: Not hard at all  Food Insecurity: No Food Insecurity  . Worried About Charity fundraiser in the Last Year: Never true  . Ran Out of Food in the Last Year: Never true  Transportation Needs: No Transportation Needs  . Lack of Transportation (Medical): No  . Lack of Transportation (Non-Medical): No  Physical Activity: Sufficiently Active  . Days of Exercise per Week: 3 days  . Minutes of Exercise per Session: 60 min  Stress: No Stress Concern Present  . Feeling of Stress : Not at all  Social Connections: Unknown  . Frequency of Communication with Friends and Family: Not on file  . Frequency of Social Gatherings with Friends and Family: Not on file  . Attends Religious Services: More than 4 times per year  . Active Member of Clubs or Organizations: No  . Attends Archivist Meetings: Never  . Marital Status: Married  Human resources officer Violence: Not At Risk  . Fear of Current or Ex-Partner: No  . Emotionally Abused: No  . Physically Abused: No  . Sexually Abused: No     Current Outpatient Medications:  .  albuterol (VENTOLIN HFA) 108 (90 Base) MCG/ACT inhaler, Inhale 2 puffs into the lungs every 6 (six) hours as needed for wheezing or shortness of breath., Disp: 1 Inhaler, Rfl: 0 .  atorvastatin (LIPITOR) 20 MG tablet, TAKE 1 TABLET BY MOUTH DAILY EVERY EVENING., Disp: 15 tablet, Rfl: 0 .  benzonatate (TESSALON PERLES) 100 MG capsule, Take 1 capsule (100 mg total) by mouth 3 (three) times daily as needed., Disp: 30 capsule, Rfl: 0 .  cyclobenzaprine (FLEXERIL) 10 MG tablet, Take 1 tablet (10 mg total) by mouth every 8 (eight) hours as needed for muscle spasms., Disp: 90 tablet, Rfl: 1 .  FLUoxetine (PROZAC) 40 MG capsule, TAKE 1 CAPSULE BY MOUTH DAILY., Disp: 15  capsule, Rfl: 0 .  fluticasone (FLONASE) 50 MCG/ACT nasal spray, Place 2 sprays into both nostrils daily as needed., Disp: 48 g, Rfl: 3 .  glucose blood (ACCU-CHEK GUIDE) test strip, Check fingerstick blood sugars once a day if desired; LON 99 months; Dx E11.9, Disp: 100 each, Rfl: 0 .  levothyroxine (SYNTHROID) 25 MCG tablet, TAKE 1 TABLET BY MOUTH DAILY, Disp: 90 tablet, Rfl: 0 .  losartan (COZAAR) 50 MG tablet, Take 1 tablet (50 mg total) by mouth daily. For kidney protection and blood pressure, Disp: 90 tablet, Rfl: 1 .  metFORMIN (GLUCOPHAGE-XR) 500 MG 24 hr tablet, Take 1 tablet (500 mg total) by mouth 2 (two) times daily., Disp: 60 tablet, Rfl: 5 .  PARAGARD INTRAUTERINE COPPER IU, by Intrauterine route., Disp: , Rfl:  .  ReliOn Ultra Thin Lancets MISC, Check  fingerstick blood sugars once a day if desired; LON 99 months, E11.9, Disp: 100 each, Rfl: 0 .  Vitamin D, Ergocalciferol, (DRISDOL) 1.25 MG (50000 UT) CAPS capsule, Take 1 capsule (50,000 Units total) by mouth every 7 (seven) days. (Patient not taking: Reported on 05/08/2019), Disp: 4 capsule, Rfl: 1  Allergies  Allergen Reactions  . Lisinopril     cough    I personally reviewed active problem list, medication list, allergies, notes from last encounter, lab results with the patient/caregiver today.   ROS  Ten systems reviewed and is negative except as mentioned in HPI  Objective  Virtual encounter, vitals not obtained.  There is no height or weight on file to calculate BMI.  Physical Exam  Pulmonary/Chest: Effort normal. No respiratory distress. Speaking in complete sentences Neurological: Pt is alert and oriented to person, place, and time. Coordination, speech and gait are normal.  Psychiatric: Patient has a normal mood and affect. behavior is normal. Judgment and thought content normal.  No results found for this or any previous visit (from the past 72 hour(s)).  PHQ2/9: Depression screen [facility] 2/9 11/29/2019  05/08/2019 04/18/2019 01/11/2019 12/03/2018  Decreased Interest 0 1 0 0 0  Down, Depressed, Hopeless 0 0 0 3 0  PHQ - 2 Score 0 1 0 3 0  Altered sleeping 0 0 0 0 0  Tired, decreased energy 3 0 0 3 0  Change in appetite 0 0 0 3 0  Feeling bad or failure about yourself  0 0 0 0 0  Trouble concentrating 0 0 0 3 0  Moving slowly or fidgety/restless 0 0 0 0 0  Suicidal thoughts 0 0 0 0 0  PHQ-9 Score 3 1 0 12 0  Difficult doing work/chores Not difficult at all Not difficult at all Not difficult at all Not difficult at all Not difficult at all   PHQ-2/9 Result is negative.    Fall Risk: Fall Risk  11/29/2019 05/08/2019 04/18/2019 01/11/2019 12/03/2018  Falls in the past year? 0 0 0 0 0  Number falls in past yr: 0 0 - 0 -  Injury with Fall? 0 0 - 0 -  Follow up Falls evaluation completed Falls evaluation completed - - -    Assessment & Plan 1. Essential hypertension, benign - Per patient's report this has been stable, concerned about recent palpitation and chest discomfort, however so referring to cardiology for evaluation - COMPLETE METABOLIC PANEL WITH GFR - Ambulatory referral to Cardiology  2. Mixed hyperlipidemia - Lipid panel - Ambulatory referral to Cardiology  3. Diabetes mellitus with microalbuminuria ([facility]) - atorvastatin (LIPITOR) 20 MG tablet; TAKE 1 TABLET BY MOUTH DAILY EVERY EVENING.  Dispense: 90 tablet; Refill: 1 - Hemoglobin A1c - Microalbumin / creatinine urine ratio  4. Acquired hypothyroidism - TSH  5. Palpitation - She was not able to come in to office today due to inclement weather, UTA EKG or physical examination.  Will obtain labs next week, strongly advised to seek emergency care should palpitations with chest discomfort and shortness of breath return. - Lipid panel - TSH  - COMPLETE METABOLIC PANEL WITH GFR - CBC with Differential/Platelet - Ambulatory referral to Cardiology  6. Chest discomfort - See above for teaching - CBC with Differential/Platelet -  Ambulatory referral to Cardiology - albuterol (VENTOLIN HFA) 108 (90 Base) MCG/ACT inhaler; Inhale 2 puffs into the lungs every 6 (six) hours as needed for wheezing or shortness of breath.  Dispense: 18 g; Refill: 2  7.  Anxiety - FLUoxetine (PROZAC) 40 MG capsule; Take 1 capsule (40 mg total) by mouth daily.  Dispense: 90 capsule; Refill: 1  8. Shortness of breath - albuterol (VENTOLIN HFA) 108 (90 Base) MCG/ACT inhaler; Inhale 2 puffs into the lungs every 6 (six) hours as needed for wheezing or shortness of breath.  Dispense: 18 g; Refill: 2    I discussed the assessment and treatment plan with the patient. The patient was provided an opportunity to ask questions and all were answered. The patient agreed with the plan and demonstrated an understanding of the instructions.  The patient was advised to call back or seek an in-person evaluation if the symptoms worsen or if the condition fails to improve as anticipated.  I provided 23 minutes of non-face-to-face time during this encounter.

## 2019-12-04 ENCOUNTER — Other Ambulatory Visit: Payer: Self-pay | Admitting: Family Medicine

## 2019-12-04 DIAGNOSIS — E038 Other specified hypothyroidism: Secondary | ICD-10-CM

## 2019-12-04 DIAGNOSIS — E1129 Type 2 diabetes mellitus with other diabetic kidney complication: Secondary | ICD-10-CM

## 2019-12-04 DIAGNOSIS — I10 Essential (primary) hypertension: Secondary | ICD-10-CM

## 2019-12-05 ENCOUNTER — Encounter: Payer: Self-pay | Admitting: Family Medicine

## 2019-12-10 ENCOUNTER — Other Ambulatory Visit
Admission: RE | Admit: 2019-12-10 | Discharge: 2019-12-10 | Disposition: A | Discharge status: Home / Self Care | Payer: No Typology Code available for payment source | Source: Ambulatory Visit | Attending: Family Medicine | Admitting: Family Medicine

## 2019-12-10 ENCOUNTER — Other Ambulatory Visit: Payer: Self-pay

## 2019-12-10 DIAGNOSIS — R809 Proteinuria, unspecified: Secondary | ICD-10-CM | POA: Diagnosis present

## 2019-12-10 DIAGNOSIS — E039 Hypothyroidism, unspecified: Secondary | ICD-10-CM

## 2019-12-10 DIAGNOSIS — E1129 Type 2 diabetes mellitus with other diabetic kidney complication: Secondary | ICD-10-CM | POA: Diagnosis not present

## 2019-12-10 DIAGNOSIS — E782 Mixed hyperlipidemia: Secondary | ICD-10-CM

## 2019-12-10 DIAGNOSIS — R002 Palpitations: Secondary | ICD-10-CM | POA: Insufficient documentation

## 2019-12-10 DIAGNOSIS — I10 Essential (primary) hypertension: Secondary | ICD-10-CM | POA: Diagnosis present

## 2019-12-10 LAB — CBC WITH DIFFERENTIAL/PLATELET
Abs Immature Granulocytes: 0.03 10*3/uL (ref 0.00–0.07)
Basophils Absolute: 0.1 10*3/uL (ref 0.0–0.1)
Basophils Relative: 1 %
Eosinophils Absolute: 0.3 10*3/uL (ref 0.0–0.5)
Eosinophils Relative: 3 %
HCT: 35.8 % — ABNORMAL LOW (ref 36.0–46.0)
Hemoglobin: 11.6 g/dL — ABNORMAL LOW (ref 12.0–15.0)
Immature Granulocytes: 0 %
Lymphocytes Relative: 27 %
Lymphs Abs: 2.5 10*3/uL (ref 0.7–4.0)
MCH: 27.3 pg (ref 26.0–34.0)
MCHC: 32.4 g/dL (ref 30.0–36.0)
MCV: 84.2 fL (ref 80.0–100.0)
Monocytes Absolute: 0.9 10*3/uL (ref 0.1–1.0)
Monocytes Relative: 9 %
Neutro Abs: 5.6 10*3/uL (ref 1.7–7.7)
Neutrophils Relative %: 60 %
Platelets: 261 10*3/uL (ref 150–400)
RBC: 4.25 MIL/uL (ref 3.87–5.11)
RDW: 14.5 % (ref 11.5–15.5)
WBC: 9.4 10*3/uL (ref 4.0–10.5)
nRBC: 0 % (ref 0.0–0.2)

## 2019-12-10 LAB — LIPID PANEL
Cholesterol: 167 mg/dL (ref 0–200)
HDL: 43 mg/dL (ref >40)
LDL Cholesterol: 87 mg/dL (ref 0–99)
Total CHOL/HDL Ratio: 3.9 RATIO
Triglycerides: 187 mg/dL — ABNORMAL HIGH (ref <150)
VLDL: 37 mg/dL (ref 0–40)

## 2019-12-10 LAB — COMPREHENSIVE METABOLIC PANEL
ALT: 18 U/L (ref 0–44)
AST: 19 U/L (ref 15–41)
Albumin: 3.6 g/dL (ref 3.5–5.0)
Alkaline Phosphatase: 67 U/L (ref 38–126)
Anion gap: 10 (ref 5–15)
BUN: 13 mg/dL (ref 6–20)
CO2: 25 mmol/L (ref 22–32)
Calcium: 8.9 mg/dL (ref 8.9–10.3)
Chloride: 102 mmol/L (ref 98–111)
Creatinine, Ser: 0.73 mg/dL (ref 0.44–1.00)
GFR calc Af Amer: >60 mL/min (ref >60)
GFR calc non Af Amer: >60 mL/min (ref >60)
Glucose, Bld: 97 mg/dL (ref 70–99)
Potassium: 3.7 mmol/L (ref 3.5–5.1)
Sodium: 137 mmol/L (ref 135–145)
Total Bilirubin: 0.6 mg/dL (ref 0.3–1.2)
Total Protein: 6.9 g/dL (ref 6.5–8.1)

## 2019-12-10 LAB — HEMOGLOBIN A1C
Hgb A1c MFr Bld: 6 % — ABNORMAL HIGH (ref 4.8–5.6)
Mean Plasma Glucose: 125.5 mg/dL

## 2019-12-10 LAB — TSH: TSH: 0.791 u[IU]/mL (ref 0.350–4.500)

## 2019-12-11 LAB — MICROALBUMIN / CREATININE URINE RATIO
Creatinine, Urine: 97.3 mg/dL
Microalb Creat Ratio: 37 mg/g creat — ABNORMAL HIGH (ref 0–29)
Microalb, Ur: 36.4 ug/mL — ABNORMAL HIGH

## 2019-12-23 ENCOUNTER — Other Ambulatory Visit: Payer: Self-pay

## 2019-12-23 ENCOUNTER — Encounter: Payer: Self-pay | Admitting: Cardiology

## 2019-12-23 ENCOUNTER — Telehealth: Payer: Self-pay | Admitting: Cardiology

## 2019-12-23 ENCOUNTER — Ambulatory Visit (INDEPENDENT_AMBULATORY_CARE_PROVIDER_SITE_OTHER): Payer: No Typology Code available for payment source | Admitting: Cardiology

## 2019-12-23 VITALS — BP 130/90 | HR 90 | Ht 63.5 in | Wt 203.0 lb

## 2019-12-23 DIAGNOSIS — R072 Precordial pain: Secondary | ICD-10-CM

## 2019-12-23 DIAGNOSIS — R0600 Dyspnea, unspecified: Secondary | ICD-10-CM | POA: Diagnosis not present

## 2019-12-23 DIAGNOSIS — R079 Chest pain, unspecified: Secondary | ICD-10-CM | POA: Diagnosis not present

## 2019-12-23 DIAGNOSIS — E7800 Pure hypercholesterolemia, unspecified: Secondary | ICD-10-CM | POA: Diagnosis not present

## 2019-12-23 DIAGNOSIS — F17200 Nicotine dependence, unspecified, uncomplicated: Secondary | ICD-10-CM | POA: Diagnosis not present

## 2019-12-23 DIAGNOSIS — R0609 Other forms of dyspnea: Secondary | ICD-10-CM

## 2019-12-23 MED ORDER — ATORVASTATIN CALCIUM 40 MG PO TABS: 40.0000 mg | ORAL_TABLET | Freq: Every day | ORAL | 3 refills | Status: DC

## 2019-12-23 NOTE — Patient Instructions (Addendum)
Medication Instructions:  - Your physician has recommended you make the following change in your medication:   1) Increase lipitor (atorvastatin) to 40 mg- take 1 tablet (40 mg) by mouth once a day  *If you need a refill on your cardiac medications before your next appointment, please call your pharmacy*  Lab Work: - none ordered  If you have labs (blood work) drawn today and your tests are completely normal, you will receive your results only by: [facility] MyChart Message (if you have MyChart) OR . A paper copy in the mail If you have any lab test that is abnormal or we need to change your treatment, we will call you to review the results.  Testing/Procedures: - Your physician has requested that you have an echocardiogram. Echocardiography is a painless test that uses sound waves to create images of your heart. It provides your doctor with information about the size and shape of your heart and how well your heart's chambers and valves are working. This procedure takes approximately one hour. There are no restrictions for this procedure.  - Your physician has requested that you have a lexiscan myoview.  [place]  Your caregiver has ordered a Stress Test with nuclear imaging. The purpose of this test is to evaluate the blood supply to your heart muscle. This procedure is referred to as a "Non-Invasive Stress Test." This is because other than having an IV started in your vein, nothing is inserted or "invades" your body. Cardiac stress tests are done to find areas of poor blood flow to the heart by determining the extent of coronary artery disease (CAD). Some patients exercise on a treadmill, which naturally increases the blood flow to your heart, while others who are  unable to walk on a treadmill due to physical limitations have a pharmacologic/chemical stress agent called Lexiscan . This medicine will mimic walking on a treadmill by temporarily increasing your coronary blood flow.   Please note:  these test may take anywhere between 2-4 hours to complete  PLEASE REPORT TO [place] AT THE FIRST DESK WILL DIRECT YOU WHERE TO GO  Date of Procedure:_____________________________________  Arrival Time for Procedure:______________________________  Instructions regarding medication:   __x__ : Hold diabetes medication morning of procedure (oral & insulin)  _x___:  You may take all of your other regular morning medications the day of your test as normal with enough water to get them down safely.  PLEASE NOTIFY THE OFFICE AT LEAST 63 HOURS IN ADVANCE IF YOU ARE UNABLE TO KEEP YOUR APPOINTMENT.  [phone] AND  PLEASE NOTIFY NUCLEAR MEDICINE AT [facility] AT LEAST 24 HOURS IN ADVANCE IF YOU ARE UNABLE TO KEEP YOUR APPOINTMENT. [phone]  How to prepare for your Myoview test:  1. Do not eat or drink after midnight 2. No caffeine for 24 hours prior to test 3. No smoking 24 hours prior to test. 4. Your medication may be taken with water.  If your doctor stopped a medication because of this test, do not take that medication. 5. Ladies, please do not wear dresses.  Skirts or pants are appropriate. Please wear a short sleeve shirt. 6. No perfume, cologne or lotion. 7. Wear comfortable walking shoes. No heels!   Follow-Up: At [facility], you and your health needs are our priority.  As part of our continuing mission to provide you with exceptional heart care, we have created designated Provider Care Teams.  These Care Teams include your primary Cardiologist (physician) and  Advanced Practice Providers (APPs -  Physician Assistants and Nurse Practitioners) who all work together to provide you with the care you need, when you need it.  Your next appointment:   5-6 week(s)/ after cardiac testing is completed  The format for your next appointment:   In Person  Provider:   Kate Sable, MD  Other Instructions N/a   Echocardiogram An  echocardiogram is a procedure that uses painless sound waves (ultrasound) to produce an image of the heart. Images from an echocardiogram can provide important information about:  Signs of coronary artery disease (CAD).  Aneurysm detection. An aneurysm is a weak or damaged part of an artery wall that bulges out from the normal force of blood pumping through the body.  Heart size and shape. Changes in the size or shape of the heart can be associated with certain conditions, including heart failure, aneurysm, and CAD.  Heart muscle function.  Heart valve function.  Signs of a past heart attack.  Fluid buildup around the heart.  Thickening of the heart muscle.  A tumor or infectious growth around the heart valves. Tell a health care provider about:  Any allergies you have.  All medicines you are taking, including vitamins, herbs, eye drops, creams, and over-the-counter medicines.  Any blood disorders you have.  Any surgeries you have had.  Any medical conditions you have.  Whether you are pregnant or may be pregnant. What are the risks? Generally, this is a safe procedure. However, problems may occur, including:  Allergic reaction to dye (contrast) that may be used during the procedure. What happens before the procedure? No specific preparation is needed. You may eat and drink normally. What happens during the procedure?   An IV tube may be inserted into one of your veins.  You may receive contrast through this tube. A contrast is an injection that improves the quality of the pictures from your heart.  A gel will be applied to your chest.  A wand-like tool (transducer) will be moved over your chest. The gel will help to transmit the sound waves from the transducer.  The sound waves will harmlessly bounce off of your heart to allow the heart images to be captured in real-time motion. The images will be recorded on a computer. The procedure may vary among health care  providers and hospitals. What happens after the procedure?  You may return to your normal, everyday life, including diet, activities, and medicines, unless your health care provider tells you not to do that. Summary  An echocardiogram is a procedure that uses painless sound waves (ultrasound) to produce an image of the heart.  Images from an echocardiogram can provide important information about the size and shape of your heart, heart muscle function, heart valve function, and fluid buildup around your heart.  You do not need to do anything to prepare before this procedure. You may eat and drink normally.  After the echocardiogram is completed, you may return to your normal, everyday life, unless your health care provider tells you not to do that. This information is not intended to replace advice given to you by your health care provider. Make sure you discuss any questions you have with your health care provider. Document Revised: 02/28/2019 Document Reviewed: 12/10/2016 Elsevier Patient Education  2020 [place].    Cardiac Nuclear Scan A cardiac nuclear scan is a test that measures blood flow to the heart when a person is resting and when he or she is exercising. The  test looks for problems such as:  Not enough blood reaching a portion of the heart.  The heart muscle not working normally. You may need this test if:  You have heart disease.  You have had abnormal lab results.  You have had heart surgery or a balloon procedure to open up blocked arteries (angioplasty).  You have chest pain.  You have shortness of breath. In this test, a radioactive dye (tracer) is injected into your bloodstream. After the tracer has traveled to your heart, an imaging device is used to measure how much of the tracer is absorbed by or distributed to various areas of your heart. This procedure is usually done at a hospital and takes 2-4 hours. Tell a health care provider about:  Any  allergies you have.  All medicines you are taking, including vitamins, herbs, eye drops, creams, and over-the-counter medicines.  Any problems you or family members have had with anesthetic medicines.  Any blood disorders you have.  Any surgeries you have had.  Any medical conditions you have.  Whether you are pregnant or may be pregnant. What are the risks? Generally, this is a safe procedure. However, problems may occur, including:  Serious chest pain and heart attack. This is only a risk if the stress portion of the test is done.  Rapid heartbeat.  Sensation of warmth in your chest. This usually passes quickly.  Allergic reaction to the tracer. What happens before the procedure?  Ask your health care provider about changing or stopping your regular medicines. This is especially important if you are taking diabetes medicines or blood thinners.  Follow instructions from your health care provider about eating or drinking restrictions.  Remove your jewelry on the day of the procedure. What happens during the procedure?  An IV will be inserted into one of your veins.  Your health care provider will inject a small amount of radioactive tracer through the IV.  You will wait for 20-40 minutes while the tracer travels through your bloodstream.  Your heart activity will be monitored with an electrocardiogram (ECG).  You will lie down on an exam table.  Images of your heart will be taken for about 15-20 minutes.  You may also have a stress test. For this test, one of the following may be done: ? You will exercise on a treadmill or stationary bike. While you exercise, your heart's activity will be monitored with an ECG, and your blood pressure will be checked. ? You will be given medicines that will increase blood flow to parts of your heart. This is done if you are unable to exercise.  When blood flow to your heart has peaked, a tracer will again be injected through the  IV.  After 20-40 minutes, you will get back on the exam table and have more images taken of your heart.  Depending on the type of tracer used, scans may need to be repeated 3-4 hours later.  Your IV line will be removed when the procedure is over. The procedure may vary among health care providers and hospitals. What happens after the procedure?  Unless your health care provider tells you otherwise, you may return to your normal schedule, including diet, activities, and medicines.  Unless your health care provider tells you otherwise, you may increase your fluid intake. This will help to flush the contrast dye from your body. Drink enough fluid to keep your urine pale yellow.  Ask your health care provider, or the department that is doing  the test: ? When will my results be ready? ? How will I get my results? Summary  A cardiac nuclear scan measures the blood flow to the heart when a person is resting and when he or she is exercising.  Tell your health care provider if you are pregnant.  Before the procedure, ask your health care provider about changing or stopping your regular medicines. This is especially important if you are taking diabetes medicines or blood thinners.  After the procedure, unless your health care provider tells you otherwise, increase your fluid intake. This will help flush the contrast dye from your body.  After the procedure, unless your health care provider tells you otherwise, you may return to your normal schedule, including diet, activities, and medicines. This information is not intended to replace advice given to you by your health care provider. Make sure you discuss any questions you have with your health care provider. Document Revised: 04/23/2018 Document Reviewed: 04/23/2018 Elsevier Patient Education  [place].

## 2019-12-23 NOTE — Progress Notes (Signed)
Cardiology Office Note:    Date:  12/23/2019   ID:  Julie Marquez, DOB March 03, 1971, MRN [id number]  PCP:  Arnetha Courser, MD  Cardiologist:  No primary care provider on file.  Electrophysiologist:  None   Referring MD: Hubbard Hartshorn, FNP   Chief Complaint  Patient presents with  . New Patient (Initial Visit)    HTN/L sided chest discomfort/hyperlipidemia; Meds verbally reviewed with patient.   Julie Marquez is a 49 y.o. female who is being seen today for the evaluation of chest pain at the request of Hubbard Hartshorn, FNP.  History of Present Illness:    Julie Marquez is a 49 y.o. female with a hx of hypertension, hyperlipidemia, diabetes, current smoker 20 years who presents due to left-sided chest pain.  Patient states having symptoms of chest discomfort which she describes as pressure over the past 2 months.  Symptoms are not related with exertion and rates it as a 3 out of 10 in severity when they occur.  There is sometimes occur when she lays down, no exacerbating or relieving factors.  She also describes shortness of breath with exertion over the past 2 months.  Symptoms have worsened somewhat.  She is 20-year smoker.  Has a family history of MI in his father age 73s.  Past Medical History:  Diagnosis Date  . Anxiety   . Depression   . Diabetes mellitus without complication ([place])   . Diabetes mellitus, type II ([place])   . Fatigue   . Hypertension   . Hypothyroidism   . Panic attack   . Vitamin D deficiency     Past Surgical History:  Procedure Laterality Date  . THYROIDECTOMY  2005    Current Medications: Current Meds  Medication Sig  . albuterol (VENTOLIN HFA) 108 (90 Base) MCG/ACT inhaler Inhale 2 puffs into the lungs every 6 (six) hours as needed for wheezing or shortness of breath.  . cyclobenzaprine (FLEXERIL) 10 MG tablet Take 1 tablet (10 mg total) by mouth every 8 (eight) hours as needed for muscle spasms.  Marland Kitchen FLUoxetine (PROZAC) 40 MG capsule Take 1  capsule (40 mg total) by mouth daily.  . fluticasone (FLONASE) 50 MCG/ACT nasal spray Place 2 sprays into both nostrils daily as needed.  Marland Kitchen glucose blood (ACCU-CHEK GUIDE) test strip Check fingerstick blood sugars once a day if desired; LON 99 months; Dx E11.9  . levothyroxine (SYNTHROID) 25 MCG tablet TAKE 1 TABLET BY MOUTH DAILY  . losartan (COZAAR) 50 MG tablet TAKE 1 TABLET BY MOUTH DAILY FOR KIDNEY PROTECTION AND BLOOD PRESSURE  . metFORMIN (GLUCOPHAGE-XR) 500 MG 24 hr tablet Take 1 tablet (500 mg total) by mouth 2 (two) times daily.  Marland Kitchen PARAGARD INTRAUTERINE COPPER IU by Intrauterine route.  . ReliOn Ultra Thin Lancets MISC Check fingerstick blood sugars once a day if desired; LON 99 months, E11.9  . Vitamin D, Ergocalciferol, (DRISDOL) 1.25 MG (50000 UT) CAPS capsule Take 1 capsule (50,000 Units total) by mouth every 7 (seven) days.  . [DISCONTINUED] atorvastatin (LIPITOR) 20 MG tablet TAKE 1 TABLET BY MOUTH DAILY EVERY EVENING.     Allergies:   Lisinopril   Social History   Socioeconomic History  . Marital status: Married    Spouse name: robert  . Number of children: 1  . Years of education: Not on file  . Highest education level: Associate degree: occupational, Hotel manager, or vocational program  Occupational History  . Not on file  Tobacco Use  .  Smoking status: Current Every Day Smoker    Packs/day: 0.25    Years: 20.00    Pack years: 5.00    Types: Cigarettes  . Smokeless tobacco: Never Used  Substance and Sexual Activity  . Alcohol use: Not Currently    Comment: occas  . Drug use: No  . Sexual activity: Yes    Birth control/protection: I.U.D.    Comment: paragard  Other Topics Concern  . Not on file  Social History Narrative  . Not on file   Social Determinants of Health   Financial Resource Strain: Low Risk   . Difficulty of Paying Living Expenses: Not hard at all  Food Insecurity: No Food Insecurity  . Worried About Charity fundraiser in the Last Year:  Never true  . Ran Out of Food in the Last Year: Never true  Transportation Needs: No Transportation Needs  . Lack of Transportation (Medical): No  . Lack of Transportation (Non-Medical): No  Physical Activity: Sufficiently Active  . Days of Exercise per Week: 3 days  . Minutes of Exercise per Session: 60 min  Stress: No Stress Concern Present  . Feeling of Stress : Not at all  Social Connections: Unknown  . Frequency of Communication with Friends and Family: Not on file  . Frequency of Social Gatherings with Friends and Family: Not on file  . Attends Religious Services: More than 4 times per year  . Active Member of Clubs or Organizations: No  . Attends Archivist Meetings: Never  . Marital Status: Married     Family History: The patient's family history includes Alcohol abuse in her brother, brother, and father; Breast cancer in her paternal grandmother; Cancer in her maternal grandmother and paternal grandmother; Dementia in her mother; Heart disease in her father; Thyroid disease in her sister.  ROS:   Please see the history of present illness.     All other systems reviewed and are negative.  EKGs/Labs/Other Studies Reviewed:    The following studies were reviewed today:   EKG:  EKG is  ordered today.  The ekg ordered today demonstrates sinus rhythm, normal ECG.  Recent Labs: 12/10/2019: ALT 18; BUN 13; Creatinine, Ser 0.73; Hemoglobin 11.6; Platelets 261; Potassium 3.7; Sodium 137; TSH 0.791  Recent Lipid Panel    Component Value Date/Time   CHOL 167 12/10/2019 1515   CHOL 205 (H) 04/20/2016 1216   TRIG 187 (H) 12/10/2019 1515   HDL 43 12/10/2019 1515   HDL 47 04/20/2016 1216   CHOLHDL 3.9 12/10/2019 1515   VLDL 37 12/10/2019 1515   LDLCALC 87 12/10/2019 1515   LDLCALC 77 12/03/2018 1427    Physical Exam:    VS:  BP 130/90 (BP Location: Right Arm, Patient Position: Sitting, Cuff Size: Normal)   Pulse 90   Ht 5' 3.5" (1.613 m)   Wt 203 lb (92.1 kg)    SpO2 97%   BMI 35.40 kg/m     Wt Readings from Last 3 Encounters:  12/23/19 203 lb (92.1 kg)  04/18/19 195 lb (88.5 kg)  01/11/19 197 lb 3.2 oz (89.4 kg)     GEN:  Well nourished, well developed in no acute distress HEENT: Normal NECK: No JVD; No carotid bruits LYMPHATICS: No lymphadenopathy CARDIAC: RRR, no murmurs, rubs, gallops RESPIRATORY:  Clear to auscultation without rales, wheezing or rhonchi  ABDOMEN: Soft, non-tender, non-distended MUSCULOSKELETAL:  No edema; No deformity  SKIN: Warm and dry NEUROLOGIC:  Alert and oriented x 3 PSYCHIATRIC:  Normal affect   ASSESSMENT:    1. Chest pain of uncertain etiology   2. Dyspnea on exertion   3. Smoking   4. Pure hypercholesterolemia   5. Precordial pain    PLAN:    In order of problems listed above:  1. Patient has atypical chest pain.  She has risk factors of hypertension, diabetes, hyperlipidemia, smoking.  We will evaluate patient with echocardiogram and Lexiscan myocardial perfusion imaging stress test for presence of CAD 2. Dyspnea on exertion  could be an anginal equivalent.  Also lung disease such as COPD is possible due to long history of smoking.  Echo and stress test as above. 3. Smoking cessation advised, over 5 minutes spent counseling patient. 4. Patient has hyperlipidemia, ASCVD risk score is 2.9.  Moderate intensity statin is recommended due to her history of diabetes.  Increase Lipitor to 40 mg daily.  Follow-up after echocardiogram and stress testing.   Medication Adjustments/Labs and Tests Ordered: Current medicines are reviewed at length with the patient today.  Concerns regarding medicines are outlined above.  Orders Placed This Encounter  Procedures  . NM Myocar Multi W/Spect W/Wall Motion / EF  . EKG 12-Lead  . ECHOCARDIOGRAM COMPLETE   Meds ordered this encounter  Medications  . atorvastatin (LIPITOR) 40 MG tablet    Sig: Take 1 tablet (40 mg total) by mouth daily.    Dispense:  90  tablet    Refill:  3    Patient Instructions  Medication Instructions:  - Your physician has recommended you make the following change in your medication:   1) Increase lipitor (atorvastatin) to 40 mg- take 1 tablet (40 mg) by mouth once a day  *If you need a refill on your cardiac medications before your next appointment, please call your pharmacy*  Lab Work: - none ordered  If you have labs (blood work) drawn today and your tests are completely normal, you will receive your results only by: Marland Kitchen MyChart Message (if you have MyChart) OR . A paper copy in the mail If you have any lab test that is abnormal or we need to change your treatment, we will call you to review the results.  Testing/Procedures: - Your physician has requested that you have an echocardiogram. Echocardiography is a painless test that uses sound waves to create images of your heart. It provides your doctor with information about the size and shape of your heart and how well your heart's chambers and valves are working. This procedure takes approximately one hour. There are no restrictions for this procedure.  - Your physician has requested that you have a lexiscan myoview.  [place]  Your caregiver has ordered a Stress Test with nuclear imaging. The purpose of this test is to evaluate the blood supply to your heart muscle. This procedure is referred to as a "Non-Invasive Stress Test." This is because other than having an IV started in your vein, nothing is inserted or "invades" your body. Cardiac stress tests are done to find areas of poor blood flow to the heart by determining the extent of coronary artery disease (CAD). Some patients exercise on a treadmill, which naturally increases the blood flow to your heart, while others who are  unable to walk on a treadmill due to physical limitations have a pharmacologic/chemical stress agent called Lexiscan . This medicine will mimic walking on a treadmill by temporarily  increasing your coronary blood flow.   Please note: these test may take anywhere between 2-4 hours  to complete  PLEASE REPORT TO [facility] MEDICAL MALL ENTRANCE  THE VOLUNTEERS AT THE FIRST DESK WILL DIRECT YOU WHERE TO GO  Date of Procedure:_____________________________________  Arrival Time for Procedure:______________________________  Instructions regarding medication:   __x__ : Hold diabetes medication morning of procedure (oral & insulin)  _x___:  You may take all of your other regular morning medications the day of your test as normal with enough water to get them down safely.  PLEASE NOTIFY THE OFFICE AT LEAST 1 HOURS IN ADVANCE IF YOU ARE UNABLE TO KEEP YOUR APPOINTMENT.  [phone] AND  PLEASE NOTIFY NUCLEAR MEDICINE AT [facility] AT LEAST 24 HOURS IN ADVANCE IF YOU ARE UNABLE TO KEEP YOUR APPOINTMENT. [phone]  How to prepare for your Myoview test:  1. Do not eat or drink after midnight 2. No caffeine for 24 hours prior to test 3. No smoking 24 hours prior to test. 4. Your medication may be taken with water.  If your doctor stopped a medication because of this test, do not take that medication. 5. Ladies, please do not wear dresses.  Skirts or pants are appropriate. Please wear a short sleeve shirt. 6. No perfume, cologne or lotion. 7. Wear comfortable walking shoes. No heels!   Follow-Up: At [facility], you and your health needs are our priority.  As part of our continuing mission to provide you with exceptional heart care, we have created designated Provider Care Teams.  These Care Teams include your primary Cardiologist (physician) and Advanced Practice Providers (APPs -  Physician Assistants and Nurse Practitioners) who all work together to provide you with the care you need, when you need it.  Your next appointment:   5-6 week(s)/ after cardiac testing is completed  The format for your next appointment:   In Person  Provider:   Kate Sable, MD  Other  Instructions N/a   Echocardiogram An echocardiogram is a procedure that uses painless sound waves (ultrasound) to produce an image of the heart. Images from an echocardiogram can provide important information about:  Signs of coronary artery disease (CAD).  Aneurysm detection. An aneurysm is a weak or damaged part of an artery wall that bulges out from the normal force of blood pumping through the body.  Heart size and shape. Changes in the size or shape of the heart can be associated with certain conditions, including heart failure, aneurysm, and CAD.  Heart muscle function.  Heart valve function.  Signs of a past heart attack.  Fluid buildup around the heart.  Thickening of the heart muscle.  A tumor or infectious growth around the heart valves. Tell a health care provider about:  Any allergies you have.  All medicines you are taking, including vitamins, herbs, eye drops, creams, and over-the-counter medicines.  Any blood disorders you have.  Any surgeries you have had.  Any medical conditions you have.  Whether you are pregnant or may be pregnant. What are the risks? Generally, this is a safe procedure. However, problems may occur, including:  Allergic reaction to dye (contrast) that may be used during the procedure. What happens before the procedure? No specific preparation is needed. You may eat and drink normally. What happens during the procedure?   An IV tube may be inserted into one of your veins.  You may receive contrast through this tube. A contrast is an injection that improves the quality of the pictures from your heart.  A gel will be applied to your chest.  A wand-like tool (transducer) will  be moved over your chest. The gel will help to transmit the sound waves from the transducer.  The sound waves will harmlessly bounce off of your heart to allow the heart images to be captured in real-time motion. The images will be recorded on a computer. The  procedure may vary among health care providers and hospitals. What happens after the procedure?  You may return to your normal, everyday life, including diet, activities, and medicines, unless your health care provider tells you not to do that. Summary  An echocardiogram is a procedure that uses painless sound waves (ultrasound) to produce an image of the heart.  Images from an echocardiogram can provide important information about the size and shape of your heart, heart muscle function, heart valve function, and fluid buildup around your heart.  You do not need to do anything to prepare before this procedure. You may eat and drink normally.  After the echocardiogram is completed, you may return to your normal, everyday life, unless your health care provider tells you not to do that. This information is not intended to replace advice given to you by your health care provider. Make sure you discuss any questions you have with your health care provider. Document Revised: 02/28/2019 Document Reviewed: 12/10/2016 Elsevier Patient Education  2020 [place].    Cardiac Nuclear Scan A cardiac nuclear scan is a test that measures blood flow to the heart when a person is resting and when he or she is exercising. The test looks for problems such as:  Not enough blood reaching a portion of the heart.  The heart muscle not working normally. You may need this test if:  You have heart disease.  You have had abnormal lab results.  You have had heart surgery or a balloon procedure to open up blocked arteries (angioplasty).  You have chest pain.  You have shortness of breath. In this test, a radioactive dye (tracer) is injected into your bloodstream. After the tracer has traveled to your heart, an imaging device is used to measure how much of the tracer is absorbed by or distributed to various areas of your heart. This procedure is usually done at a hospital and takes 2-4 hours. Tell a health  care provider about:  Any allergies you have.  All medicines you are taking, including vitamins, herbs, eye drops, creams, and over-the-counter medicines.  Any problems you or family members have had with anesthetic medicines.  Any blood disorders you have.  Any surgeries you have had.  Any medical conditions you have.  Whether you are pregnant or may be pregnant. What are the risks? Generally, this is a safe procedure. However, problems may occur, including:  Serious chest pain and heart attack. This is only a risk if the stress portion of the test is done.  Rapid heartbeat.  Sensation of warmth in your chest. This usually passes quickly.  Allergic reaction to the tracer. What happens before the procedure?  Ask your health care provider about changing or stopping your regular medicines. This is especially important if you are taking diabetes medicines or blood thinners.  Follow instructions from your health care provider about eating or drinking restrictions.  Remove your jewelry on the day of the procedure. What happens during the procedure?  An IV will be inserted into one of your veins.  Your health care provider will inject a small amount of radioactive tracer through the IV.  You will wait for 20-40 minutes while the tracer travels through your  bloodstream.  Your heart activity will be monitored with an electrocardiogram (ECG).  You will lie down on an exam table.  Images of your heart will be taken for about 15-20 minutes.  You may also have a stress test. For this test, one of the following may be done: ? You will exercise on a treadmill or stationary bike. While you exercise, your heart's activity will be monitored with an ECG, and your blood pressure will be checked. ? You will be given medicines that will increase blood flow to parts of your heart. This is done if you are unable to exercise.  When blood flow to your heart has peaked, a tracer will again be  injected through the IV.  After 20-40 minutes, you will get back on the exam table and have more images taken of your heart.  Depending on the type of tracer used, scans may need to be repeated 3-4 hours later.  Your IV line will be removed when the procedure is over. The procedure may vary among health care providers and hospitals. What happens after the procedure?  Unless your health care provider tells you otherwise, you may return to your normal schedule, including diet, activities, and medicines.  Unless your health care provider tells you otherwise, you may increase your fluid intake. This will help to flush the contrast dye from your body. Drink enough fluid to keep your urine pale yellow.  Ask your health care provider, or the department that is doing the test: ? When will my results be ready? ? How will I get my results? Summary  A cardiac nuclear scan measures the blood flow to the heart when a person is resting and when he or she is exercising.  Tell your health care provider if you are pregnant.  Before the procedure, ask your health care provider about changing or stopping your regular medicines. This is especially important if you are taking diabetes medicines or blood thinners.  After the procedure, unless your health care provider tells you otherwise, increase your fluid intake. This will help flush the contrast dye from your body.  After the procedure, unless your health care provider tells you otherwise, you may return to your normal schedule, including diet, activities, and medicines. This information is not intended to replace advice given to you by your health care provider. Make sure you discuss any questions you have with your health care provider. Document Revised: 04/23/2018 Document Reviewed: 04/23/2018 Elsevier Patient Education  2020 [place], Kate Sable, MD  12/23/2019 12:23 PM    [place]

## 2019-12-23 NOTE — Telephone Encounter (Signed)
Patient Julie Marquez pending   Needs echo lexi and fu scheduled.

## 2020-01-02 ENCOUNTER — Encounter
Admission: RE | Admit: 2020-01-02 | Discharge: 2020-01-02 | Disposition: A | Discharge status: Home / Self Care | Payer: No Typology Code available for payment source | Source: Ambulatory Visit | Attending: Cardiology | Admitting: Cardiology

## 2020-01-02 ENCOUNTER — Other Ambulatory Visit: Payer: Self-pay

## 2020-01-02 DIAGNOSIS — R0609 Other forms of dyspnea: Secondary | ICD-10-CM

## 2020-01-02 DIAGNOSIS — R0600 Dyspnea, unspecified: Secondary | ICD-10-CM

## 2020-01-02 DIAGNOSIS — R072 Precordial pain: Secondary | ICD-10-CM

## 2020-01-02 LAB — NM MYOCAR MULTI W/SPECT W/WALL MOTION / EF
LV dias vol: 102 mL (ref 46–106)
LV sys vol: 44 mL
Peak HR: 110 {beats}/min
Percent HR: 63 %
Rest HR: 74 {beats}/min
SDS: 5
SRS: 21
SSS: 22
TID: 1.08

## 2020-01-02 MED ORDER — TECHNETIUM TC 99M TETROFOSMIN IV KIT
32.1000 | PACK | Freq: Once | INTRAVENOUS | Status: AC | PRN
  Administered 2020-01-02: 32.1 via INTRAVENOUS

## 2020-01-02 MED ORDER — REGADENOSON 0.4 MG/5ML IV SOLN
0.4000 mg | Freq: Once | INTRAVENOUS | Status: AC
  Administered 2020-01-02: 0.4 mg via INTRAVENOUS
  Filled 2020-01-02: qty 5

## 2020-01-02 MED ORDER — TECHNETIUM TC 99M TETROFOSMIN IV KIT
10.9400 | PACK | Freq: Once | INTRAVENOUS | Status: AC | PRN
  Administered 2020-01-02: 10.94 via INTRAVENOUS

## 2020-01-03 ENCOUNTER — Ambulatory Visit (INDEPENDENT_AMBULATORY_CARE_PROVIDER_SITE_OTHER): Payer: No Typology Code available for payment source

## 2020-01-03 DIAGNOSIS — R079 Chest pain, unspecified: Secondary | ICD-10-CM | POA: Diagnosis not present

## 2020-01-03 DIAGNOSIS — R0600 Dyspnea, unspecified: Secondary | ICD-10-CM | POA: Diagnosis not present

## 2020-01-03 DIAGNOSIS — R0609 Other forms of dyspnea: Secondary | ICD-10-CM

## 2020-01-14 ENCOUNTER — Other Ambulatory Visit: Payer: Self-pay | Admitting: Family Medicine

## 2020-01-14 NOTE — Telephone Encounter (Signed)
Requested medication (s) are due for refill today: yes  Requested medication (s) are on the active medication list:yes  Last refill:  12/03/18  Future visit scheduled: no  Notes to clinic:  RX expired 12/03/19    Requested Prescriptions  Pending Prescriptions Disp Refills   metFORMIN (GLUCOPHAGE-XR) 500 MG 24 hr tablet [Pharmacy Med Name: metFORMIN HCL ER 500 MG TB2 500 Tablet] 60 tablet 3    Sig: TAKE 1 TABLET BY MOUTH 2 TIMES DAILY.      Endocrinology:  Diabetes - Biguanides Passed - 01/14/2020  2:59 PM      Passed - Cr in normal range and within 360 days    Creat  Date Value Ref Range Status  01/11/2019 0.67 0.50 - 1.10 mg/dL Final   Creatinine, Ser  Date Value Ref Range Status  12/10/2019 0.73 0.44 - 1.00 mg/dL Final   Creatinine, Urine  Date Value Ref Range Status  12/03/2018 144 20 - 275 mg/dL Final          Passed - HBA1C is between 0 and 7.9 and within 180 days    Hgb A1c MFr Bld  Date Value Ref Range Status  12/10/2019 6.0 (H) 4.8 - 5.6 % Final    Comment:    (NOTE) Pre diabetes:          5.7%-6.4% Diabetes:              >6.4% Glycemic control for   <7.0% adults with diabetes           Passed - eGFR in normal range and within 360 days    GFR, Est African American  Date Value Ref Range Status  01/11/2019 121 > OR = 60 mL/min/1.42m Final   GFR calc Af Amer  Date Value Ref Range Status  12/10/2019 >60 >60 mL/min Final   GFR, Est Non African American  Date Value Ref Range Status  01/11/2019 105 > OR = 60 mL/min/1.754mFinal   GFR calc non Af Amer  Date Value Ref Range Status  12/10/2019 >60 >60 mL/min Final          Passed - Valid encounter within last 6 months    Recent Outpatient Visits           1 month ago Mixed hyperlipidemia   [facility]   8 months ago Suspected Covid-19 Virus Infection   [facility]   9 months ago Essential hypertension, benign   [facility]   1 year ago [facility]   1 year [facility]in[facility]       Future Appointments             In 1 week [place], Br[facility]aron EdelmanMD [facility]

## 2020-01-27 ENCOUNTER — Other Ambulatory Visit: Payer: Self-pay

## 2020-01-27 ENCOUNTER — Ambulatory Visit (INDEPENDENT_AMBULATORY_CARE_PROVIDER_SITE_OTHER): Payer: No Typology Code available for payment source | Admitting: Cardiology

## 2020-01-27 ENCOUNTER — Encounter: Payer: Self-pay | Admitting: Cardiology

## 2020-01-27 VITALS — BP 140/92 | HR 88 | Ht 63.5 in | Wt 200.8 lb

## 2020-01-27 DIAGNOSIS — I10 Essential (primary) hypertension: Secondary | ICD-10-CM

## 2020-01-27 DIAGNOSIS — R0609 Other forms of dyspnea: Secondary | ICD-10-CM

## 2020-01-27 DIAGNOSIS — F17200 Nicotine dependence, unspecified, uncomplicated: Secondary | ICD-10-CM | POA: Diagnosis not present

## 2020-01-27 DIAGNOSIS — R079 Chest pain, unspecified: Secondary | ICD-10-CM

## 2020-01-27 DIAGNOSIS — R0600 Dyspnea, unspecified: Secondary | ICD-10-CM

## 2020-01-27 NOTE — Progress Notes (Signed)
Cardiology Office Note:    Date:  01/27/2020   ID:  Julie Marquez, DOB 04-09-1971, MRN [id number]  PCP:  Hubbard Hartshorn, FNP  Cardiologist:  No primary care provider on file.  Electrophysiologist:  None   Referring MD: Arnetha Courser, MD   Chief Complaint  Patient presents with  . other    5-6 week and discuss Echo and Myoview. Meds reviewed by the pt. verbally. "doing well."      History of Present Illness:    Julie Marquez is a 49 y.o. female with a hx of hypertension, hyperlipidemia, diabetes, current smoker 20 years who presents for follow-up. She was last seen due to 76-month history left-sided chest pain and dyspnea on exertion. Due to risk factors, echocardiogram and pharmacologic stress test were ordered. Patient now presents for results.  She states doing okay, other than recently stressed because she lost her mother-in-law 2 days ago.  Distress is why her blood pressure is slightly elevated today.   Past Medical History:  Diagnosis Date  . Anxiety   . Depression   . Diabetes mellitus without complication ([place])   . Diabetes mellitus, type II ([place])   . Fatigue   . Hypertension   . Hypothyroidism   . Panic attack   . Vitamin D deficiency     Past Surgical History:  Procedure Laterality Date  . THYROIDECTOMY  2005    Current Medications: Current Meds  Medication Sig  . albuterol (VENTOLIN HFA) 108 (90 Base) MCG/ACT inhaler Inhale 2 puffs into the lungs every 6 (six) hours as needed for wheezing or shortness of breath.  Marland Kitchen atorvastatin (LIPITOR) 40 MG tablet Take 1 tablet (40 mg total) by mouth daily.  . cyclobenzaprine (FLEXERIL) 10 MG tablet Take 1 tablet (10 mg total) by mouth every 8 (eight) hours as needed for muscle spasms.  Marland Kitchen FLUoxetine (PROZAC) 40 MG capsule Take 1 capsule (40 mg total) by mouth daily.  . fluticasone (FLONASE) 50 MCG/ACT nasal spray Place 2 sprays into both nostrils daily as needed.  Marland Kitchen glucose blood (ACCU-CHEK GUIDE) test strip  Check fingerstick blood sugars once a day if desired; LON 99 months; Dx E11.9  . levothyroxine (SYNTHROID) 25 MCG tablet TAKE 1 TABLET BY MOUTH DAILY  . losartan (COZAAR) 50 MG tablet TAKE 1 TABLET BY MOUTH DAILY FOR KIDNEY PROTECTION AND BLOOD PRESSURE  . metFORMIN (GLUCOPHAGE-XR) 500 MG 24 hr tablet TAKE 1 TABLET BY MOUTH 2 TIMES DAILY.  Marland Kitchen PARAGARD INTRAUTERINE COPPER IU by Intrauterine route.  . ReliOn Ultra Thin Lancets MISC Check fingerstick blood sugars once a day if desired; LON 99 months, E11.9     Allergies:   Lisinopril   Social History   Socioeconomic History  . Marital status: Married    Spouse name: robert  . Number of children: 1  . Years of education: Not on file  . Highest education level: Associate degree: occupational, Hotel manager, or vocational program  Occupational History  . Not on file  Tobacco Use  . Smoking status: Current Every Day Smoker    Packs/day: 0.25    Years: 20.00    Pack years: 5.00    Types: Cigarettes  . Smokeless tobacco: Never Used  Substance and Sexual Activity  . Alcohol use: Not Currently    Comment: occas  . Drug use: No  . Sexual activity: Yes    Birth control/protection: I.U.D.    Comment: paragard  Other Topics Concern  . Not on file  Social  History Narrative  . Not on file   Social Determinants of Health   Financial Resource Strain:   . Difficulty of Paying Living Expenses: Not on file  Food Insecurity:   . Worried About Charity fundraiser in the Last Year: Not on file  . Ran Out of Food in the Last Year: Not on file  Transportation Needs:   . Lack of Transportation (Medical): Not on file  . Lack of Transportation (Non-Medical): Not on file  Physical Activity:   . Days of Exercise per Week: Not on file  . Minutes of Exercise per Session: Not on file  Stress:   . Feeling of Stress : Not on file  Social Connections:   . Frequency of Communication with Friends and Family: Not on file  . Frequency of Social Gatherings  with Friends and Family: Not on file  . Attends Religious Services: Not on file  . Active Member of Clubs or Organizations: Not on file  . Attends Archivist Meetings: Not on file  . Marital Status: Not on file     Family History: The patient's family history includes Alcohol abuse in her brother, brother, and father; Breast cancer in her paternal grandmother; Cancer in her maternal grandmother and paternal grandmother; Dementia in her mother; Heart disease in her father; Thyroid disease in her sister.  ROS:   Please see the history of present illness.     All other systems reviewed and are negative.  EKGs/Labs/Other Studies Reviewed:    The following studies were reviewed today:   EKG:  EKG is  ordered today.  The ekg ordered today demonstrates sinus rhythm, cannot rule out septal infarct.  Recent Labs: 12/10/2019: ALT 18; BUN 13; Creatinine, Ser 0.73; Hemoglobin 11.6; Platelets 261; Potassium 3.7; Sodium 137; TSH 0.791  Recent Lipid Panel    Component Value Date/Time   CHOL 167 12/10/2019 1515   CHOL 205 (H) 04/20/2016 1216   TRIG 187 (H) 12/10/2019 1515   HDL 43 12/10/2019 1515   HDL 47 04/20/2016 1216   CHOLHDL 3.9 12/10/2019 1515   VLDL 37 12/10/2019 1515   LDLCALC 87 12/10/2019 1515   LDLCALC 77 12/03/2018 1427    Physical Exam:    VS:  BP (!) 140/92 (BP Location: Left Arm, Patient Position: Sitting, Cuff Size: Normal)   Pulse 88   Ht 5' 3.5" (1.613 m)   Wt 200 lb 12 oz (91.1 kg)   SpO2 98%   BMI 35.00 kg/m     Wt Readings from Last 3 Encounters:  01/27/20 200 lb 12 oz (91.1 kg)  12/23/19 203 lb (92.1 kg)  04/18/19 195 lb (88.5 kg)     GEN:  Well nourished, well developed in no acute distress HEENT: Normal NECK: No JVD; No carotid bruits LYMPHATICS: No lymphadenopathy CARDIAC: RRR, no murmurs, rubs, gallops RESPIRATORY:  Clear to auscultation without rales, wheezing or rhonchi  ABDOMEN: Soft, non-tender, non-distended MUSCULOSKELETAL:  No  edema; No deformity  SKIN: Warm and dry NEUROLOGIC:  Alert and oriented x 3 PSYCHIATRIC:  Normal affect   ASSESSMENT:    1. Chest pain of uncertain etiology   2. Dyspnea on exertion   3. Smoking   4. Essential hypertension    PLAN:    In order of problems listed above:  1. Patient with atypical chest pain. Echocardiogram showed normal systolic function with grade 2 diastolic dysfunction. Pharmacologic stress test with no ischemia. 2. Dyspnea on exertion. Echocardiogram with grade 2 diastolic dysfunction.  Stress test was normal as above. She is euvolemic. Grade 2 diastolic dysfunction likely secondary to history of hypertension. Continue current BP meds. Due to long smoking history, will recommend pulmonary medicine eval for possible emphysema/COPD. 3. Smoking cessation advised, over 5 minutes spent counseling patient.  Follow-up in 6 months   Medication Adjustments/Labs and Tests Ordered: Current medicines are reviewed at length with the patient today.  Concerns regarding medicines are outlined above.  Orders Placed This Encounter  Procedures  . Ambulatory referral to Pulmonology  . EKG 12-Lead   No orders of the defined types were placed in this encounter.   Patient Instructions  Medication Instructions:  Your physician recommends that you continue on your current medications as directed. Please refer to the Current Medication list given to you today.  *If you need a refill on your cardiac medications before your next appointment, please call your pharmacy*   Lab Work: None If you have labs (blood work) drawn today and your tests are completely normal, you will receive your results only by: Marland Kitchen MyChart Message (if you have MyChart) OR . A paper copy in the mail If you have any lab test that is abnormal or we need to change your treatment, we will call you to review the results.   Testing/Procedures: None   Follow-Up: At [facility], you and your health needs are  our priority.  As part of our continuing mission to provide you with exceptional heart care, we have created designated Provider Care Teams.  These Care Teams include your primary Cardiologist (physician) and Advanced Practice Providers (APPs -  Physician Assistants and Nurse Practitioners) who all work together to provide you with the care you need, when you need it.  We recommend signing up for the patient portal called "MyChart".  Sign up information is provided on this After Visit Summary.  MyChart is used to connect with patients for Virtual Visits (Telemedicine).  Patients are able to view lab/test results, encounter notes, upcoming appointments, etc.  Non-urgent messages can be sent to your provider as well.   To learn more about what you can do with MyChart, go to [email or web address].    Your next appointment:   6 month(s)   We have put in a referral for you to see Pulmonology here in [place]. They should call you directly to schedule this appointment.   The format for your next appointment:   In Person  Provider:    You may see Dr Garen Lah or one of the following Advanced Practice Providers on your designated Care Team:    Murray Hodgkins, NP  Christell Faith, PA-C  Marrianne Mood, PA-C         Signed, Kate Sable, MD  01/27/2020 3:02 PM    [place]

## 2020-01-27 NOTE — Patient Instructions (Signed)
Medication Instructions:  Your physician recommends that you continue on your current medications as directed. Please refer to the Current Medication list given to you today.  *If you need a refill on your cardiac medications before your next appointment, please call your pharmacy*   Lab Work: None If you have labs (blood work) drawn today and your tests are completely normal, you will receive your results only by: Marland Kitchen MyChart Message (if you have MyChart) OR . A paper copy in the mail If you have any lab test that is abnormal or we need to change your treatment, we will call you to review the results.   Testing/Procedures: None   Follow-Up: At [facility], you and your health needs are our priority.  As part of our continuing mission to provide you with exceptional heart care, we have created designated Provider Care Teams.  These Care Teams include your primary Cardiologist (physician) and Advanced Practice Providers (APPs -  Physician Assistants and Nurse Practitioners) who all work together to provide you with the care you need, when you need it.  We recommend signing up for the patient portal called "MyChart".  Sign up information is provided on this After Visit Summary.  MyChart is used to connect with patients for Virtual Visits (Telemedicine).  Patients are able to view lab/test results, encounter notes, upcoming appointments, etc.  Non-urgent messages can be sent to your provider as well.   To learn more about what you can do with MyChart, go to [email or web address].    Your next appointment:   6 month(s)   We have put in a referral for you to see Pulmonology here in [place]. They should call you directly to schedule this appointment.   The format for your next appointment:   In Person  Provider:    You may see Dr Garen Lah or one of the following Advanced Practice Providers on your designated Care Team:    Murray Hodgkins, NP  Christell Faith, PA-C  Marrianne Mood, [place]

## 2020-05-27 ENCOUNTER — Other Ambulatory Visit (HOSPITAL_COMMUNITY)
Admission: RE | Admit: 2020-05-27 | Discharge: 2020-05-27 | Disposition: A | Discharge status: Home / Self Care | Payer: No Typology Code available for payment source | Source: Ambulatory Visit | Attending: Family Medicine | Admitting: Family Medicine

## 2020-05-27 ENCOUNTER — Other Ambulatory Visit: Payer: Self-pay | Admitting: Family Medicine

## 2020-05-27 ENCOUNTER — Ambulatory Visit (INDEPENDENT_AMBULATORY_CARE_PROVIDER_SITE_OTHER): Payer: No Typology Code available for payment source | Admitting: Family Medicine

## 2020-05-27 ENCOUNTER — Encounter: Payer: Self-pay | Admitting: Family Medicine

## 2020-05-27 ENCOUNTER — Other Ambulatory Visit: Payer: Self-pay

## 2020-05-27 VITALS — BP 124/82 | HR 94 | Temp 97.5°F | Resp 16 | Ht 64.0 in | Wt 195.5 lb

## 2020-05-27 DIAGNOSIS — E66811 Obesity, class 1: Secondary | ICD-10-CM

## 2020-05-27 DIAGNOSIS — E669 Obesity, unspecified: Secondary | ICD-10-CM

## 2020-05-27 DIAGNOSIS — E038 Other specified hypothyroidism: Secondary | ICD-10-CM

## 2020-05-27 DIAGNOSIS — E559 Vitamin D deficiency, unspecified: Secondary | ICD-10-CM

## 2020-05-27 DIAGNOSIS — R809 Proteinuria, unspecified: Secondary | ICD-10-CM

## 2020-05-27 DIAGNOSIS — J4520 Mild intermittent asthma, uncomplicated: Secondary | ICD-10-CM

## 2020-05-27 DIAGNOSIS — Z0000 Encounter for general adult medical examination without abnormal findings: Secondary | ICD-10-CM

## 2020-05-27 DIAGNOSIS — E1129 Type 2 diabetes mellitus with other diabetic kidney complication: Secondary | ICD-10-CM

## 2020-05-27 DIAGNOSIS — N761 Subacute and chronic vaginitis: Secondary | ICD-10-CM | POA: Insufficient documentation

## 2020-05-27 DIAGNOSIS — Z1231 Encounter for screening mammogram for malignant neoplasm of breast: Secondary | ICD-10-CM

## 2020-05-27 DIAGNOSIS — I10 Essential (primary) hypertension: Secondary | ICD-10-CM | POA: Diagnosis not present

## 2020-05-27 DIAGNOSIS — F4010 Social phobia, unspecified: Secondary | ICD-10-CM

## 2020-05-27 DIAGNOSIS — E782 Mixed hyperlipidemia: Secondary | ICD-10-CM

## 2020-05-27 DIAGNOSIS — Z5181 Encounter for therapeutic drug level monitoring: Secondary | ICD-10-CM

## 2020-05-27 DIAGNOSIS — Z1159 Encounter for screening for other viral diseases: Secondary | ICD-10-CM

## 2020-05-27 DIAGNOSIS — K760 Fatty (change of) liver, not elsewhere classified: Secondary | ICD-10-CM

## 2020-05-27 DIAGNOSIS — F419 Anxiety disorder, unspecified: Secondary | ICD-10-CM

## 2020-05-27 DIAGNOSIS — J309 Allergic rhinitis, unspecified: Secondary | ICD-10-CM

## 2020-05-27 MED ORDER — LEVOTHYROXINE SODIUM 25 MCG PO TABS: 25.0000 ug | ORAL_TABLET | Freq: Every day | ORAL | 3 refills | Status: DC

## 2020-05-27 MED ORDER — METFORMIN HCL ER 500 MG PO TB24: 500.0000 mg | ORAL_TABLET | Freq: Two times a day (BID) | ORAL | 3 refills | Status: DC

## 2020-05-27 MED ORDER — LOSARTAN POTASSIUM 50 MG PO TABS: 50.0000 mg | ORAL_TABLET | Freq: Every day | ORAL | 3 refills | Status: DC

## 2020-05-27 MED ORDER — FLUTICASONE PROPIONATE 50 MCG/ACT NA SUSP: 2.0000 | Freq: Every day | NASAL | 3 refills | Status: DC | PRN

## 2020-05-27 MED ORDER — FLUOXETINE HCL 40 MG PO CAPS: 40.0000 mg | ORAL_CAPSULE | Freq: Every day | ORAL | 3 refills | Status: DC

## 2020-05-27 MED ORDER — ATORVASTATIN CALCIUM 40 MG PO TABS: 40.0000 mg | ORAL_TABLET | Freq: Every day | ORAL | 3 refills | Status: DC

## 2020-05-27 NOTE — Patient Instructions (Signed)
Try and get appt with [facility] and see if that is helpful for your anxiety   I recommend looking on the centivo app or in-network lists for therapists or psychiatry offices - and let me know if you need a referral.  Most offices require the patient call and arrange and they will not let [place] make appointments for patients.   [facility],  [place]  [address] Get Driving Directions Main: [phone]   Preventive Care 65-64 Years Old, Female Preventive care refers to visits with your health care provider and lifestyle choices that can promote health and wellness. This includes:  A yearly physical exam. This may also be called an annual well check.  Regular dental visits and eye exams.  Immunizations.  Screening for certain conditions.  Healthy lifestyle choices, such as eating a healthy diet, getting regular exercise, not using drugs or products that contain nicotine and tobacco, and limiting alcohol use. What can I expect for my preventive care visit? Physical exam Your health care provider will check your:  Height and weight. This may be used to calculate body mass index (BMI), which tells if you are at a healthy weight.  Heart rate and blood pressure.  Skin for abnormal spots. Counseling Your health care provider may ask you questions about your:  Alcohol, tobacco, and drug use.  Emotional well-being.  Home and relationship well-being.  Sexual activity.  Eating habits.  Work and work Statistician.  Method of birth control.  Menstrual cycle.  Pregnancy history. What immunizations do I need?  Influenza (flu) vaccine  This is recommended every year. Tetanus, diphtheria, and pertussis (Tdap) vaccine  You may need a Td booster every 10 years. Varicella (chickenpox) vaccine  You may need this if you have not been vaccinated. Zoster (shingles) vaccine  You may need this after age 8. Measles, mumps, and  rubella (MMR) vaccine  You may need at least one dose of MMR if you were born in 1957 or later. You may also need a second dose. Pneumococcal conjugate (PCV13) vaccine  You may need this if you have certain conditions and were not previously vaccinated. Pneumococcal polysaccharide (PPSV23) vaccine  You may need one or two doses if you smoke cigarettes or if you have certain conditions. Meningococcal conjugate (MenACWY) vaccine  You may need this if you have certain conditions. Hepatitis A vaccine  You may need this if you have certain conditions or if you travel or work in places where you may be exposed to hepatitis A. Hepatitis B vaccine  You may need this if you have certain conditions or if you travel or work in places where you may be exposed to hepatitis B. Haemophilus influenzae type b (Hib) vaccine  You may need this if you have certain conditions. Human papillomavirus (HPV) vaccine  If recommended by your health care provider, you may need three doses over 6 months. You may receive vaccines as individual doses or as more than one vaccine together in one shot (combination vaccines). Talk with your health care provider about the risks and benefits of combination vaccines. What tests do I need? Blood tests  Lipid and cholesterol levels. These may be checked every 5 years, or more frequently if you are over 77 years old.  Hepatitis C test.  Hepatitis B test. Screening  Lung cancer screening. You may have this screening every year starting at age 57 if you have a 30-pack-year history of smoking and currently  smoke or have quit within the past 15 years.  Colorectal cancer screening. All adults should have this screening starting at age 61 and continuing until age 46. Your health care provider may recommend screening at age 60 if you are at increased risk. You will have tests every 1-10 years, depending on your results and the type of screening test.  Diabetes screening. This  is done by checking your blood sugar (glucose) after you have not eaten for a while (fasting). You may have this done every 1-3 years.  Mammogram. This may be done every 1-2 years. Talk with your health care provider about when you should start having regular mammograms. This may depend on whether you have a family history of breast cancer.  BRCA-related cancer screening. This may be done if you have a family history of breast, ovarian, tubal, or peritoneal cancers.  Pelvic exam and Pap test. This may be done every 3 years starting at age 31. Starting at age 54, this may be done every 5 years if you have a Pap test in combination with an HPV test. Other tests  Sexually transmitted disease (STD) testing.  Bone density scan. This is done to screen for osteoporosis. You may have this scan if you are at high risk for osteoporosis. Follow these instructions at home: Eating and drinking  Eat a diet that includes fresh fruits and vegetables, whole grains, lean protein, and low-fat dairy.  Take vitamin and mineral supplements as recommended by your health care provider.  Do not drink alcohol if: ? Your health care provider tells you not to drink. ? You are pregnant, may be pregnant, or are planning to become pregnant.  If you drink alcohol: ? Limit how much you have to 0-1 drink a day. ? Be aware of how much alcohol is in your drink. In the [place] one drink equals one 12 oz bottle of beer (355 mL), one 5 oz glass of wine (148 mL), or one 1 oz glass of hard liquor (44 mL). Lifestyle  Take daily care of your teeth and gums.  Stay active. Exercise for at least 30 minutes on 5 or more days each week.  Do not use any products that contain nicotine or tobacco, such as cigarettes, e-cigarettes, and chewing tobacco. If you need help quitting, ask your health care provider.  If you are sexually active, practice safe sex. Use a condom or other form of birth control (contraception) in order to prevent  pregnancy and STIs (sexually transmitted infections).  If told by your health care provider, take low-dose aspirin daily starting at age 25. What's next?  Visit your health care provider once a year for a well check visit.  Ask your health care provider how often you should have your eyes and teeth checked.  Stay up to date on all vaccines. This information is not intended to replace advice given to you by your health care provider. Make sure you discuss any questions you have with your health care provider. Document Revised: 07/19/2018 Document Reviewed: 07/19/2018 Elsevier Patient Education  2020 [employer].

## 2020-05-27 NOTE — Progress Notes (Signed)
Patient: Julie Marquez, Female    DOB: 01-22-1971, 49 y.o.   MRN: [id number] Delsa Grana, PA-C Visit Date: 05/27/2020  Today's Provider: Delsa Grana, PA-C   Chief Complaint  Patient presents with  . Annual Exam   Subjective:   Annual physical exam:  Julie Marquez is a 49 y.o. female who presents today for complete physical exam:  Exercise/Activity: She exercises several times a week Diet/nutrition:   Sleep: Sleeps well gets about 8 to 10 hours of sleep at night  Anxiety- long hx around people, hx of anxiety as a child Moods okay Attention is off She went to a therapist or counselor before but it was a very bad experience for her she was told that she had dementia and she did not feel that she can continue.  Patient becomes very tearful today.  She gave a negative screening on PHQ today but and discussed with her patient is tearful crying states that she is doing okay at work despite anxiety and some inattentiveness but she is having more trouble with her relationships and friendships, she often feels left out feels lonely.  She takes Prozac 40 mg daily, she has never been on any additional medications, she has not reached out to the [facility], she is open to therapy or psychiatry.  She feels that she manages fairly well in her life but she gets anxious when coming to the doctor and becomes very emotional when she is asked questions about her moods/anxiety/depression GAD 7 : Generalized Anxiety Score 05/27/2020  Nervous, Anxious, on Edge 0  Control/stop worrying 0  Worry too much - different things 0  Trouble relaxing 0  Restless 0  Easily annoyed or irritable 0  Afraid - awful might happen 0  Total GAD 7 Score 0  Anxiety Difficulty Not difficult at all   Depression screen [facility] 2/9 05/27/2020 11/29/2019 05/08/2019  Decreased Interest 0 0 1  Down, Depressed, Hopeless 0 0 0  PHQ - 2 Score 0 0 1  Altered sleeping 0 0 0  Tired, decreased energy 0 3 0  Change in appetite 0 0 0    Feeling bad or failure about yourself  0 0 0  Trouble concentrating 0 0 0  Moving slowly or fidgety/restless 0 0 0  Suicidal thoughts 0 0 0  PHQ-9 Score 0 3 1  Difficult doing work/chores Not difficult at all Not difficult at all Not difficult at all   History of intermittent asthma?  Hot humid weather causes her asthma to be worse using albuterol rescue inhaler 2-3 x a week No nighttime waking No recent exacerbation requiring steroids   Pt wished to discuss acute complaints and do routine f/up on chronic conditions today in addition to CPE. Advised pt of separate visit billing/coding  USPSTF grade A and B recommendations - reviewed and addressed today  Depression:  Phq 9 completed today by patient, was reviewed by me with patient in the room PHQ score is neg, pt feels sad -tearful PHQ 2/9 Scores 05/27/2020 11/29/2019 05/08/2019 04/18/2019  PHQ - 2 Score 0 0 1 0  PHQ- 9 Score 0 3 1 0   Depression screen [facility] 2/9 05/27/2020 11/29/2019 05/08/2019 04/18/2019 01/11/2019  Decreased Interest 0 0 1 0 0  Down, Depressed, Hopeless 0 0 0 0 3  PHQ - 2 Score 0 0 1 0 3  Altered sleeping 0 0 0 0 0  Tired, decreased energy 0 3 0 0 3  Change in appetite 0  0 0 0 3  Feeling bad or failure about yourself  0 0 0 0 0  Trouble concentrating 0 0 0 0 3  Moving slowly or fidgety/restless 0 0 0 0 0  Suicidal thoughts 0 0 0 0 0  PHQ-9 Score 0 3 1 0 12  Difficult doing work/chores Not difficult at all Not difficult at all Not difficult at all Not difficult at all Not difficult at all    Alcohol screening:   Office Visit from 05/27/2020 in [facility]  AUDIT-C Score 1      Immunizations and Health Maintenance: Health Maintenance  Topic Date Due  . Hepatitis C Screening  Never done  . TETANUS/TDAP  Never done  . FOOT EXAM  12/04/2019  . MAMMOGRAM  01/12/2020  . COVID-19 Vaccine (1) 06/12/2020 (Originally 02/01/1983)  . HIV Screening  11/28/2020 (Originally 01/31/1986)  . HEMOGLOBIN A1C   06/08/2020  . INFLUENZA VACCINE  06/21/2020  . OPHTHALMOLOGY EXAM  08/06/2020  . PAP SMEAR-Modifier  01/11/2022  . PNEUMOCOCCAL POLYSACCHARIDE VACCINE AGE 33-64 HIGH RISK  Completed     Hep C Screening:  Will do today  STD testing and prevention (HIV/chl/gon/syphilis):  see above, no additional testing desired by pt today  Intimate partner violence:  Denies any abuse at home reports that she is safe  Sexual History/Pain during Intercourse:  Married - no concerns with intercourse  Menstrual History/LMP/Abnormal Bleeding:   IUD - paraguard heavy menses- saw Melody at [facility]  Patient's last menstrual period was 05/21/2020.  Incontinence Symptoms: none, some vaginal itchiness -asks if she could be tested today not having any discharge, urinary symptoms, external genital rash or swelling denies any pelvic or abdominal pain  Breast cancer:   Due - will order Last Mammogram: *see HM list above BRCA gene screening:   none  Cervical cancer screening: UTD Pt  family hx of cancers - breast, ovarian, uterine, colon:    Maternal grandmother breast CA  Osteoporosis:   Discussion on osteoporosis per age, including high calcium and vitamin D supplementation, weight bearing exercises Pt is not supplementing with daily Vit D.  Previously prescribed Rx for low vit D   Skin cancer:  Hx of skin CA -  NO Discussed atypical lesions   Colorectal cancer:   Colonoscopy is due with age change  Discussed concerning signs and sx of CRC, pt denies melena hematochezia Lung cancer:   Low Dose CT Chest recommended if Age 27-80 years, 20 pack-year currently smoking OR have quit w/in 15years. Patient does not qualify.    Social History   Tobacco Use  . Smoking status: Current Every Day Smoker    Packs/day: 0.25    Years: 20.00    Pack years: 5.00    Types: Cigarettes  . Smokeless tobacco: Never Used  Vaping Use  . Vaping Use: Never used  Substance Use Topics  . Alcohol use: Not Currently     Comment: occas  . Drug use: No       Office Visit from 05/27/2020 in [facility]  AUDIT-C Score 1      Family History  Problem Relation Age of Onset  . Heart disease Father   . Alcohol abuse Father   . Cancer Maternal Grandmother        breast  . Cancer Paternal Grandmother        breast  . Breast cancer Paternal Grandmother   . Dementia Mother   . Thyroid disease Sister   .  Alcohol abuse Brother   . Alcohol abuse Brother      Blood pressure/Hypertension: BP Readings from Last 3 Encounters:  05/27/20 124/82  01/27/20 (!) 140/92  12/23/19 130/90    Weight/Obesity: Wt Readings from Last 3 Encounters:  05/27/20 195 lb 8 oz (88.7 kg)  01/27/20 200 lb 12 oz (91.1 kg)  12/23/19 203 lb (92.1 kg)   BMI Readings from Last 3 Encounters:  05/27/20 33.56 kg/m  01/27/20 35.00 kg/m  12/23/19 35.40 kg/m     Lipids:  Lab Results  Component Value Date   CHOL 167 12/10/2019   CHOL 161 12/03/2018   CHOL 167 05/10/2018   Lab Results  Component Value Date   HDL 43 12/10/2019   HDL 54 12/03/2018   HDL 48 (L) 05/10/2018   Lab Results  Component Value Date   LDLCALC 87 12/10/2019   LDLCALC 77 12/03/2018   LDLCALC 91 05/10/2018   Lab Results  Component Value Date   TRIG 187 (H) 12/10/2019   TRIG 205 (H) 12/03/2018   TRIG 186 (H) 05/10/2018   Lab Results  Component Value Date   CHOLHDL 3.9 12/10/2019   CHOLHDL 3.0 12/03/2018   CHOLHDL 3.5 05/10/2018   No results found for: LDLDIRECT Based on the results of lipid panel his/her cardiovascular risk factor ( using [place] Cohort )  in the next 10 years is: The 10-year ASCVD risk score Denman George DC Montez Hageman., et al., 2013) is: 8.6%   Values used to calculate the score:     Age: 54 years     Sex: Female     Is Non-Hispanic African American: No     Diabetic: Yes     Tobacco smoker: Yes     Systolic Blood Pressure: 124 mmHg     Is BP treated: Yes     HDL Cholesterol: 43 mg/dL     Total Cholesterol: 167  mg/dL Glucose:  Glucose, Bld  Date Value Ref Range Status  12/10/2019 97 70 - 99 mg/dL Final  47/52/9397 74 65 - 99 mg/dL Final    Comment:    .            Fasting reference interval .   10/29/2018 117 (H) 65 - 99 mg/dL Final    Comment:    .            Fasting reference interval . For someone without known diabetes, a glucose value between 100 and 125 mg/dL is consistent with prediabetes and should be confirmed with a follow-up test. .    Hypertension: BP Readings from Last 3 Encounters:  05/27/20 124/82  01/27/20 (!) 140/92  12/23/19 130/90   Obesity: Wt Readings from Last 3 Encounters:  05/27/20 195 lb 8 oz (88.7 kg)  01/27/20 200 lb 12 oz (91.1 kg)  12/23/19 203 lb (92.1 kg)   BMI Readings from Last 3 Encounters:  05/27/20 33.56 kg/m  01/27/20 35.00 kg/m  12/23/19 35.40 kg/m      Advanced Care Planning:  A voluntary discussion about advance care planning including the explanation and discussion of advance directives.   Discussed health care proxy and Living will, and the patient was able to identify a health care proxy as husband Viveca Beckstrom..   Patient does not have a living will at present time.   Social History      She        Social History   Socioeconomic History  . Marital status: Married    Spouse name:  robert  . Number of children: 1  . Years of education: Not on file  . Highest education level: Associate degree: occupational, Scientist, product/process development, or vocational program  Occupational History  . Not on file  Tobacco Use  . Smoking status: Current Every Day Smoker    Packs/day: 0.25    Years: 20.00    Pack years: 5.00    Types: Cigarettes  . Smokeless tobacco: Never Used  Vaping Use  . Vaping Use: Never used  Substance and Sexual Activity  . Alcohol use: Not Currently    Comment: occas  . Drug use: No  . Sexual activity: Yes    Birth control/protection: I.U.D.    Comment: paragard  Other Topics Concern  . Not on file  Social  History Narrative  . Not on file   Social Determinants of Health   Financial Resource Strain:   . Difficulty of Paying Living Expenses:   Food Insecurity:   . Worried About Programme researcher, broadcasting/film/video in the Last Year:   . Barista in the Last Year:   Transportation Needs:   . Freight forwarder (Medical):   Marland Kitchen Lack of Transportation (Non-Medical):   Physical Activity:   . Days of Exercise per Week:   . Minutes of Exercise per Session:   Stress:   . Feeling of Stress :   Social Connections:   . Frequency of Communication with Friends and Family:   . Frequency of Social Gatherings with Friends and Family:   . Attends Religious Services:   . Active Member of Clubs or Organizations:   . Attends Banker Meetings:   Marland Kitchen Marital Status:     Family History        Family History  Problem Relation Age of Onset  . Heart disease Father   . Alcohol abuse Father   . Cancer Maternal Grandmother        breast  . Cancer Paternal Grandmother        breast  . Breast cancer Paternal Grandmother   . Dementia Mother   . Thyroid disease Sister   . Alcohol abuse Brother   . Alcohol abuse Brother     Patient Active Problem List   Diagnosis Date Noted  . Mixed hyperlipidemia 04/18/2019  . Compulsive gambling 12/03/2018  . Diabetes mellitus with microalbuminuria (HCC) 08/09/2018  . Obesity (BMI 30.0-34.9) 08/09/2018  . Tobacco abuse 05/09/2018  . 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  . Essential hypertension, benign 05/09/2018  . Anxiety 01/11/2017  . Vitamin D deficiency 01/11/2017  . Fatty liver 08/10/2016  . Hypothyroid 04/22/2016  . Chronic neck pain 02/17/2016    Past Surgical History:  Procedure Laterality Date  . THYROIDECTOMY  2005     Current Outpatient Medications:  .  albuterol (VENTOLIN HFA) 108 (90 Base) MCG/ACT inhaler, Inhale 2 puffs into the lungs every 6 (six) hours as needed for wheezing or shortness of breath., Disp: 18 g, Rfl: 2 .  cyclobenzaprine  (FLEXERIL) 10 MG tablet, Take 1 tablet (10 mg total) by mouth every 8 (eight) hours as needed for muscle spasms., Disp: 90 tablet, Rfl: 1 .  FLUoxetine (PROZAC) 40 MG capsule, Take 1 capsule (40 mg total) by mouth daily., Disp: 90 capsule, Rfl: 1 .  levothyroxine (SYNTHROID) 25 MCG tablet, TAKE 1 TABLET BY MOUTH DAILY, Disp: 90 tablet, Rfl: 1 .  losartan (COZAAR) 50 MG tablet, TAKE 1 TABLET BY MOUTH DAILY FOR KIDNEY PROTECTION AND BLOOD PRESSURE, Disp: 90 tablet, Rfl:  1 .  metFORMIN (GLUCOPHAGE-XR) 500 MG 24 hr tablet, TAKE 1 TABLET BY MOUTH 2 TIMES DAILY., Disp: 60 tablet, Rfl: 3 .  PARAGARD INTRAUTERINE COPPER IU, by Intrauterine route., Disp: , Rfl:  .  atorvastatin (LIPITOR) 40 MG tablet, Take 1 tablet (40 mg total) by mouth daily., Disp: 90 tablet, Rfl: 3 .  fluticasone (FLONASE) 50 MCG/ACT nasal spray, Place 2 sprays into both nostrils daily as needed., Disp: 48 g, Rfl: 3 .  glucose blood (ACCU-CHEK GUIDE) test strip, Check fingerstick blood sugars once a day if desired; LON 99 months; Dx E11.9 (Patient not taking: Reported on 05/27/2020), Disp: 100 each, Rfl: 0 .  ReliOn Ultra Thin Lancets MISC, Check fingerstick blood sugars once a day if desired; LON 99 months, E11.9 (Patient not taking: Reported on 05/27/2020), Disp: 100 each, Rfl: 0  Allergies  Allergen Reactions  . Lisinopril     cough    Patient Care Team: Delsa Grana, PA-C as PCP - General (Family Medicine) Ursula Alert, MD as Consulting Physician (Psychiatry)  Review of Systems  Constitutional: Negative.  Negative for activity change, appetite change, fatigue and unexpected weight change.  HENT: Negative.   Eyes: Negative.   Respiratory: Negative.  Negative for shortness of breath.   Cardiovascular: Negative.  Negative for chest pain, palpitations and leg swelling.  Gastrointestinal: Negative.  Negative for abdominal pain and blood in stool.  Endocrine: Negative.   Genitourinary: Negative.   Musculoskeletal: Negative.   Negative for arthralgias, gait problem, joint swelling and myalgias.  Skin: Negative.  Negative for color change, pallor and rash.  Allergic/Immunologic: Negative.   Neurological: Negative.  Negative for syncope and weakness.  Hematological: Negative.   Psychiatric/Behavioral: Positive for decreased concentration and dysphoric mood. Negative for confusion, sleep disturbance and suicidal ideas. The patient is nervous/anxious.        I personally reviewed active problem list, medication list, allergies, family history, social history, health maintenance, notes from last encounter, lab results, imaging with the patient/caregiver today.         Objective:   Vitals:  Vitals:   05/27/20 0838  BP: 124/82  Pulse: 94  Resp: 16  Temp: (!) 97.5 F (36.4 C)  TempSrc: Temporal  SpO2: 97%  Weight: 195 lb 8 oz (88.7 kg)  Height: _0  (1.626 m)    Body mass index is 33.56 kg/m.  Physical Exam Vitals and nursing note reviewed.  Constitutional:      General: She is not in acute distress.    Appearance: Normal appearance. She is well-developed. She is obese. She is not ill-appearing, toxic-appearing or diaphoretic.     Interventions: Face mask in place.  HENT:     Head: Normocephalic and atraumatic.     Right Ear: External ear normal.     Left Ear: External ear normal.  Eyes:     General: Lids are normal. No scleral icterus.       Right eye: No discharge.        Left eye: No discharge.     Conjunctiva/sclera: Conjunctivae normal.     Pupils: Pupils are equal, round, and reactive to light.  Neck:     Trachea: Phonation normal. No tracheal deviation.  Cardiovascular:     Rate and Rhythm: Normal rate and regular rhythm.     Pulses: Normal pulses.          Radial pulses are 2+ on the right side and 2+ on the left side.  Posterior tibial pulses are 2+ on the right side and 2+ on the left side.     Heart sounds: Normal heart sounds. No murmur heard.  No friction rub. No gallop.    Pulmonary:     Effort: Pulmonary effort is normal. No respiratory distress.     Breath sounds: Normal breath sounds. No stridor. No wheezing, rhonchi or rales.  Chest:     Chest wall: No tenderness.     Breasts:        Right: Normal. No swelling, bleeding, inverted nipple, mass, nipple discharge, skin change or tenderness.        Left: Normal. No swelling, bleeding, inverted nipple, mass, nipple discharge, skin change or tenderness.  Abdominal:     General: Bowel sounds are normal. There is no distension.     Palpations: Abdomen is soft.     Tenderness: There is no abdominal tenderness. There is no right CVA tenderness, left CVA tenderness, guarding or rebound.  Genitourinary:    Comments: Declined pelvic exam  Musculoskeletal:        General: No deformity.     Cervical back: Normal range of motion and neck supple.     Right lower leg: No edema.     Left lower leg: No edema.  Lymphadenopathy:     Cervical: No cervical adenopathy.     Upper Body:     Right upper body: No supraclavicular, axillary or pectoral adenopathy.     Left upper body: No supraclavicular, axillary or pectoral adenopathy.  Skin:    General: Skin is warm and dry.     Coloration: Skin is not jaundiced or pale.     Findings: No rash.  Neurological:     Mental Status: She is alert.     Motor: No abnormal muscle tone.     Gait: Gait normal.  Psychiatric:        Attention and Perception: Attention normal.        Mood and Affect: Mood is anxious and depressed. Affect is tearful.        Speech: Speech normal.        Behavior: Behavior is cooperative.        Thought Content: Thought content normal. Thought content does not include homicidal or suicidal ideation. Thought content does not include homicidal or suicidal plan.       Fall Risk: Fall Risk  05/27/2020 11/29/2019 05/08/2019 04/18/2019 01/11/2019  Falls in the past year? 0 0 0 0 0  Number falls in past yr: 0 0 0 - 0  Injury with Fall? 0 0 0 - 0  Follow up  Falls evaluation completed Falls evaluation completed Falls evaluation completed - -    Functional Status Survey: Is the patient deaf or have difficulty hearing?: No Does the patient have difficulty seeing, even when wearing glasses/contacts?: No Does the patient have difficulty concentrating, remembering, or making decisions?: No Does the patient have difficulty walking or climbing stairs?: No Does the patient have difficulty dressing or bathing?: No Does the patient have difficulty doing errands alone such as visiting a doctor's office or shopping?: No   Assessment & Plan:    CPE completed today  . USPSTF grade A and B recommendations reviewed with patient; age-appropriate recommendations, preventive care, screening tests, etc discussed and encouraged; healthy living encouraged; see AVS for patient education given to patient  . Discussed importance of 150 minutes of physical activity weekly, AHA exercise recommendations given to pt in AVS/handout  . Discussed  importance of healthy diet:  eating lean meats and proteins, avoiding trans fats and saturated fats, avoid simple sugars and excessive carbs in diet, eat 6 servings of fruit/vegetables daily and drink plenty of water and avoid sweet beverages.    . Recommended pt to do annual eye exam and routine dental exams/cleanings  . Depression, alcohol, fall screening completed as documented above and per flowsheets  . Reviewed Health Maintenance: Health Maintenance  Topic Date Due  . Hepatitis C Screening  Never done  . TETANUS/TDAP  Never done  . FOOT EXAM  12/04/2019  . MAMMOGRAM  01/12/2020  . COVID-19 Vaccine (1) 06/12/2020 (Originally 02/01/1983)  . HIV Screening  11/28/2020 (Originally 01/31/1986)  . HEMOGLOBIN A1C  06/08/2020  . INFLUENZA VACCINE  06/21/2020  . OPHTHALMOLOGY EXAM  08/06/2020  . PAP SMEAR-Modifier  01/11/2022  . PNEUMOCOCCAL POLYSACCHARIDE VACCINE AGE 35-64 HIGH RISK  Completed     . Immunizations: Immunization History  Administered Date(s) Administered  . Influenza,inj,Quad PF,6+ Mos 08/09/2018, 08/22/2019  . Pneumococcal Polysaccharide-23 03/27/2017      1. Adult general medical exam - CBC with Differential/Platelet - COMPLETE METABOLIC PANEL WITH GFR - Lipid panel - Hemoglobin A1c - TSH - Hepatitis C antibody  2. Encounter for medication monitoring - CBC with Differential/Platelet - COMPLETE METABOLIC PANEL WITH GFR - Lipid panel - Hemoglobin A1c - TSH - Microalbumin, urine  3. Encounter for screening mammogram for malignant neoplasm of breast Breast exam done today, mammogram ordered and patient was given contact information to schedule - MM 3D SCREEN BREAST BILATERAL; Future  4. Essential hypertension, benign BP at goal today, meds refilled - losartan (COZAAR) 50 MG tablet; Take 1 tablet (50 mg total) by mouth daily.  Dispense: 90 tablet; Refill: 3 - COMPLETE METABOLIC PANEL WITH GFR  5. Diabetes mellitus with microalbuminuria ([place]) She needs to return for OV to further address DM- due for foot exam, did not have time today, labs done She is compliant with ARB, statin, and tolerating her Metformin Eye exam due in September - losartan (COZAAR) 50 MG tablet; Take 1 tablet (50 mg total) by mouth daily.  Dispense: 90 tablet; Refill: 3 - metFORMIN (GLUCOPHAGE-XR) 500 MG 24 hr tablet; Take 1 tablet (500 mg total) by mouth 2 (two) times daily.  Dispense: 180 tablet; Refill: 3 - atorvastatin (LIPITOR) 40 MG tablet; Take 1 tablet (40 mg total) by mouth daily.  Dispense: 90 tablet; Refill: 3 - COMPLETE METABOLIC PANEL WITH GFR - Lipid panel - Hemoglobin A1c - Microalbumin, urine  6. Anxiety Sound like she may have a history of social anxiety, she also alluded to some childhood trauma possibly or at least growing up in a home where she was exposed to stressful situations, she nodded her head when we discussed alcohol use for parents with  anxiety She tried to do therapy once but it was very difficult for her did not go very well.  She is open to therapy. Encouraged her to reach out to [facility] to see if there are any appts available She will need to look at [facility] network specialists and initiate est care with them She was extremely tearful today.  On high dose Prozac she likely could benefit from changing medicines or adding something to Prozac?  Work with a psychiatrist or therapist/clinical psychologist would be very helpful with what sounds like a long history of symptoms. Significant amount of time today spent talking to her -very tearful - FLUoxetine (PROZAC) 40 MG capsule; Take 1  capsule (40 mg total) by mouth daily.  Dispense: 90 capsule; Refill: 3  7. Other specified hypothyroidism No symptoms concerning for chemical hypo or hyperthyroid, check labs, meds refilled but explained to patient that if labs are abnormal we may need to change her prescription - levothyroxine (SYNTHROID) 25 MCG tablet; Take 1 tablet (25 mcg total) by mouth daily.  Dispense: 90 tablet; Refill: 3 - TSH  8. Mixed hyperlipidemia Endorses good compliance, no myalgias or side effects - atorvastatin (LIPITOR) 40 MG tablet; Take 1 tablet (40 mg total) by mouth daily.  Dispense: 90 tablet; Refill: 3 - COMPLETE METABOLIC PANEL WITH GFR - Lipid panel  9. Encounter for hepatitis C screening test for low risk patient - Hepatitis C antibody  10. Obesity (BMI 30.0-34.9) Healthy diet and lifestyle and exercise guidelines encouraged as noted above  11. Social anxiety disorder See #6  12. Vitamin D deficiency - VITAMIN D 25 Hydroxy (Vit-D Deficiency, Fractures)  13. Fatty liver Encouraged her to avoid processed foods, work on vegetables fruits lean meat in diet and exercising and weight loss - COMPLETE METABOLIC PANEL WITH GFR - Lipid panel - Hemoglobin A1c  14. Subacute vaginitis She endorses some mild vaginal itching states that she gets this  often times in the summer with hotter weather or with [place] or at [place] beach she denies any discharge, does not want any STD testing, has no abdominal pain nausea or vomiting -self swab to assess for yeast or BV - Cervicovaginal ancillary only  15. Allergic rhinitis, unspecified seasonality, unspecified trigger Refill given - fluticasone (FLONASE) 50 MCG/ACT nasal spray; Place 2 sprays into both nostrils daily as needed.  Dispense: 48 g; Refill: 3  16. Mild intermittent asthma, unspecified whether complicated Patient states that he hot humid weather triggers her asthma more often but she is only using rescue inhaler about 2-3 times a week, no nighttime waking, no significant limitations and she has not been treated with steroids in many years Discussed asthma control, encouraged her to avoid triggers and follow-up if she has any increased use of albuterol or nighttime waking         Delsa Grana, PA-C 05/27/20 8:55 AM  [facility]

## 2020-05-28 LAB — HEPATITIS C ANTIBODY
Hepatitis C Ab: NONREACTIVE
SIGNAL TO CUT-OFF: 0 (ref <1.00)

## 2020-05-28 LAB — CBC WITH DIFFERENTIAL/PLATELET
Absolute Monocytes: 642 cells/uL (ref 200–950)
Basophils Absolute: 84 cells/uL (ref 0–200)
Basophils Relative: 0.9 %
Eosinophils Absolute: 270 cells/uL (ref 15–500)
Eosinophils Relative: 2.9 %
HCT: 40.3 % (ref 35.0–45.0)
Hemoglobin: 13.2 g/dL (ref 11.7–15.5)
Lymphs Abs: 2120 cells/uL (ref 850–3900)
MCH: 27.7 pg (ref 27.0–33.0)
MCHC: 32.8 g/dL (ref 32.0–36.0)
MCV: 84.7 fL (ref 80.0–100.0)
MPV: 11.4 fL (ref 7.5–12.5)
Monocytes Relative: 6.9 %
Neutro Abs: 6185 cells/uL (ref 1500–7800)
Neutrophils Relative %: 66.5 %
Platelets: 316 10*3/uL (ref 140–400)
RBC: 4.76 10*6/uL (ref 3.80–5.10)
RDW: 13.1 % (ref 11.0–15.0)
Total Lymphocyte: 22.8 %
WBC: 9.3 10*3/uL (ref 3.8–10.8)

## 2020-05-28 LAB — HEMOGLOBIN A1C
Hgb A1c MFr Bld: 5.8 % of total Hgb — ABNORMAL HIGH (ref <5.7)
Mean Plasma Glucose: 120 (calc)
eAG (mmol/L): 6.6 (calc)

## 2020-05-28 LAB — LIPID PANEL
Cholesterol: 163 mg/dL (ref <200)
HDL: 52 mg/dL (ref >=50)
LDL Cholesterol (Calc): 82 mg/dL (calc)
Non-HDL Cholesterol (Calc): 111 mg/dL (calc) (ref <130)
Total CHOL/HDL Ratio: 3.1 (calc) (ref <5.0)
Triglycerides: 193 mg/dL — ABNORMAL HIGH (ref <150)

## 2020-05-28 LAB — COMPLETE METABOLIC PANEL WITH GFR
AG Ratio: 1.9 (calc) (ref 1.0–2.5)
ALT: 18 U/L (ref 6–29)
AST: 15 U/L (ref 10–35)
Albumin: 4.3 g/dL (ref 3.6–5.1)
Alkaline phosphatase (APISO): 95 U/L (ref 31–125)
BUN: 11 mg/dL (ref 7–25)
CO2: 26 mmol/L (ref 20–32)
Calcium: 9.5 mg/dL (ref 8.6–10.2)
Chloride: 104 mmol/L (ref 98–110)
Creat: 0.64 mg/dL (ref 0.50–1.10)
GFR, Est African American: 121 mL/min/{1.73_m2} (ref >=60)
GFR, Est Non African American: 105 mL/min/{1.73_m2} (ref >=60)
Globulin: 2.3 g/dL (calc) (ref 1.9–3.7)
Glucose, Bld: 83 mg/dL (ref 65–99)
Potassium: 4.5 mmol/L (ref 3.5–5.3)
Sodium: 138 mmol/L (ref 135–146)
Total Bilirubin: 0.4 mg/dL (ref 0.2–1.2)
Total Protein: 6.6 g/dL (ref 6.1–8.1)

## 2020-05-28 LAB — TSH: TSH: 0.66 mIU/L

## 2020-05-28 LAB — MICROALBUMIN, URINE: Microalb, Ur: 1.2 mg/dL

## 2020-05-28 LAB — CERVICOVAGINAL ANCILLARY ONLY
Bacterial Vaginitis (gardnerella): POSITIVE — AB
Candida Glabrata: NEGATIVE
Candida Vaginitis: NEGATIVE
Comment: NEGATIVE
Comment: NEGATIVE
Comment: NEGATIVE

## 2020-05-28 LAB — VITAMIN D 25 HYDROXY (VIT D DEFICIENCY, FRACTURES): Vit D, 25-Hydroxy: 19 ng/mL — ABNORMAL LOW (ref 30–100)

## 2020-05-29 ENCOUNTER — Encounter: Payer: Self-pay | Admitting: Family Medicine

## 2020-05-29 ENCOUNTER — Other Ambulatory Visit: Payer: Self-pay | Admitting: Family Medicine

## 2020-05-29 DIAGNOSIS — B9689 Other specified bacterial agents as the cause of diseases classified elsewhere: Secondary | ICD-10-CM

## 2020-05-29 DIAGNOSIS — N760 Acute vaginitis: Secondary | ICD-10-CM

## 2020-05-29 MED ORDER — METRONIDAZOLE 500 MG PO TABS: 500.0000 mg | ORAL_TABLET | Freq: Two times a day (BID) | ORAL | 0 refills | Status: DC

## 2020-05-29 NOTE — Progress Notes (Signed)
Called pt this evening. The message from pt to send in flagyl did not come back to me, after reviewing her chart I saw Jamie's message.  Pt is out of town right now, she states sx improved and she's not currently having irritation/itching/vaginal discharge.  I offered to send meds to where she currently is, but she asked me to send to [place] and she'll take when she gets back if she needs to.    ICD-10-CM   1. BV (bacterial vaginosis)  N76.0 metroNIDAZOLE (FLAGYL) 500 MG tablet   B96.89

## 2020-06-02 ENCOUNTER — Other Ambulatory Visit: Payer: Self-pay | Admitting: Family Medicine

## 2020-06-02 DIAGNOSIS — M62838 Other muscle spasm: Secondary | ICD-10-CM

## 2020-06-10 ENCOUNTER — Ambulatory Visit
Admission: EM | Admit: 2020-06-10 | Discharge: 2020-06-10 | Disposition: A | Discharge status: Home / Self Care | Payer: No Typology Code available for payment source | Attending: Family Medicine | Admitting: Family Medicine

## 2020-06-10 ENCOUNTER — Other Ambulatory Visit: Payer: Self-pay

## 2020-06-10 ENCOUNTER — Ambulatory Visit (INDEPENDENT_AMBULATORY_CARE_PROVIDER_SITE_OTHER): Payer: No Typology Code available for payment source

## 2020-06-10 ENCOUNTER — Encounter: Payer: Self-pay | Admitting: Emergency Medicine

## 2020-06-10 DIAGNOSIS — M25552 Pain in left hip: Secondary | ICD-10-CM | POA: Diagnosis not present

## 2020-06-10 MED ORDER — KETOROLAC TROMETHAMINE 10 MG PO TABS: 10.0000 mg | ORAL_TABLET | Freq: Four times a day (QID) | ORAL | 0 refills | Status: DC | PRN

## 2020-06-10 NOTE — ED Provider Notes (Signed)
[facility]    CSN: [id number] Arrival date & time: 06/10/20  0931      History   Chief Complaint Chief Complaint  Patient presents with  . Fall  . Hip Pain    left   HPI  49 year old female presents with the above complaint.  Patient reports that she suffered a fall yesterday.  She states that she slipped on some water and fell on the hardwood floor.  She landed on her left hip.  She reports ongoing left hip pain of the lateral hip.  Pain 6/10 in severity.  Patient is unsure if she has any bruising.  Pain described as achy.  No relieving factors.  Patient states that she is having difficulty ambulating.  No other associated symptoms.  No other complaints.  Past Medical History:  Diagnosis Date  . Anxiety   . Depression   . Diabetes mellitus without complication ([place])   . Diabetes mellitus, type II ([place])   . Fatigue   . Hypertension   . Hypothyroidism   . Panic attack   . Vitamin D deficiency     Patient Active Problem List   Diagnosis Date Noted  . Mixed hyperlipidemia 04/18/2019  . Compulsive gambling 12/03/2018  . Diabetes mellitus with microalbuminuria ([place]) 08/09/2018  . Obesity (BMI 30.0-34.9) 08/09/2018  . Tobacco abuse 05/09/2018  . 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  . Essential hypertension, benign 05/09/2018  . Anxiety 01/11/2017  . Vitamin D deficiency 01/11/2017  . Fatty liver 08/10/2016  . Hypothyroid 04/22/2016  . Chronic neck pain 02/17/2016    Past Surgical History:  Procedure Laterality Date  . THYROIDECTOMY  2005    OB History    Gravida  1   Para  1   Term      Preterm      AB      Living        SAB      TAB      Ectopic      Multiple      Live Births               Home Medications    Prior to Admission medications   Medication Sig Start Date End Date Taking? Authorizing Provider  albuterol (VENTOLIN HFA) 108 (90 Base) MCG/ACT inhaler Inhale 2 puffs into the lungs every 6 (six) hours as needed for  wheezing or shortness of breath. 11/29/19  Yes Hubbard Hartshorn, FNP  atorvastatin (LIPITOR) 40 MG tablet Take 1 tablet (40 mg total) by mouth daily. 05/27/20  Yes Delsa Grana, PA-C  cyclobenzaprine (FLEXERIL) 10 MG tablet TAKE 1 TABLET BY MOUTH EVERY 8 HOURS AS NEEDED FOR MUSCLE SPASMS. 06/02/20  Yes Delsa Grana, PA-C  FLUoxetine (PROZAC) 40 MG capsule Take 1 capsule (40 mg total) by mouth daily. 05/27/20  Yes Delsa Grana, PA-C  fluticasone (FLONASE) 50 MCG/ACT nasal spray Place 2 sprays into both nostrils daily as needed. 05/27/20  Yes Delsa Grana, PA-C  levothyroxine (SYNTHROID) 25 MCG tablet Take 1 tablet (25 mcg total) by mouth daily. 05/27/20  Yes Delsa Grana, PA-C  losartan (COZAAR) 50 MG tablet Take 1 tablet (50 mg total) by mouth daily. 05/27/20  Yes Delsa Grana, PA-C  metFORMIN (GLUCOPHAGE-XR) 500 MG 24 hr tablet Take 1 tablet (500 mg total) by mouth 2 (two) times daily. 05/27/20  Yes Delsa Grana, PA-C  metroNIDAZOLE (FLAGYL) 500 MG tablet Take 1 tablet (500 mg total) by mouth 2 (two) times daily. 05/29/20  Yes Delsa Grana, PA-C  PARAGARD INTRAUTERINE COPPER IU by Intrauterine route.   Yes [provider]  glucose blood (ACCU-CHEK GUIDE) test strip Check fingerstick blood sugars once a day if desired; LON 99 months; Dx E11.9 Patient not taking: Reported on 05/27/2020 05/15/18   Arnetha Courser, MD  ketorolac (TORADOL) 10 MG tablet Take 1 tablet (10 mg total) by mouth every 6 (six) hours as needed for moderate pain or severe pain. 06/10/20   Coral Spikes, DO  ReliOn Ultra Thin Lancets MISC Check fingerstick blood sugars once a day if desired; LON 99 months, E11.9 Patient not taking: Reported on 05/27/2020 05/15/18   Arnetha Courser, MD    Family History Family History  Problem Relation Age of Onset  . Heart disease Father   . Alcohol abuse Father   . Cancer Maternal Grandmother        breast  . Cancer Paternal Grandmother        breast  . Breast cancer Paternal Grandmother   . Dementia  Mother   . Thyroid disease Sister   . Alcohol abuse Brother   . Alcohol abuse Brother     Social History Social History   Tobacco Use  . Smoking status: Current Every Day Smoker    Packs/day: 0.25    Years: 20.00    Pack years: 5.00    Types: Cigarettes  . Smokeless tobacco: Never Used  Vaping Use  . Vaping Use: Never used  Substance Use Topics  . Alcohol use: Not Currently    Comment: occas  . Drug use: No     Allergies   Lisinopril   Review of Systems Review of Systems  Constitutional: Negative.   Musculoskeletal:       Left hip pain.    Physical Exam Triage Vital Signs ED Triage Vitals  Enc Vitals Group     BP 06/10/20 1005 134/88     Pulse Rate 06/10/20 1005 85     Resp 06/10/20 1005 18     Temp 06/10/20 1005 98 F (36.7 C)     Temp Source 06/10/20 1005 Oral     SpO2 06/10/20 1005 98 %     Weight 06/10/20 1003 195 lb (88.5 kg)     Height 06/10/20 1003 5\' 3"  (1.6 m)     Head Circumference --      Peak Flow --      Pain Score 06/10/20 1003 6     Pain Loc --      Pain Edu? --      Excl. in [place]? --    Updated Vital Signs BP 134/88 (BP Location: Right Arm)   Pulse 85   Temp 98 F (36.7 C) (Oral)   Resp 18   Ht 5\' 3"  (1.6 m)   Wt 88.5 kg   LMP 05/21/2020 Comment: ireeg premenopausal, denies period, signed preg waiver  SpO2 98%   BMI 34.54 kg/m   Visual Acuity Right Eye Distance:   Left Eye Distance:   Bilateral Distance:    Right Eye Near:   Left Eye Near:    Bilateral Near:     Physical Exam Vitals and nursing note reviewed.  Constitutional:      General: She is not in acute distress.    Appearance: Normal appearance. She is not ill-appearing.  HENT:     Head: Normocephalic and atraumatic.  Eyes:     General:        Right eye: No discharge.  Left eye: No discharge.     Conjunctiva/sclera: Conjunctivae normal.  Cardiovascular:     Rate and Rhythm: Normal rate and regular rhythm.  Pulmonary:     Effort: Pulmonary effort is  normal.     Breath sounds: Normal breath sounds. No wheezing, rhonchi or rales.  Musculoskeletal:     Comments: Left hip -tenderness over the lateral trochanter.  Neurological:     Mental Status: She is alert.  Psychiatric:        Mood and Affect: Mood normal.        Behavior: Behavior normal.    UC Treatments / Results  Labs (all labs ordered are listed, but only abnormal results are displayed) Labs Reviewed - No data to display  EKG   Radiology DG Hip Unilat W or Wo Pelvis 2-3 Views Left  Result Date: 06/10/2020 CLINICAL DATA:  Fall.  Status post injury. EXAM: DG HIP (WITH OR WITHOUT PELVIS) 2-3V LEFT COMPARISON:  None. FINDINGS: There is no evidence of hip fracture or dislocation. There is no evidence of arthropathy or other focal bone abnormality. IUD is noted within the pelvis. IMPRESSION: Negative. Electronically Signed   By: Kerby Moors M.D.   On: 06/10/2020 11:23    Procedures Procedures (including critical care time)  Medications Ordered in UC Medications - No data to display  Initial Impression / Assessment and Plan / UC Course  I have reviewed the triage vital signs and the nursing notes.  Pertinent labs & imaging results that were available during my care of the patient were reviewed by me and considered in my medical decision making (see chart for details).    49 year old female presents with left hip pain after suffering a fall.  X-ray obtained and independently reviewed by me.  Independent interpretation: No acute fracture or acute findings.  Toradol as needed for pain.  Supportive care.  Work note given.  Final Clinical Impressions(s) / UC Diagnoses   Final diagnoses:  Left hip pain     Discharge Instructions     Rest.  Ice.  Medication as directed.  Take care  Dr. Lacinda Axon    ED Prescriptions    Medication Sig Dispense Auth. Provider   ketorolac (TORADOL) 10 MG tablet Take 1 tablet (10 mg total) by mouth every 6 (six) hours as needed for  moderate pain or severe pain. 20 tablet Coral Spikes, DO     PDMP not reviewed this encounter.   Coral Spikes, [place] 06/10/20 1135

## 2020-06-10 NOTE — ED Triage Notes (Signed)
Patient states she fell yesterday after slipping on some water. She states she fell on her left hip and is having pain with walking.

## 2020-06-10 NOTE — Discharge Instructions (Signed)
Rest. Ice.   Medication as directed.  Take care  Dr. Chundra Sauerwein  

## 2020-07-13 ENCOUNTER — Encounter: Payer: Self-pay | Admitting: Family Medicine

## 2020-07-14 ENCOUNTER — Telehealth (INDEPENDENT_AMBULATORY_CARE_PROVIDER_SITE_OTHER): Payer: No Typology Code available for payment source | Admitting: Family Medicine

## 2020-07-14 ENCOUNTER — Other Ambulatory Visit: Payer: Self-pay

## 2020-07-14 ENCOUNTER — Encounter: Payer: Self-pay | Admitting: Family Medicine

## 2020-07-14 DIAGNOSIS — J4520 Mild intermittent asthma, uncomplicated: Secondary | ICD-10-CM | POA: Diagnosis not present

## 2020-07-14 DIAGNOSIS — U071 COVID-19: Secondary | ICD-10-CM

## 2020-07-14 DIAGNOSIS — R0789 Other chest pain: Secondary | ICD-10-CM

## 2020-07-14 DIAGNOSIS — R0602 Shortness of breath: Secondary | ICD-10-CM

## 2020-07-14 DIAGNOSIS — H9201 Otalgia, right ear: Secondary | ICD-10-CM

## 2020-07-14 DIAGNOSIS — J069 Acute upper respiratory infection, unspecified: Secondary | ICD-10-CM

## 2020-07-14 MED ORDER — PREDNISONE 20 MG PO TABS: 40.0000 mg | ORAL_TABLET | Freq: Every day | ORAL | 0 refills | Status: AC

## 2020-07-14 MED ORDER — BENZONATATE 100 MG PO CAPS: 100.0000 mg | ORAL_CAPSULE | Freq: Three times a day (TID) | ORAL | 1 refills | Status: DC | PRN

## 2020-07-14 MED ORDER — ALBUTEROL SULFATE HFA 108 (90 BASE) MCG/ACT IN AERS: 2.0000 | INHALATION_SPRAY | Freq: Four times a day (QID) | RESPIRATORY_TRACT | 2 refills | Status: DC | PRN

## 2020-07-14 NOTE — Progress Notes (Signed)
Name: Julie Marquez   MRN: [id number]    DOB: August 20, 1971   Date:07/14/2020       Progress Note  Subjective:    Chief Complaint  Chief Complaint  Patient presents with  . Otalgia    ear pain for 4 days  . Covid Positive    symptoms 7 days cough, congested, headache    I connected with  Diamon L Tritch on 07/14/20 at  3:20 PM EDT by telephone and verified that I am speaking with the correct person using two identifiers.   I discussed the limitations, risks, security and privacy concerns of performing an evaluation and management service by telephone and the availability of in person appointments. Staff also discussed with the patient that there may be a patient responsible charge related to this service. Patient Location: home Provider Location: [facility] Additional Individuals present: none  HPI:  Pt started having sx on 07/06/2020  Pt is currently symptomatic and COVID + Pt had severe fatigue - has been in bed for 7 days, finally feels like she is starting to turn the corner a little Cough is getting a little better Some mild chest discomfort to left chest Still fatigued and having sweats when doing some minor activity walking around her home Complains of right ear pain described an ache that comes and goes.  Otalgia  There is pain in the right ear. This is a new problem. The current episode started in the past 7 days. The problem occurs constantly. The problem has been unchanged. There has been no fever. The pain is at a severity of 4/10. The pain is mild. Associated symptoms include coughing, headaches and rhinorrhea. Pertinent negatives include no abdominal pain, diarrhea, ear discharge, hearing loss, neck pain, rash or sore throat. She has tried nothing for the symptoms. There is no history of a chronic ear infection, hearing loss or a tympanostomy tube.   Hx of asthma - she doesn't have an inhaler right now     Patient Active Problem List   Diagnosis Date Noted    . Mild intermittent asthma 07/14/2020  . Mixed hyperlipidemia 04/18/2019  . Compulsive gambling 12/03/2018  . Diabetes mellitus with microalbuminuria ([place]) 08/09/2018  . Obesity (BMI 30.0-34.9) 08/09/2018  . Tobacco abuse 05/09/2018  . 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  . Essential hypertension, benign 05/09/2018  . Anxiety 01/11/2017  . Vitamin D deficiency 01/11/2017  . Fatty liver 08/10/2016  . Hypothyroid 04/22/2016  . Chronic neck pain 02/17/2016    Social History   Tobacco Use  . Smoking status: Current Every Day Smoker    Packs/day: 0.25    Years: 20.00    Pack years: 5.00    Types: Cigarettes  . Smokeless tobacco: Never Used  Substance Use Topics  . Alcohol use: Not Currently    Comment: occas     Current Outpatient Medications:  .  albuterol (VENTOLIN HFA) 108 (90 Base) MCG/ACT inhaler, Inhale 2 puffs into the lungs every 6 (six) hours as needed for wheezing or shortness of breath., Disp: 18 g, Rfl: 2 .  atorvastatin (LIPITOR) 40 MG tablet, Take 1 tablet (40 mg total) by mouth daily., Disp: 90 tablet, Rfl: 3 .  benzonatate (TESSALON) 100 MG capsule, Take 1 capsule (100 mg total) by mouth 3 (three) times daily as needed for cough., Disp: 30 capsule, Rfl: 1 .  cyclobenzaprine (FLEXERIL) 10 MG tablet, TAKE 1 TABLET BY MOUTH EVERY 8 HOURS AS NEEDED FOR MUSCLE SPASMS., Disp: 90 tablet, Rfl: 1 .  FLUoxetine (PROZAC) 40 MG capsule, Take 1 capsule (40 mg total) by mouth daily., Disp: 90 capsule, Rfl: 3 .  fluticasone (FLONASE) 50 MCG/ACT nasal spray, Place 2 sprays into both nostrils daily as needed., Disp: 48 g, Rfl: 3 .  glucose blood (ACCU-CHEK GUIDE) test strip, Check fingerstick blood sugars once a day if desired; LON 99 months; Dx E11.9 (Patient not taking: Reported on 05/27/2020), Disp: 100 each, Rfl: 0 .  ketorolac (TORADOL) 10 MG tablet, Take 1 tablet (10 mg total) by mouth every 6 (six) hours as needed for moderate pain or severe pain., Disp: 20 tablet, Rfl: 0 .   levothyroxine (SYNTHROID) 25 MCG tablet, Take 1 tablet (25 mcg total) by mouth daily., Disp: 90 tablet, Rfl: 3 .  losartan (COZAAR) 50 MG tablet, Take 1 tablet (50 mg total) by mouth daily., Disp: 90 tablet, Rfl: 3 .  metFORMIN (GLUCOPHAGE-XR) 500 MG 24 hr tablet, Take 1 tablet (500 mg total) by mouth 2 (two) times daily., Disp: 180 tablet, Rfl: 3 .  metroNIDAZOLE (FLAGYL) 500 MG tablet, Take 1 tablet (500 mg total) by mouth 2 (two) times daily., Disp: 14 tablet, Rfl: 0 .  PARAGARD INTRAUTERINE COPPER IU, by Intrauterine route., Disp: , Rfl:  .  predniSONE (DELTASONE) 20 MG tablet, Take 2 tablets (40 mg total) by mouth daily with breakfast for 5 days., Disp: 10 tablet, Rfl: 0 .  ReliOn Ultra Thin Lancets MISC, Check fingerstick blood sugars once a day if desired; LON 99 months, E11.9 (Patient not taking: Reported on 05/27/2020), Disp: 100 each, Rfl: 0  Allergies  Allergen Reactions  . Lisinopril     cough    Chart Review: I personally reviewed active problem list, medication list, allergies, family history, social history, health maintenance, notes from last encounter, lab results, imaging with the patient/caregiver today.   ROS:  10 Systems reviewed and are negative for acute change except as noted in the HPI.    Objective:    Virtual encounter, vitals limited, only able to obtain the following Today's Vitals   07/14/20 1350  Temp: 99 F (37.2 C)   There is no height or weight on file to calculate BMI. Nursing Note and Vital Signs reviewed.  Physical Exam Vitals and nursing note reviewed.  Constitutional:      Appearance: She is obese.  Pulmonary:     Effort: No respiratory distress.  Neurological:     Mental Status: She is alert.   intermittent coughing, no audible wheeze, stridor or respiratory distress  PE limited by telephone encounter  No results found for this or any previous visit (from the past 72 hour(s)).  Assessment and Plan:   1. Upper respiratory tract  infection due to COVID-19 virus Onset sx 8/16 or 8/17 - isolate until at least 07/17/2020 - can complete isolation if sx improving, fever free and past noted date Red flags and ER precautions reviewed with pt today - she verbalized understanding  2. Otalgia of right ear Intermittent ache, suspect ETD - encouraged antihistamines, flonase and decongestants No current indication for oral antibiotics  3. Mild intermittent asthma, unspecified whether complicated Cough and respiratory sx are improving but she feels some chest tightness and wheeze - like asthma exacerbation - tx with rescue inhaler, cough meds, steroid burts - albuterol (VENTOLIN HFA) 108 (90 Base) MCG/ACT inhaler; Inhale 2 puffs into the lungs every 6 (six) hours as needed for wheezing or shortness of breath.  Dispense: 18 g; Refill: 2 - benzonatate (TESSALON) 100 MG  capsule; Take 1 capsule (100 mg total) by mouth 3 (three) times daily as needed for cough.  Dispense: 30 capsule; Refill: 1 - predniSONE (DELTASONE) 20 MG tablet; Take 2 tablets (40 mg total) by mouth daily with breakfast for 5 days.  Dispense: 10 tablet; Refill: 0  4. Chest discomfort Tx for asthma/bronchitis - need in person assessment if any sx worsen - albuterol (VENTOLIN HFA) 108 (90 Base) MCG/ACT inhaler; Inhale 2 puffs into the lungs every 6 (six) hours as needed for wheezing or shortness of breath.  Dispense: 18 g; Refill: 2  5. Shortness of breath See #4 - albuterol (VENTOLIN HFA) 108 (90 Base) MCG/ACT inhaler; Inhale 2 puffs into the lungs every 6 (six) hours as needed for wheezing or shortness of breath.  Dispense: 18 g; Refill: 2   -Red flags and when to present for emergency care or RTC including but not limited to new/worsening/un-resolving symptoms,  reviewed with patient at time of visit. Follow up and care instructions discussed and provided in AVS. - I discussed the assessment and treatment plan with the patient. The patient was provided an opportunity  to ask questions and all were answered. The patient agreed with the plan and demonstrated an understanding of the instructions.  - The patient was advised to call back or seek an in-person evaluation if the symptoms worsen or if the condition fails to improve as anticipated.  I provided 30+ minutes of non-face-to-face time during this encounter.  Delsa Grana, PA-C 07/14/20 6:37 PM

## 2020-08-04 ENCOUNTER — Ambulatory Visit: Payer: No Typology Code available for payment source

## 2020-12-21 ENCOUNTER — Telehealth: Payer: Self-pay | Admitting: Cardiology

## 2020-12-21 NOTE — Telephone Encounter (Signed)
3 attempts to schedule fu appt from recall list.   Deleting recall.   

## 2021-03-03 ENCOUNTER — Other Ambulatory Visit: Payer: Self-pay

## 2021-03-03 ENCOUNTER — Telehealth: Payer: Self-pay | Admitting: Family Medicine

## 2021-03-03 DIAGNOSIS — M62838 Other muscle spasm: Secondary | ICD-10-CM

## 2021-03-03 DIAGNOSIS — J309 Allergic rhinitis, unspecified: Secondary | ICD-10-CM

## 2021-03-03 MED FILL — Levothyroxine Sodium Tab 25 MCG: ORAL | 90 days supply | Qty: 90 | Fill #0 | Status: AC

## 2021-03-03 MED FILL — Fluoxetine HCl Cap 40 MG: ORAL | 90 days supply | Qty: 90 | Fill #0 | Status: AC

## 2021-03-03 MED FILL — Metformin HCl Tab ER 24HR 500 MG: ORAL | 90 days supply | Qty: 180 | Fill #0 | Status: AC

## 2021-03-03 MED FILL — Losartan Potassium Tab 50 MG: ORAL | 90 days supply | Qty: 90 | Fill #0 | Status: AC

## 2021-03-04 ENCOUNTER — Other Ambulatory Visit: Payer: Self-pay

## 2021-03-04 MED ORDER — CYCLOBENZAPRINE HCL 10 MG PO TABS
ORAL_TABLET | Freq: Three times a day (TID) | ORAL | 0 refills | Status: DC | PRN
  Filled 2021-03-04: qty 180, 60d supply, fill #0

## 2021-03-04 MED ORDER — FLUTICASONE PROPIONATE 50 MCG/ACT NA SUSP
2.0000 | Freq: Every day | NASAL | 3 refills | Status: DC | PRN
  Filled 2021-03-04: qty 48, 90d supply, fill #0

## 2021-03-04 NOTE — Telephone Encounter (Signed)
Called pt to inform of the refills and to get her scheduled before 9/22 and she states she will schedule on her MyChart once she checks her schedule

## 2021-04-22 ENCOUNTER — Other Ambulatory Visit: Payer: Self-pay

## 2021-04-22 ENCOUNTER — Ambulatory Visit: Payer: No Typology Code available for payment source | Admitting: Family Medicine

## 2021-05-06 ENCOUNTER — Encounter: Payer: Self-pay | Admitting: Family Medicine

## 2021-05-21 ENCOUNTER — Ambulatory Visit: Payer: No Typology Code available for payment source | Admitting: Family Medicine

## 2021-05-23 MED FILL — Atorvastatin Calcium Tab 40 MG (Base Equivalent): ORAL | 90 days supply | Qty: 90 | Fill #0 | Status: AC

## 2021-05-25 ENCOUNTER — Other Ambulatory Visit: Payer: Self-pay

## 2021-05-27 ENCOUNTER — Other Ambulatory Visit: Payer: Self-pay

## 2021-06-03 ENCOUNTER — Other Ambulatory Visit: Payer: Self-pay

## 2021-06-03 ENCOUNTER — Encounter: Payer: Self-pay | Admitting: Unknown Physician Specialty

## 2021-06-03 ENCOUNTER — Ambulatory Visit (INDEPENDENT_AMBULATORY_CARE_PROVIDER_SITE_OTHER): Payer: No Typology Code available for payment source | Admitting: Unknown Physician Specialty

## 2021-06-03 VITALS — BP 128/82 | HR 87 | Temp 98.3°F | Resp 16 | Ht 64.0 in | Wt 202.0 lb

## 2021-06-03 DIAGNOSIS — Z0000 Encounter for general adult medical examination without abnormal findings: Secondary | ICD-10-CM

## 2021-06-03 NOTE — Patient Instructions (Signed)
Preventive Care 50-50 Years Old, Female Preventive care refers to lifestyle choices and visits with your health care provider that can promote health and wellness. This includes: A yearly physical exam. This is also called an annual wellness visit. Regular dental and eye exams. Immunizations. Screening for certain conditions. Healthy lifestyle choices, such as: Eating a healthy diet. Getting regular exercise. Not using drugs or products that contain nicotine and tobacco. Limiting alcohol use. What can I expect for my preventive care visit? Physical exam Your health care provider will check your: Height and weight. These may be used to calculate your BMI (body mass index). BMI is a measurement that tells if you are at a healthy weight. Heart rate and blood pressure. Body temperature. Skin for abnormal spots. Counseling Your health care provider may ask you questions about your: Past medical problems. Family's medical history. Alcohol, tobacco, and drug use. Emotional well-being. Home life and relationship well-being. Sexual activity. Diet, exercise, and sleep habits. Work and work Statistician. Access to firearms. Method of birth control. Menstrual cycle. Pregnancy history. What immunizations do I need?  Vaccines are usually given at various ages, according to a schedule. Your health care provider will recommend vaccines for you based on your age, medicalhistory, and lifestyle or other factors, such as travel or where you work. What tests do I need? Blood tests Lipid and cholesterol levels. These may be checked every 5 years, or more often if you are over 37 years old. Hepatitis C test. Hepatitis B test. Screening Lung cancer screening. You may have this screening every year starting at age 30 if you have a 30-pack-year history of smoking and currently smoke or have quit within the past 15 years. Colorectal cancer screening. All adults should have this screening starting at  age 23 and continuing until age 3. Your health care provider may recommend screening at age 88 if you are at increased risk. You will have tests every 1-10 years, depending on your results and the type of screening test. Diabetes screening. This is done by checking your blood sugar (glucose) after you have not eaten for a while (fasting). You may have this done every 1-3 years. Mammogram. This may be done every 1-2 years. Talk with your health care provider about when you should start having regular mammograms. This may depend on whether you have a family history of breast cancer. BRCA-related cancer screening. This may be done if you have a family history of breast, ovarian, tubal, or peritoneal cancers. Pelvic exam and Pap test. This may be done every 3 years starting at age 79. Starting at age 54, this may be done every 5 years if you have a Pap test in combination with an HPV test. Other tests STD (sexually transmitted disease) testing, if you are at risk. Bone density scan. This is done to screen for osteoporosis. You may have this scan if you are at high risk for osteoporosis. Talk with your health care provider about your test results, treatment options,and if necessary, the need for more tests. Follow these instructions at home: Eating and drinking  Eat a diet that includes fresh fruits and vegetables, whole grains, lean protein, and low-fat dairy products. Take vitamin and mineral supplements as recommended by your health care provider. Do not drink alcohol if: Your health care provider tells you not to drink. You are pregnant, may be pregnant, or are planning to become pregnant. If you drink alcohol: Limit how much you have to 0-1 drink a day. Be aware  of how much alcohol is in your drink. In the [place] one drink equals one 12 oz bottle of beer (355 mL), one 5 oz glass of wine (148 mL), or one 1 oz glass of hard liquor (44 mL).  Lifestyle Take daily care of your teeth and  gums. Brush your teeth every morning and night with fluoride toothpaste. Floss one time each day. Stay active. Exercise for at least 30 minutes 5 or more days each week. Do not use any products that contain nicotine or tobacco, such as cigarettes, e-cigarettes, and chewing tobacco. If you need help quitting, ask your health care provider. Do not use drugs. If you are sexually active, practice safe sex. Use a condom or other form of protection to prevent STIs (sexually transmitted infections). If you do not wish to become pregnant, use a form of birth control. If you plan to become pregnant, see your health care provider for a prepregnancy visit. If told by your health care provider, take low-dose aspirin daily starting at age 29. Find healthy ways to cope with stress, such as: Meditation, yoga, or listening to music. Journaling. Talking to a trusted person. Spending time with friends and family. Safety Always wear your seat belt while driving or riding in a vehicle. Do not drive: If you have been drinking alcohol. Do not ride with someone who has been drinking. When you are tired or distracted. While texting. Wear a helmet and other protective equipment during sports activities. If you have firearms in your house, make sure you follow all gun safety procedures. What's next? Visit your health care provider once a year for an annual wellness visit. Ask your health care provider how often you should have your eyes and teeth checked. Stay up to date on all vaccines. This information is not intended to replace advice given to you by your health care provider. Make sure you discuss any questions you have with your healthcare provider. Document Revised: 08/11/2020 Document Reviewed: 07/19/2018 Elsevier Patient Education  2022 [employer].

## 2021-06-03 NOTE — Progress Notes (Signed)
BP 128/82   Pulse 87   Temp 98.3 F (36.8 C) (Oral)   Resp 16   Ht 5\' 4"  (1.626 m)   Wt 202 lb (91.6 kg)   LMP 06/03/2021   SpO2 98%   BMI 34.67 kg/m    Subjective:    Patient ID: Julie Marquez, female    DOB: [birth year], 50 y.o.   MRN: [id number]  HPI: Julie Marquez is a 50 y.o. female  Chief Complaint  Patient presents with   Annual Exam   Pt is here for physical exam.  She would like to have her chronic disease visit at another time for insurance reasons.     Social History   Socioeconomic History   Marital status: Married    Spouse name: robert   Number of children: 1   Years of education: Not on file   Highest education level: Associate degree: occupational, Hotel manager, or vocational program  Occupational History   Not on file  Tobacco Use   Smoking status: Every Day    Packs/day: 0.25    Years: 20.00    Pack years: 5.00    Types: Cigarettes   Smokeless tobacco: Never  Vaping Use   Vaping Use: Never used  Substance and Sexual Activity   Alcohol use: Not Currently    Comment: occas   Drug use: No   Sexual activity: Yes    Birth control/protection: I.U.D.    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   Social Determinants of Health   Financial Resource Strain: Low Risk    Difficulty of Paying Living Expenses: Not hard at all  Food Insecurity: No Food Insecurity   Worried About Charity fundraiser in the Last Year: Never true   Arboriculturist in the Last Year: Never true  Transportation Needs: No Transportation Needs   Lack of Transportation (Medical): No   Lack of Transportation (Non-Medical): No  Physical Activity: Insufficiently Active   Days of Exercise per Week: 3 days   Minutes of Exercise per Session: 30 min  Stress: No Stress Concern Present   Feeling of Stress : Only a little  Social Connections: Engineer, building services of Communication with  Friends and Family: More than three times a week   Frequency of Social Gatherings with Friends and Family: More than three times a week   Attends Religious Services: 1 to 4 times per year   Active Member of [employer] or Organizations: Yes   Attends Music therapist: More than 4 times per year   Marital Status: Married  Human resources officer Violence: Not At Risk   Fear of Current or Ex-Partner: No   Emotionally Abused: No   Physically Abused: No   Sexually Abused: No   Past Medical History:  Diagnosis Date   Anxiety    Depression    Diabetes mellitus without complication ([place])    Diabetes mellitus, type II ([place])    Fatigue    Hypertension    Hypothyroidism    Panic attack    Vitamin D deficiency    Family History  Problem Relation Age of Onset   Heart disease Father    Alcohol abuse Father    Cancer Maternal Grandmother        breast   Cancer Paternal Grandmother        breast   Breast  cancer Paternal Grandmother    Dementia Mother    Thyroid disease Sister    Alcohol abuse Brother    Alcohol abuse Brother    Past Surgical History:  Procedure Laterality Date   THYROIDECTOMY  2005     Relevant past medical, surgical, family and social history reviewed and updated as indicated. Interim medical history since our last visit reviewed. Allergies and medications reviewed and updated.  Review of Systems  Per HPI unless specifically indicated above     Objective:    BP 128/82   Pulse 87   Temp 98.3 F (36.8 C) (Oral)   Resp 16   Ht 5\' 4"  (1.626 m)   Wt 202 lb (91.6 kg)   LMP 06/03/2021   SpO2 98%   BMI 34.67 kg/m   Wt Readings from Last 3 Encounters:  06/03/21 202 lb (91.6 kg)  06/10/20 195 lb (88.5 kg)  05/27/20 195 lb 8 oz (88.7 kg)    Physical Exam Constitutional:      General: She is not in acute distress.    Appearance: Normal appearance. She is well-developed. She is not ill-appearing.  HENT:     Head: Normocephalic and atraumatic.      Right Ear: Tympanic membrane normal.     Left Ear: Tympanic membrane normal.     Nose: Nose normal.     Mouth/Throat:     Mouth: Mucous membranes are dry.  Eyes:     General: No scleral icterus.       Right eye: No discharge.        Left eye: No discharge.     Pupils: Pupils are equal, round, and reactive to light.  Neck:     Thyroid: No thyromegaly.     Vascular: No carotid bruit.  Cardiovascular:     Rate and Rhythm: Normal rate and regular rhythm.     Heart sounds: Normal heart sounds. No murmur heard.   No friction rub. No gallop.  Pulmonary:     Effort: Pulmonary effort is normal. No respiratory distress.     Breath sounds: Normal breath sounds. No wheezing or rales.  Chest:  Breasts:    Right: Normal. No mass, nipple discharge or skin change.     Left: Normal. No mass, nipple discharge or skin change.  Abdominal:     General: Bowel sounds are normal.     Palpations: Abdomen is soft.     Tenderness: There is no abdominal tenderness. There is no right CVA tenderness, left CVA tenderness or rebound.  Musculoskeletal:        General: Normal range of motion.     Cervical back: Normal range of motion and neck supple.  Lymphadenopathy:     Cervical: No cervical adenopathy.  Skin:    General: Skin is warm and dry.     Findings: No rash.  Neurological:     Mental Status: She is alert and oriented to person, place, and time.  Psychiatric:        Speech: Speech normal.        Behavior: Behavior normal.        Thought Content: Thought content normal.        Judgment: Judgment normal.    Results for orders placed or performed in visit on 05/27/20  CBC with Differential/Platelet  Result Value Ref Range   WBC 9.3 3.8 - 10.8 Thousand/uL   RBC 4.76 3.80 - 5.10 Million/uL   Hemoglobin 13.2 11.7 - 15.5 g/dL  HCT 40.3 35.0 - 45.0 %   MCV 84.7 80.0 - 100.0 fL   MCH 27.7 27.0 - 33.0 pg   MCHC 32.8 32.0 - 36.0 g/dL   RDW 13.1 11.0 - 15.0 %   Platelets 316 140 - 400 Thousand/uL    MPV 11.4 7.5 - 12.5 fL   Neutro Abs 6,185 1,500 - 7,800 cells/uL   Lymphs Abs 2,120 850 - 3,900 cells/uL   Absolute Monocytes 642 200 - 950 cells/uL   Eosinophils Absolute 270 15 - 500 cells/uL   Basophils Absolute 84 0 - 200 cells/uL   Neutrophils Relative % 66.5 %   Total Lymphocyte 22.8 %   Monocytes Relative 6.9 %   Eosinophils Relative 2.9 %   Basophils Relative 0.9 %  COMPLETE METABOLIC PANEL WITH GFR  Result Value Ref Range   Glucose, Bld 83 65 - 99 mg/dL   BUN 11 7 - 25 mg/dL   Creat 0.64 0.50 - 1.10 mg/dL   GFR, Est Non African American 105 > OR = 60 mL/min/1.24m2   GFR, Est African American 121 > OR = 60 mL/min/1.20m2   BUN/Creatinine Ratio NOT APPLICABLE 6 - 22 (calc)   Sodium 138 135 - 146 mmol/L   Potassium 4.5 3.5 - 5.3 mmol/L   Chloride 104 98 - 110 mmol/L   CO2 26 20 - 32 mmol/L   Calcium 9.5 8.6 - 10.2 mg/dL   Total Protein 6.6 6.1 - 8.1 g/dL   Albumin 4.3 3.6 - 5.1 g/dL   Globulin 2.3 1.9 - 3.7 g/dL (calc)   AG Ratio 1.9 1.0 - 2.5 (calc)   Total Bilirubin 0.4 0.2 - 1.2 mg/dL   Alkaline phosphatase (APISO) 95 31 - 125 U/L   AST 15 10 - 35 U/L   ALT 18 6 - 29 U/L  Lipid panel  Result Value Ref Range   Cholesterol 163 <200 mg/dL   HDL 52 > OR = 50 mg/dL   Triglycerides 193 (H) <150 mg/dL   LDL Cholesterol (Calc) 82 mg/dL (calc)   Total CHOL/HDL Ratio 3.1 <5.0 (calc)   Non-HDL Cholesterol (Calc) 111 <130 mg/dL (calc)  Hemoglobin A1c  Result Value Ref Range   Hgb A1c MFr Bld 5.8 (H) <5.7 % of total Hgb   Mean Plasma Glucose 120 (calc)   eAG (mmol/L) 6.6 (calc)  TSH  Result Value Ref Range   TSH 0.66 mIU/L  Microalbumin, urine  Result Value Ref Range   Microalb, Ur 1.2 mg/dL   RAM    Hepatitis C antibody  Result Value Ref Range   Hepatitis C Ab NON-REACTIVE NON-REACTI   SIGNAL TO CUT-OFF 0.00 <1.00  VITAMIN D 25 Hydroxy (Vit-D Deficiency, Fractures)  Result Value Ref Range   Vit D, 25-Hydroxy 19 (L) 30 - 100 ng/mL  Cervicovaginal ancillary  only  Result Value Ref Range   Bacterial Vaginitis (gardnerella) Positive (A)    Candida Vaginitis Negative    Candida Glabrata Negative    Comment Normal Reference Range Candida Species - Negative    Comment Normal Reference Range Candida Galbrata - Negative    Comment      Normal Reference Range Bacterial Vaginosis - Negative      Assessment & Plan:   Problem List Items Addressed This Visit   None Visit Diagnoses     Routine general medical examination at a health care facility    -  Primary   Relevant Orders   Cologuard   MM Digital Diagnostic Bilat  CBC with Differential/Platelet   COMPLETE METABOLIC PANEL WITH GFR   Hemoglobin A1c   HIV Antibody (routine testing w rflx)   Lipid panel   TSH   VITAMIN D 25 Hydroxy (Vit-D Deficiency, Fractures)       Health maintenance Declines STD screening Elects for Cologuard Will schedule mammogram Pap due in 2025  Follow up plan: Return in about 4 weeks (around 07/01/2021) for Needs chronic disease visit.

## 2021-06-04 LAB — CBC WITH DIFFERENTIAL/PLATELET
Absolute Monocytes: 646 cells/uL (ref 200–950)
Basophils Absolute: 61 cells/uL (ref 0–200)
Basophils Relative: 0.6 %
Eosinophils Absolute: 293 cells/uL (ref 15–500)
Eosinophils Relative: 2.9 %
HCT: 40.4 % (ref 35.0–45.0)
Hemoglobin: 13 g/dL (ref 11.7–15.5)
Lymphs Abs: 2475 cells/uL (ref 850–3900)
MCH: 26.5 pg — ABNORMAL LOW (ref 27.0–33.0)
MCHC: 32.2 g/dL (ref 32.0–36.0)
MCV: 82.4 fL (ref 80.0–100.0)
MPV: 11.5 fL (ref 7.5–12.5)
Monocytes Relative: 6.4 %
Neutro Abs: 6626 cells/uL (ref 1500–7800)
Neutrophils Relative %: 65.6 %
Platelets: 330 10*3/uL (ref 140–400)
RBC: 4.9 10*6/uL (ref 3.80–5.10)
RDW: 13.7 % (ref 11.0–15.0)
Total Lymphocyte: 24.5 %
WBC: 10.1 10*3/uL (ref 3.8–10.8)

## 2021-06-04 LAB — COMPLETE METABOLIC PANEL WITH GFR
AG Ratio: 1.6 (calc) (ref 1.0–2.5)
ALT: 17 U/L (ref 6–29)
AST: 16 U/L (ref 10–35)
Albumin: 4.1 g/dL (ref 3.6–5.1)
Alkaline phosphatase (APISO): 95 U/L (ref 37–153)
BUN: 13 mg/dL (ref 7–25)
CO2: 25 mmol/L (ref 20–32)
Calcium: 9.4 mg/dL (ref 8.6–10.4)
Chloride: 103 mmol/L (ref 98–110)
Creat: 0.63 mg/dL (ref 0.50–1.03)
Globulin: 2.5 g/dL (calc) (ref 1.9–3.7)
Glucose, Bld: 78 mg/dL (ref 65–99)
Potassium: 4.4 mmol/L (ref 3.5–5.3)
Sodium: 136 mmol/L (ref 135–146)
Total Bilirubin: 0.4 mg/dL (ref 0.2–1.2)
Total Protein: 6.6 g/dL (ref 6.1–8.1)
eGFR: 108 mL/min/{1.73_m2} (ref >=60)

## 2021-06-04 LAB — HEMOGLOBIN A1C
Hgb A1c MFr Bld: 6.1 % of total Hgb — ABNORMAL HIGH (ref <5.7)
Mean Plasma Glucose: 128 mg/dL
eAG (mmol/L): 7.1 mmol/L

## 2021-06-04 LAB — LIPID PANEL
Cholesterol: 157 mg/dL (ref <200)
HDL: 52 mg/dL (ref >=50)
LDL Cholesterol (Calc): 71 mg/dL (calc)
Non-HDL Cholesterol (Calc): 105 mg/dL (calc) (ref <130)
Total CHOL/HDL Ratio: 3 (calc) (ref <5.0)
Triglycerides: 246 mg/dL — ABNORMAL HIGH (ref <150)

## 2021-06-04 LAB — HIV ANTIBODY (ROUTINE TESTING W REFLEX): HIV 1&2 Ab, 4th Generation: NONREACTIVE

## 2021-06-04 LAB — TSH: TSH: 0.46 mIU/L

## 2021-06-08 ENCOUNTER — Other Ambulatory Visit: Payer: Self-pay | Admitting: Family Medicine

## 2021-06-08 DIAGNOSIS — Z1231 Encounter for screening mammogram for malignant neoplasm of breast: Secondary | ICD-10-CM

## 2021-06-10 ENCOUNTER — Encounter (INDEPENDENT_AMBULATORY_CARE_PROVIDER_SITE_OTHER): Payer: Self-pay

## 2021-06-10 ENCOUNTER — Other Ambulatory Visit: Payer: Self-pay

## 2021-06-10 ENCOUNTER — Other Ambulatory Visit: Payer: Self-pay | Admitting: Family Medicine

## 2021-06-10 DIAGNOSIS — R809 Proteinuria, unspecified: Secondary | ICD-10-CM

## 2021-06-10 DIAGNOSIS — I10 Essential (primary) hypertension: Secondary | ICD-10-CM

## 2021-06-10 DIAGNOSIS — E1129 Type 2 diabetes mellitus with other diabetic kidney complication: Secondary | ICD-10-CM

## 2021-06-10 MED ORDER — METFORMIN HCL ER 500 MG PO TB24
ORAL_TABLET | Freq: Two times a day (BID) | ORAL | 3 refills | Status: DC
  Filled 2021-06-10: qty 180, 90d supply, fill #0
  Filled 2021-09-01: qty 180, 90d supply, fill #1
  Filled 2021-11-26: qty 180, 90d supply, fill #2

## 2021-06-10 MED ORDER — LOSARTAN POTASSIUM 50 MG PO TABS
ORAL_TABLET | Freq: Every day | ORAL | 3 refills | Status: DC
Start: 2021-06-10 — End: 2022-07-07
  Filled 2021-06-10: qty 90, 90d supply, fill #0
  Filled 2021-09-22: qty 90, 90d supply, fill #1
  Filled 2021-12-31: qty 90, 90d supply, fill #2
  Filled 2022-04-01: qty 90, 90d supply, fill #3

## 2021-06-11 ENCOUNTER — Ambulatory Visit
Admission: RE | Admit: 2021-06-11 | Discharge: 2021-06-11 | Disposition: A | Discharge status: Home / Self Care | Payer: No Typology Code available for payment source | Source: Ambulatory Visit | Attending: Family Medicine | Admitting: Family Medicine

## 2021-06-11 ENCOUNTER — Other Ambulatory Visit: Payer: Self-pay

## 2021-06-11 DIAGNOSIS — Z1231 Encounter for screening mammogram for malignant neoplasm of breast: Secondary | ICD-10-CM | POA: Insufficient documentation

## 2021-06-16 ENCOUNTER — Other Ambulatory Visit: Payer: Self-pay | Admitting: Family Medicine

## 2021-06-16 DIAGNOSIS — E038 Other specified hypothyroidism: Secondary | ICD-10-CM

## 2021-06-16 DIAGNOSIS — F419 Anxiety disorder, unspecified: Secondary | ICD-10-CM

## 2021-06-17 ENCOUNTER — Ambulatory Visit: Payer: No Typology Code available for payment source | Admitting: Family Medicine

## 2021-06-17 ENCOUNTER — Other Ambulatory Visit: Payer: Self-pay

## 2021-06-17 ENCOUNTER — Other Ambulatory Visit: Payer: Self-pay | Admitting: Family Medicine

## 2021-06-17 ENCOUNTER — Other Ambulatory Visit: Payer: Self-pay | Admitting: Emergency Medicine

## 2021-06-17 ENCOUNTER — Other Ambulatory Visit: Payer: Self-pay | Admitting: Unknown Physician Specialty

## 2021-06-17 DIAGNOSIS — E038 Other specified hypothyroidism: Secondary | ICD-10-CM

## 2021-06-17 DIAGNOSIS — R195 Other fecal abnormalities: Secondary | ICD-10-CM

## 2021-06-17 DIAGNOSIS — F419 Anxiety disorder, unspecified: Secondary | ICD-10-CM

## 2021-06-17 LAB — COLOGUARD: Cologuard: POSITIVE — AB

## 2021-06-17 MED ORDER — LEVOTHYROXINE SODIUM 25 MCG PO TABS
ORAL_TABLET | ORAL | 3 refills | Status: DC
Start: 2021-06-17 — End: 2021-07-09
  Filled 2021-06-17: qty 90, 90d supply, fill #0

## 2021-06-17 MED ORDER — FLUOXETINE HCL 40 MG PO CAPS
ORAL_CAPSULE | Freq: Every day | ORAL | 3 refills | Status: DC
Start: 2021-06-17 — End: 2022-07-07
  Filled 2021-06-17: qty 90, 90d supply, fill #0
  Filled 2021-09-22: qty 90, 90d supply, fill #1
  Filled 2021-12-31: qty 90, 90d supply, fill #2
  Filled 2022-04-01: qty 90, 90d supply, fill #3

## 2021-06-21 ENCOUNTER — Encounter: Payer: Self-pay | Admitting: Family Medicine

## 2021-07-01 ENCOUNTER — Telehealth (INDEPENDENT_AMBULATORY_CARE_PROVIDER_SITE_OTHER): Payer: Self-pay | Admitting: Gastroenterology

## 2021-07-01 ENCOUNTER — Other Ambulatory Visit: Payer: Self-pay

## 2021-07-01 DIAGNOSIS — Z1211 Encounter for screening for malignant neoplasm of colon: Secondary | ICD-10-CM

## 2021-07-01 DIAGNOSIS — R195 Other fecal abnormalities: Secondary | ICD-10-CM

## 2021-07-01 MED ORDER — CLENPIQ 10-3.5-12 MG-GM -GM/160ML PO SOLN
1.0000 | ORAL | 0 refills | Status: DC
  Filled 2021-07-01: qty 320, 1d supply, fill #0

## 2021-07-01 NOTE — Progress Notes (Signed)
Gastroenterology Pre-Procedure Review  Request Date: 08/06/2021 Requesting Physician: Dr. Bonna Gains  PATIENT REVIEW QUESTIONS: The patient responded to the following health history questions as indicated:    1. Are you having any GI issues? yes (hemorrhoids but doesn't bother her) 2. Do you have a personal history of Polyps? no 3. Do you have a family history of Colon Cancer or Polyps? no 4. Diabetes Mellitus? yes (type 2) 5. Joint replacements in the past 12 months?no 6. Major health problems in the past 3 months?no 7. Any artificial heart valves, MVP, or defibrillator?no    MEDICATIONS & ALLERGIES:    Patient reports the following regarding taking any anticoagulation/antiplatelet therapy:   Plavix, Coumadin, Eliquis, Xarelto, Lovenox, Pradaxa, Brilinta, or Effient? no Aspirin? no  Patient confirms/reports the following medications:  Current Outpatient Medications  Medication Sig Dispense Refill   albuterol (VENTOLIN HFA) 108 (90 Base) MCG/ACT inhaler Inhale 2 puffs into the lungs every 6 (six) hours as needed for wheezing or shortness of breath. 18 g 2   atorvastatin (LIPITOR) 40 MG tablet TAKE 1 TABLET BY MOUTH DAILY. 90 tablet 3   benzonatate (TESSALON) 100 MG capsule Take 1 capsule (100 mg total) by mouth 3 (three) times daily as needed for cough. 30 capsule 1   cyclobenzaprine (FLEXERIL) 10 MG tablet TAKE 1 TABLET BY MOUTH EVERY 8 HOURS AS NEEDED FOR MUSCLE SPASMS. 180 tablet 0   FLUoxetine (PROZAC) 40 MG capsule TAKE 1 CAPSULE (40 MG TOTAL) BY MOUTH DAILY. 90 capsule 3   fluticasone (FLONASE) 50 MCG/ACT nasal spray Place 2 sprays into both nostrils daily as needed. 48 g 3   glucose blood (ACCU-CHEK GUIDE) test strip Check fingerstick blood sugars once a day if desired; LON 99 months; Dx E11.9 100 each 0   ketorolac (TORADOL) 10 MG tablet Take 1 tablet (10 mg total) by mouth every 6 (six) hours as needed for moderate pain or severe pain. 20 tablet 0   levothyroxine (SYNTHROID) 25  MCG tablet TAKE 1 TABLET (25 MCG TOTAL) BY MOUTH DAILY. 90 tablet 3   losartan (COZAAR) 50 MG tablet TAKE 1 TABLET (50 MG TOTAL) BY MOUTH DAILY. 90 tablet 3   metFORMIN (GLUCOPHAGE-XR) 500 MG 24 hr tablet TAKE 1 TABLET BY MOUTH TWICE DAILY 180 tablet 3   PARAGARD INTRAUTERINE COPPER IU by Intrauterine route.     ReliOn Ultra Thin Lancets MISC Check fingerstick blood sugars once a day if desired; LON 99 months, E11.9 100 each 0   metroNIDAZOLE (FLAGYL) 500 MG tablet Take 1 tablet (500 mg total) by mouth 2 (two) times daily. (Patient not taking: Reported on 07/01/2021) 14 tablet 0   No current facility-administered medications for this visit.    Patient confirms/reports the following allergies:  Allergies  Allergen Reactions   Lisinopril     cough    No orders of the defined types were placed in this encounter.   AUTHORIZATION INFORMATION Primary Insurance: 1D#: Group #:  Secondary Insurance: 1D#: Group #:  SCHEDULE INFORMATION: Date: 08/06/2021 Time: Location: [place]

## 2021-07-02 ENCOUNTER — Other Ambulatory Visit: Payer: Self-pay

## 2021-07-09 ENCOUNTER — Other Ambulatory Visit: Payer: Self-pay

## 2021-07-09 ENCOUNTER — Encounter: Payer: Self-pay | Admitting: Family Medicine

## 2021-07-09 ENCOUNTER — Ambulatory Visit (INDEPENDENT_AMBULATORY_CARE_PROVIDER_SITE_OTHER): Payer: No Typology Code available for payment source | Admitting: Family Medicine

## 2021-07-09 VITALS — BP 126/78 | HR 95 | Temp 98.2°F | Resp 16 | Ht 64.0 in | Wt 198.2 lb

## 2021-07-09 DIAGNOSIS — Z23 Encounter for immunization: Secondary | ICD-10-CM | POA: Diagnosis not present

## 2021-07-09 DIAGNOSIS — J4540 Moderate persistent asthma, uncomplicated: Secondary | ICD-10-CM

## 2021-07-09 DIAGNOSIS — E1129 Type 2 diabetes mellitus with other diabetic kidney complication: Secondary | ICD-10-CM

## 2021-07-09 DIAGNOSIS — F419 Anxiety disorder, unspecified: Secondary | ICD-10-CM

## 2021-07-09 DIAGNOSIS — Z5181 Encounter for therapeutic drug level monitoring: Secondary | ICD-10-CM

## 2021-07-09 DIAGNOSIS — F320 Major depressive disorder, single episode, mild: Secondary | ICD-10-CM

## 2021-07-09 DIAGNOSIS — G4719 Other hypersomnia: Secondary | ICD-10-CM

## 2021-07-09 DIAGNOSIS — E782 Mixed hyperlipidemia: Secondary | ICD-10-CM

## 2021-07-09 DIAGNOSIS — E669 Obesity, unspecified: Secondary | ICD-10-CM

## 2021-07-09 DIAGNOSIS — E038 Other specified hypothyroidism: Secondary | ICD-10-CM

## 2021-07-09 DIAGNOSIS — Z6834 Body mass index (BMI) 34.0-34.9, adult: Secondary | ICD-10-CM

## 2021-07-09 DIAGNOSIS — R5383 Other fatigue: Secondary | ICD-10-CM

## 2021-07-09 DIAGNOSIS — I10 Essential (primary) hypertension: Secondary | ICD-10-CM

## 2021-07-09 DIAGNOSIS — R809 Proteinuria, unspecified: Secondary | ICD-10-CM

## 2021-07-09 MED ORDER — ALBUTEROL SULFATE HFA 108 (90 BASE) MCG/ACT IN AERS
2.0000 | INHALATION_SPRAY | Freq: Four times a day (QID) | RESPIRATORY_TRACT | 2 refills | Status: DC | PRN
  Filled 2021-07-09: qty 18, 25d supply, fill #0

## 2021-07-09 MED ORDER — ATORVASTATIN CALCIUM 40 MG PO TABS
40.0000 mg | ORAL_TABLET | Freq: Every day | ORAL | 3 refills | Status: DC
  Filled 2021-07-09 – 2021-09-01 (×2): qty 90, 90d supply, fill #0
  Filled 2021-11-26: qty 90, 90d supply, fill #1
  Filled 2022-03-01: qty 90, 90d supply, fill #2
  Filled 2022-06-09: qty 90, 90d supply, fill #3

## 2021-07-09 MED ORDER — FLUTICASONE-SALMETEROL 250-50 MCG/ACT IN AEPB
1.0000 | INHALATION_SPRAY | Freq: Two times a day (BID) | RESPIRATORY_TRACT | 5 refills | Status: DC
  Filled 2021-07-09: qty 60, 30d supply, fill #0

## 2021-07-09 MED ORDER — LEVOTHYROXINE SODIUM 25 MCG PO TABS
ORAL_TABLET | ORAL | 0 refills | Status: DC
  Filled 2021-07-09: qty 135, fill #0
  Filled 2021-09-22: qty 135, 90d supply, fill #0

## 2021-07-09 MED ORDER — TRULICITY 0.75 MG/0.5ML ~~LOC~~ SOAJ
0.7500 mg | SUBCUTANEOUS | 0 refills | Status: AC
  Filled 2021-07-09: qty 6, 84d supply, fill #0

## 2021-07-09 NOTE — Patient Instructions (Signed)
Increase thyroid med dose - per prescription change  Start daily maintenance inhaler  See if trulicity is covered and add to metformin  Let me know what insurance says about a sleep study  Return in 3 months to follow up on everything above and Vit D deficiency

## 2021-07-09 NOTE — Progress Notes (Signed)
Name: Julie Marquez   MRN: [id number]    DOB: December 25, 1970   Date:07/09/2021       Progress Note  Chief Complaint  Patient presents with   Diabetes   Hyperlipidemia   Hypertension   Hypothyroidism     Subjective:   Julie Marquez is a 50 y.o. female, presents to clinic for routine f/up  Most labs done recently with CPE  DM:  well controlled, she asks about addiing GLP-1 inhibitor On metformin, tolerating Denies: Polyuria, polydipsia, vision changes, neuropathy, hypoglycemia Recent pertinent labs: Lab Results  Component Value Date   HGBA1C 6.1 (H) 06/03/2021   HGBA1C 5.8 (H) 05/27/2020   HGBA1C 6.0 (H) 12/10/2019   Lab Results  Component Value Date   MICROALBUR 1.2 05/27/2020   LDLCALC 71 06/03/2021   CREATININE 0.63 06/03/2021   Standard of care and health maintenance: Foot exam:  done today DM eye exam:  due On statin and ARB  Asthma/COPD - using albuterol inhaler ever day and usually twice a day, always triggered with simple activity, no nighttime waking with wheeze  07/09/2021   1143  Asthma History   Symptoms Daily  Nighttime Awakenings 0-2/month  Asthma interference with normal activity Extreme limitations  SABA use (not for EIB) Several times/day  Risk: Exacerbations requiring oral systemic steroids 0-1 / year  Asthma Severity Moderate Persistent    Extremely sleepy and fatigued - needing almost two naps a day, recent thyroid labs were WNL, she had no anemia and DM was well controlled  Hypothyroid Lab Results  Component Value Date   TSH 0.46 06/03/2021  On levothyroxine 25 mcg daily  ESS - score of see - see chart   07/09/21 1142  Epworth Sleepiness Scale  Sitting and reading 0  Watching TV 2  Sitting, inactive in a public place (e.g. a theatre or a meeting) 0  As a passenger in a car for an hour without a break 1  Lying down to rest in the afternoon when circumstances permit 2  Sitting and talking to someone 0  Sitting quietly after  a lunch without alcohol 1  In a car, while stopped for a few minutes in traffic 0  Total score 6    Hyperlipidemia: Currently treated with on lipitor 40 mg, pt reports good med compliance Last Lipids: Lab Results  Component Value Date   CHOL 157 06/03/2021   HDL 52 06/03/2021   LDLCALC 71 06/03/2021   TRIG 246 (H) 06/03/2021   CHOLHDL 3.0 06/03/2021   - Denies: Chest pain, shortness of breath, myalgias, claudication  Obesity -  Frustrated about not being able to loose weight  Wt Readings from Last 5 Encounters:  07/09/21 198 lb 3.2 oz (89.9 kg)  06/03/21 202 lb (91.6 kg)  06/10/20 195 lb (88.5 kg)  05/27/20 195 lb 8 oz (88.7 kg)  01/27/20 200 lb 12 oz (91.1 kg)   BMI Readings from Last 5 Encounters:  07/09/21 34.02 kg/m  06/03/21 34.67 kg/m  06/10/20 34.54 kg/m  05/27/20 33.56 kg/m  01/27/20 35.00 kg/m   Hypertension:  Currently managed on losartan 50  Pt reports good med compliance and denies any SE.   Blood pressure today is well controlled. BP Readings from Last 3 Encounters:  07/09/21 126/78  06/03/21 128/82  06/10/20 134/88   Pt denies CP, SOB, exertional sx, LE edema, palpitation, Ha's, visual disturbances, lightheadedness, hypotension, syncope.  MDD- on prozac 40 mg daily - score positive - mostly fatigue and sleep  difficulties Depression screen [facility] 2/9 07/09/2021 06/03/2021 07/14/2020  Decreased Interest 1 0 0  Down, Depressed, Hopeless 1 1 0  PHQ - 2 Score 2 1 0  Altered sleeping 3 0 -  Tired, decreased energy 3 0 -  Change in appetite 0 0 -  Feeling bad or failure about yourself  0 0 -  Trouble concentrating 1 0 -  Moving slowly or fidgety/restless 0 0 -  Suicidal thoughts 0 0 -  PHQ-9 Score 9 1 -  Difficult doing work/chores Not difficult at all Not difficult at all -  Some recent data might be hidden       Current Outpatient Medications:    albuterol (VENTOLIN HFA) 108 (90 Base) MCG/ACT inhaler, Inhale 2 puffs into the lungs every 6  (six) hours as needed for wheezing or shortness of breath., Disp: 18 g, Rfl: 2   atorvastatin (LIPITOR) 40 MG tablet, TAKE 1 TABLET BY MOUTH DAILY., Disp: 90 tablet, Rfl: 3   benzonatate (TESSALON) 100 MG capsule, Take 1 capsule (100 mg total) by mouth 3 (three) times daily as needed for cough., Disp: 30 capsule, Rfl: 1   cyclobenzaprine (FLEXERIL) 10 MG tablet, TAKE 1 TABLET BY MOUTH EVERY 8 HOURS AS NEEDED FOR MUSCLE SPASMS., Disp: 180 tablet, Rfl: 0   FLUoxetine (PROZAC) 40 MG capsule, TAKE 1 CAPSULE (40 MG TOTAL) BY MOUTH DAILY., Disp: 90 capsule, Rfl: 3   fluticasone (FLONASE) 50 MCG/ACT nasal spray, Place 2 sprays into both nostrils daily as needed., Disp: 48 g, Rfl: 3   glucose blood (ACCU-CHEK GUIDE) test strip, Check fingerstick blood sugars once a day if desired; LON 99 months; Dx E11.9, Disp: 100 each, Rfl: 0   ketorolac (TORADOL) 10 MG tablet, Take 1 tablet (10 mg total) by mouth every 6 (six) hours as needed for moderate pain or severe pain., Disp: 20 tablet, Rfl: 0   levothyroxine (SYNTHROID) 25 MCG tablet, TAKE 1 TABLET (25 MCG TOTAL) BY MOUTH DAILY., Disp: 90 tablet, Rfl: 3   losartan (COZAAR) 50 MG tablet, TAKE 1 TABLET (50 MG TOTAL) BY MOUTH DAILY., Disp: 90 tablet, Rfl: 3   metFORMIN (GLUCOPHAGE-XR) 500 MG 24 hr tablet, TAKE 1 TABLET BY MOUTH TWICE DAILY, Disp: 180 tablet, Rfl: 3   metroNIDAZOLE (FLAGYL) 500 MG tablet, Take 1 tablet (500 mg total) by mouth 2 (two) times daily., Disp: 14 tablet, Rfl: 0   PARAGARD INTRAUTERINE COPPER IU, by Intrauterine route., Disp: , Rfl:    ReliOn Ultra Thin Lancets MISC, Check fingerstick blood sugars once a day if desired; LON 99 months, E11.9, Disp: 100 each, Rfl: 0   Sod Picosulfate-Mag Ox-Cit Acd (CLENPIQ) 10-3.5-12 MG-GM -GM/160ML SOLN, Take 1 kit by mouth as directed. At 5 PM evening before procedure, drink 1 bottle of Clenpiq, hydrate, drink (5) 8 oz of water. Then do the same thing 5 hours prior to your procedure., Disp: 320 mL, Rfl:  0  Patient Active Problem List   Diagnosis Date Noted   Mild intermittent asthma 07/14/2020   Mixed hyperlipidemia 04/18/2019   Compulsive gambling 12/03/2018   Diabetes mellitus with microalbuminuria ([place]) 08/09/2018   Obesity (BMI 30.0-34.9) 08/09/2018   Tobacco abuse 05/09/2018   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   Essential hypertension, benign 05/09/2018   Anxiety 01/11/2017   Vitamin D deficiency 01/11/2017   Fatty liver 08/10/2016   Hypothyroid 04/22/2016   Chronic neck pain 02/17/2016    Past Surgical History:  Procedure Laterality Date   THYROIDECTOMY  2005  Family History  Problem Relation Age of Onset   Heart disease Father    Alcohol abuse Father    Cancer Maternal Grandmother        breast   Cancer Paternal Grandmother        breast   Breast cancer Paternal Grandmother    Dementia Mother    Thyroid disease Sister    Alcohol abuse Brother    Alcohol abuse Brother     Social History   Tobacco Use   Smoking status: Every Day    Packs/day: 0.25    Years: 20.00    Pack years: 5.00    Types: Cigarettes   Smokeless tobacco: Never  Vaping Use   Vaping Use: Never used  Substance Use Topics   Alcohol use: Not Currently    Comment: occas   Drug use: No     Allergies  Allergen Reactions   Lisinopril     cough    Health Maintenance  Topic Date Due   FOOT EXAM  12/04/2019   Pneumococcal Vaccine 50-70 Years old (2 - PCV) 07/21/2021 (Originally 03/27/2018)   OPHTHALMOLOGY EXAM  07/21/2021 (Originally 08/06/2020)   Zoster Vaccines- Shingrix (1 of 2) 07/21/2021 (Originally 01/31/1990)   COVID-19 Vaccine (3 - Pfizer risk series) 07/25/2021 (Originally 10/09/2020)   INFLUENZA VACCINE  08/25/2021 (Originally 06/21/2021)   HEMOGLOBIN A1C  12/04/2021   PAP SMEAR-Modifier  01/11/2022   MAMMOGRAM  06/11/2022   Fecal DNA (Cologuard)  06/10/2024   TETANUS/TDAP  07/10/2031   PNEUMOCOCCAL POLYSACCHARIDE VACCINE AGE 75-64 HIGH RISK  Completed   Hepatitis C Screening   Completed   HIV Screening  Completed   HPV VACCINES  Aged Out    Chart Review Today: I personally reviewed active problem list, medication list, allergies, family history, social history, health maintenance, notes from last encounter, lab results, imaging with the patient/caregiver today.   Review of Systems  Constitutional: Negative.   HENT: Negative.    Eyes: Negative.   Respiratory: Negative.    Cardiovascular: Negative.   Gastrointestinal: Negative.   Endocrine: Negative.   Genitourinary: Negative.   Musculoskeletal: Negative.   Skin: Negative.   Allergic/Immunologic: Negative.   Neurological: Negative.   Hematological: Negative.   Psychiatric/Behavioral: Negative.    All other systems reviewed and are negative.   Objective:   Vitals:   07/09/21 1040  Pulse: 95  Resp: 16  Temp: 98.2 F (36.8 C)  SpO2: 98%  Weight: 198 lb 3.2 oz (89.9 kg)  Height: 5' 4"  (1.626 m)    Body mass index is 34.02 kg/m.  Physical Exam Vitals and nursing note reviewed.  Constitutional:      General: She is not in acute distress.    Appearance: Normal appearance. She is obese. She is not ill-appearing, toxic-appearing or diaphoretic.  HENT:     Head: Normocephalic and atraumatic.     Right Ear: External ear normal.     Left Ear: External ear normal.  Eyes:     General: No scleral icterus.       Right eye: No discharge.        Left eye: No discharge.  Cardiovascular:     Rate and Rhythm: Normal rate and regular rhythm.     Pulses: Normal pulses.     Heart sounds: Normal heart sounds. No murmur heard.   No friction rub. No gallop.  Pulmonary:     Effort: Pulmonary effort is normal. No respiratory distress.     Breath sounds: Normal  breath sounds. No stridor. No wheezing, rhonchi or rales.  Abdominal:     General: Bowel sounds are normal.     Palpations: Abdomen is soft.  Musculoskeletal:     Right lower leg: No edema.     Left lower leg: No edema.  Skin:    General: Skin  is warm.     Coloration: Skin is not jaundiced or pale.     Findings: No lesion or rash.  Neurological:     Mental Status: She is alert. Mental status is at baseline.     Gait: Gait normal.  Psychiatric:        Attention and Perception: Attention and perception normal.        Mood and Affect: Mood and affect normal.        Speech: Speech normal.        Behavior: Behavior is cooperative.        Thought Content: Thought content normal.     Comments: Pt appears tired, possibly mildly depressed or flat affect    Diabetic Foot Exam - Simple   Simple Foot Form Diabetic Foot exam was performed with the following findings: Yes 07/09/2021 11:20 AM  Visual Inspection No deformities, no ulcerations, no other skin breakdown bilaterally: Yes Sensation Testing Intact to touch and monofilament testing bilaterally: Yes Pulse Check Posterior Tibialis and Dorsalis pulse intact bilaterally: Yes Comments        Assessment & Plan:   1. Diabetes mellitus with microalbuminuria ([place]) Lab Results  Component Value Date   HGBA1C 6.1 (H) 06/03/2021  Well controlled with metformin, due for eye exam and foot exam done today, with obesity pt asks about adding GLP-1 On statin and ARB - Ambulatory referral to Ophthalmology - atorvastatin (LIPITOR) 40 MG tablet; Take 1 tablet (40 mg total) by mouth at bedtime.  Dispense: 90 tablet; Refill: 3 - Dulaglutide (TRULICITY) 2.54 YH/0.6CB SOPN; Inject 0.75 mg into the skin once a week for 2 doses.  Dispense: 6 mL; Refill: 0  2. Essential hypertension, benign Stable well controlled on losartan 50 mg daily BP Readings from Last 3 Encounters:  07/09/21 126/78  06/03/21 128/82  06/10/20 134/88     Chemistry      Component Value Date/Time   NA 136 06/03/2021 0943   NA 140 04/20/2016 1216   K 4.4 06/03/2021 0943   CL 103 06/03/2021 0943   CO2 25 06/03/2021 0943   BUN 13 06/03/2021 0943   BUN 10 04/20/2016 1216   CREATININE 0.63 06/03/2021 0943       Component Value Date/Time   CALCIUM 9.4 06/03/2021 0943   ALKPHOS 67 12/10/2019 1515   AST 16 06/03/2021 0943   ALT 17 06/03/2021 0943   BILITOT 0.4 06/03/2021 0943   BILITOT <0.2 04/20/2016 1216     3. Encounter for medication monitoring All labs and meds reviewed for dx listed  4. Moderate persistent asthma, unspecified whether complicated Undercontrolled, daily sx using albuterol at least 2x a day Add maintenance inhaler  May need PFTs/spirometry - albuterol (VENTOLIN HFA) 108 (90 Base) MCG/ACT inhaler; Inhale 2 puffs into the lungs every 6 (six) hours as needed for wheezing or shortness of breath.  Dispense: 18 g; Refill: 2 - fluticasone-salmeterol (ADVAIR DISKUS) 250-50 MCG/ACT AEPB; Inhale 1 puff into the lungs in the morning and at bedtime.  Dispense: 60 each; Refill: 5  5. Mixed hyperlipidemia Compliant with meds, no SE, no myalgias, fatigue or jaundice Reviewed recent, FLP and recheck CMP Lipids at  goal, continue med: - atorvastatin (LIPITOR) 40 MG tablet; Take 1 tablet (40 mg total) by mouth at bedtime.  Dispense: 90 tablet; Refill: 3  6. Anxiety On prozac for anxiety sx, having more depressive sx - may be sleep/fatigue related - continue prozac same dose while working up other new and worsening sx  7. Current mild episode of major depressive disorder, unspecified whether recurrent (HCC) Phq positive, sleepy fatigued  Adjusting inhalers for asthma Sleep study Consider adding wellbutrin? Dose increase of levothyroxine  8. Other specified hypothyroidism Lab Results  Component Value Date   TSH 0.46 06/03/2021  Recent lab was in normal range, but sx could be consistent with chemical hypothyroid - minor dose increase and close f/up - levothyroxine (SYNTHROID) 25 MCG tablet; Take 25 mcg po qam prior to breakfast and every other day take 1.5 tabs (37.5 mcg)  Dispense: 135 tablet; Refill: 0  9. Need for Tdap vaccination  - Tdap vaccine greater than or equal to 7yo  IM  10. Need for pneumococcal vaccination  - Pneumococcal conjugate vaccine 20-valent (Prevnar 20)  11. Fatigue, unspecified type See above Dose increase levothyroxine Consider sleep study Possibly MDD sx? - levothyroxine (SYNTHROID) 25 MCG tablet; Take 25 mcg po qam prior to breakfast and every other day take 1.5 tabs (37.5 mcg)  Dispense: 135 tablet; Refill: 0  12. Excessive daytime sleepiness Score 6 -  Pt to check out insurance coverage for sleep study to r/o sleep apnea - levothyroxine (SYNTHROID) 25 MCG tablet; Take 25 mcg po qam prior to breakfast and every other day take 1.5 tabs (37.5 mcg)  Dispense: 135 tablet; Refill: 0  13. Class 1 obesity with serious comorbidity and body mass index (BMI) of 34.0 to 34.9 in adult, unspecified obesity type Work on small portions, healthy foods, exercise and add trulicity to metformin, close f/up  - Dulaglutide (TRULICITY) 5.90 BP/1.1ET SOPN; Inject 0.75 mg into the skin once a week for 2 doses.  Dispense: 6 mL; Refill: 0   Return in about 3 months (around 62/44/6950) for DM trulicity and asthma med change f/up .   Delsa Grana, PA-C 07/09/21 10:55 AM

## 2021-07-12 ENCOUNTER — Other Ambulatory Visit: Payer: Self-pay

## 2021-07-19 ENCOUNTER — Encounter: Payer: Self-pay | Admitting: Family Medicine

## 2021-07-22 ENCOUNTER — Telehealth: Payer: No Typology Code available for payment source | Admitting: Physician Assistant

## 2021-07-22 DIAGNOSIS — T2112XA Burn of first degree of abdominal wall, initial encounter: Secondary | ICD-10-CM | POA: Diagnosis not present

## 2021-07-22 NOTE — Progress Notes (Signed)
E Visit for Rash  We are sorry that you are not feeling well. Here is how we plan to help!  The area of the burn appears to be healing well. The bumps surrounding the burn appear to be either irritation from the hydrocortisone cream or possibly could even be shingles that has developed secondary to stress on the body from the burn. If you are not having much pain, more itching, I suspect it to more likely be the first.  I would avoid putting any other creams or ointments on it at this time. You can take benadryl orally as needed for itching. Cool compresses over the area can help as well.  HOME CARE:  Take cool showers and avoid direct sunlight. Apply cool compress or wet dressings. Take a bath in an oatmeal bath.  Sprinkle content of one Aveeno packet under running faucet with comfortably warm water.  Bathe for 15-20 minutes, 1-2 times daily.  Pat dry with a towel. Do not rub the rash. Use hydrocortisone cream. Take an antihistamine like Benadryl for widespread rashes that itch.  The adult dose of Benadryl is 25-50 mg by mouth 4 times daily. Caution:  This type of medication may cause sleepiness.  Do not drink alcohol, drive, or operate dangerous machinery while taking antihistamines.  Do not take these medications if you have prostate enlargement.  Read package instructions thoroughly on all medications that you take.  GET HELP RIGHT AWAY IF:  Symptoms don't go away after treatment. Severe itching that persists. If you rash spreads or swells. If you rash begins to smell. If it blisters and opens or develops a yellow-brown crust. You develop a fever. You have a sore throat. You become short of breath.  MAKE SURE YOU:  Understand these instructions. Will watch your condition. Will get help right away if you are not doing well or get worse.  Thank you for choosing an e-visit.  Your e-visit answers were reviewed by a board certified advanced clinical practitioner to complete your  personal care plan. Depending upon the condition, your plan could have included both over the counter or prescription medications.  Please review your pharmacy choice. Make sure the pharmacy is open so you can pick up prescription now. If there is a problem, you may contact your provider through [employer] and have the prescription routed to another pharmacy.  Your safety is important to [place]. If you have drug allergies check your prescription carefully.   For the next 24 hours you can use MyChart to ask questions about today's visit, request a non-urgent call back, or ask for a work or school excuse. You will get an email in the next two days asking about your experience. I hope that your e-visit has been valuable and will speed your recovery.  I provided 6 minutes of non face-to-face time during this encounter for chart review and documentation.

## 2021-08-02 ENCOUNTER — Encounter: Payer: Self-pay | Admitting: Family Medicine

## 2021-08-06 ENCOUNTER — Encounter: Payer: Self-pay | Admitting: Registered Nurse

## 2021-08-06 ENCOUNTER — Ambulatory Visit
Admission: RE | Admit: 2021-08-06 | Discharge: 2021-08-06 | Disposition: A | Discharge status: Home / Self Care | Payer: No Typology Code available for payment source | Attending: Gastroenterology | Admitting: Gastroenterology

## 2021-08-06 ENCOUNTER — Encounter
Admission: RE | Disposition: A | Discharge status: Home / Self Care | Payer: Self-pay | Source: Home / Self Care | Attending: Gastroenterology

## 2021-08-06 SURGERY — COLONOSCOPY WITH PROPOFOL
Anesthesia: General

## 2021-08-07 ENCOUNTER — Telehealth: Payer: Self-pay

## 2021-08-07 NOTE — Telephone Encounter (Signed)
this patient's procedure today had to be cancelled as she could not tolerate the prep and needs to be rescheduled. can you call her to reschedule please. thank you

## 2021-08-09 ENCOUNTER — Other Ambulatory Visit: Payer: Self-pay

## 2021-08-09 ENCOUNTER — Telehealth: Payer: Self-pay | Admitting: Gastroenterology

## 2021-08-09 ENCOUNTER — Telehealth: Payer: Self-pay

## 2021-08-09 NOTE — Telephone Encounter (Signed)
Pt. Calling to reschedule colonoscopy

## 2021-08-09 NOTE — Telephone Encounter (Signed)
Called patient to reschedule her procedure for her no answer no way to leave voicemail

## 2021-08-10 ENCOUNTER — Other Ambulatory Visit: Payer: Self-pay

## 2021-08-10 DIAGNOSIS — R809 Proteinuria, unspecified: Secondary | ICD-10-CM

## 2021-08-10 DIAGNOSIS — E1129 Type 2 diabetes mellitus with other diabetic kidney complication: Secondary | ICD-10-CM

## 2021-08-12 ENCOUNTER — Encounter: Payer: Self-pay | Admitting: Family Medicine

## 2021-08-13 NOTE — Telephone Encounter (Signed)
Called pt, no answer and VM not set up, will respond to [place]

## 2021-08-16 ENCOUNTER — Other Ambulatory Visit: Payer: Self-pay

## 2021-08-16 DIAGNOSIS — R195 Other fecal abnormalities: Secondary | ICD-10-CM

## 2021-08-16 MED ORDER — SUTAB 1479-225-188 MG PO TABS
ORAL_TABLET | ORAL | 0 refills | Status: DC
  Filled 2021-08-16: qty 24, 1d supply, fill #0

## 2021-08-17 ENCOUNTER — Other Ambulatory Visit: Payer: Self-pay

## 2021-09-01 ENCOUNTER — Other Ambulatory Visit: Payer: Self-pay

## 2021-09-08 ENCOUNTER — Ambulatory Visit
Admission: RE | Admit: 2021-09-08 | Discharge: 2021-09-08 | Disposition: A | Discharge status: Home / Self Care | Payer: No Typology Code available for payment source | Attending: Gastroenterology | Admitting: Gastroenterology

## 2021-09-08 ENCOUNTER — Ambulatory Visit: Payer: No Typology Code available for payment source | Admitting: Certified Registered"

## 2021-09-08 ENCOUNTER — Encounter
Admission: RE | Disposition: A | Discharge status: Home / Self Care | Payer: Self-pay | Source: Home / Self Care | Attending: Gastroenterology

## 2021-09-08 ENCOUNTER — Encounter: Payer: Self-pay | Admitting: Gastroenterology

## 2021-09-08 DIAGNOSIS — Z7984 Long term (current) use of oral hypoglycemic drugs: Secondary | ICD-10-CM | POA: Insufficient documentation

## 2021-09-08 DIAGNOSIS — K5730 Diverticulosis of large intestine without perforation or abscess without bleeding: Secondary | ICD-10-CM | POA: Insufficient documentation

## 2021-09-08 DIAGNOSIS — E119 Type 2 diabetes mellitus without complications: Secondary | ICD-10-CM | POA: Insufficient documentation

## 2021-09-08 DIAGNOSIS — Z79899 Other long term (current) drug therapy: Secondary | ICD-10-CM | POA: Diagnosis not present

## 2021-09-08 DIAGNOSIS — D123 Benign neoplasm of transverse colon: Secondary | ICD-10-CM | POA: Insufficient documentation

## 2021-09-08 DIAGNOSIS — D125 Benign neoplasm of sigmoid colon: Secondary | ICD-10-CM | POA: Insufficient documentation

## 2021-09-08 DIAGNOSIS — Z79890 Hormone replacement therapy (postmenopausal): Secondary | ICD-10-CM | POA: Insufficient documentation

## 2021-09-08 DIAGNOSIS — Z7985 Long-term (current) use of injectable non-insulin antidiabetic drugs: Secondary | ICD-10-CM | POA: Diagnosis not present

## 2021-09-08 DIAGNOSIS — Z888 Allergy status to other drugs, medicaments and biological substances status: Secondary | ICD-10-CM | POA: Diagnosis not present

## 2021-09-08 DIAGNOSIS — Z7951 Long term (current) use of inhaled steroids: Secondary | ICD-10-CM | POA: Diagnosis not present

## 2021-09-08 DIAGNOSIS — K635 Polyp of colon: Secondary | ICD-10-CM

## 2021-09-08 DIAGNOSIS — D122 Benign neoplasm of ascending colon: Secondary | ICD-10-CM | POA: Diagnosis not present

## 2021-09-08 DIAGNOSIS — F17210 Nicotine dependence, cigarettes, uncomplicated: Secondary | ICD-10-CM | POA: Insufficient documentation

## 2021-09-08 DIAGNOSIS — R195 Other fecal abnormalities: Secondary | ICD-10-CM

## 2021-09-08 HISTORY — PX: COLONOSCOPY WITH PROPOFOL: SHX5780

## 2021-09-08 LAB — POCT PREGNANCY, URINE: Preg Test, Ur: NEGATIVE

## 2021-09-08 LAB — GLUCOSE, CAPILLARY: Glucose-Capillary: 92 mg/dL (ref 70–99)

## 2021-09-08 SURGERY — COLONOSCOPY WITH PROPOFOL
Anesthesia: General

## 2021-09-08 MED ORDER — SODIUM CHLORIDE 0.9 % IV SOLN: INTRAVENOUS | Status: DC

## 2021-09-08 MED ORDER — PROPOFOL 10 MG/ML IV BOLUS
INTRAVENOUS | Status: AC
  Filled 2021-09-08: qty 40

## 2021-09-08 MED ORDER — PROPOFOL 10 MG/ML IV BOLUS
INTRAVENOUS | Status: AC
  Filled 2021-09-08: qty 20

## 2021-09-08 MED ORDER — PROPOFOL 10 MG/ML IV BOLUS
INTRAVENOUS | Status: DC | PRN
  Administered 2021-09-08: 40 mg via INTRAVENOUS
  Administered 2021-09-08: 50 mg via INTRAVENOUS
  Administered 2021-09-08: 90 mg via INTRAVENOUS
  Administered 2021-09-08: 20 mg via INTRAVENOUS
  Administered 2021-09-08: 40 mg via INTRAVENOUS
  Administered 2021-09-08: 30 mg via INTRAVENOUS
  Administered 2021-09-08: 20 mg via INTRAVENOUS
  Administered 2021-09-08: 70 mg via INTRAVENOUS
  Administered 2021-09-08: 30 mg via INTRAVENOUS
  Administered 2021-09-08: 40 mg via INTRAVENOUS
  Administered 2021-09-08: 30 mg via INTRAVENOUS
  Administered 2021-09-08: 20 mg via INTRAVENOUS
  Administered 2021-09-08: 30 mg via INTRAVENOUS
  Administered 2021-09-08: 60 mg via INTRAVENOUS
  Administered 2021-09-08: 30 mg via INTRAVENOUS

## 2021-09-08 NOTE — Anesthesia Postprocedure Evaluation (Signed)
Anesthesia Post Note  Patient: Julie Marquez  Procedure(s) Performed: COLONOSCOPY WITH PROPOFOL  Patient location during evaluation: Phase II Anesthesia Type: General Level of consciousness: awake and alert, awake and oriented Pain management: pain level controlled Vital Signs Assessment: post-procedure vital signs reviewed and stable Respiratory status: spontaneous breathing, nonlabored ventilation and respiratory function stable Cardiovascular status: blood pressure returned to baseline and stable Postop Assessment: no apparent nausea or vomiting Anesthetic complications: no   No notable events documented.   Last Vitals:  Vitals:   09/08/21 0914 09/08/21 0934  BP: 127/83 132/81  Pulse:    Resp:    Temp:    SpO2:      Last Pain:  Vitals:   09/08/21 0934  TempSrc:   PainSc: 0-No pain                 Phill Mutter

## 2021-09-08 NOTE — Transfer of Care (Signed)
Immediate Anesthesia Transfer of Care Note  Patient: Julie Marquez  Procedure(s) Performed: COLONOSCOPY WITH PROPOFOL  Patient Location: PACU  Anesthesia Type:General  Level of Consciousness: awake, alert  and oriented  Airway & Oxygen Therapy: Patient Spontanous Breathing  Post-op Assessment: Report given to RN and Post -op Vital signs reviewed and stable  Post vital signs: Reviewed and stable  Last Vitals:  Vitals Value Taken Time  BP    Temp    Pulse    Resp 18 09/08/21 0904  SpO2    Vitals shown include unvalidated device data.  Last Pain:  Vitals:   09/08/21 0735  TempSrc: Temporal  PainSc: 0-No pain         Complications: No notable events documented.

## 2021-09-08 NOTE — Op Note (Signed)
[facility] Gastroenterology Patient Name: Julie Marquez Procedure Date: 09/08/2021 7:27 AM MRN: [id number] Account #: [id number] Date of Birth: [birth year] Admit Type: Outpatient Age: 50 Room: [facility] ENDO ROOM 3 Gender: Female Note Status: Finalized Instrument Name: Colonoscope [id number] Procedure:             Colonoscopy Indications:           Positive Cologuard test Providers:             Darnice Comrie B. Bonna Gains MD, MD Referring MD:          Delsa Grana (Referring MD) Medicines:             Monitored Anesthesia Care Complications:         No immediate complications. Procedure:             Pre-Anesthesia Assessment:                        - ASA Grade Assessment: II - A patient with mild                         systemic disease.                        - Prior to the procedure, a History and Physical was                         performed, and patient medications, allergies and                         sensitivities were reviewed. The patient's tolerance                         of previous anesthesia was reviewed.                        - The risks and benefits of the procedure and the                         sedation options and risks were discussed with the                         patient. All questions were answered and informed                         consent was obtained.                        - Patient identification and proposed procedure were                         verified prior to the procedure by the physician, the                         nurse, the anesthesiologist, the anesthetist and the                         technician. The procedure was verified in the                         procedure  room.                        After obtaining informed consent, the colonoscope was                         passed under direct vision. Throughout the procedure,                         the patient's blood pressure, pulse, and oxygen                         saturations  were monitored continuously. The                         Colonoscope was introduced through the anus and                         advanced to the the cecum, identified by appendiceal                         orifice and ileocecal valve. The colonoscopy was                         performed with ease. The patient tolerated the                         procedure well. The quality of the bowel preparation                         was good except the ascending colon was fair and the                         cecum was fair. Findings:      The perianal and digital rectal examinations were normal.      A 12 mm polyp was found in the ascending colon. The polyp was       semi-pedunculated. The polyp was removed with a hot snare. Resection and       retrieval were complete.      Four sessile polyps were found in the transverse colon. The polyps were       5 to 7 mm in size. These polyps were removed with a cold snare.       Resection and retrieval were complete.      A 10 mm polyp was found in the sigmoid colon. The polyp was       semi-pedunculated. The polyp was removed with a hot snare. Resection and       retrieval were complete.      Two sessile polyps were found in the sigmoid colon. The polyps were 4 to       6 mm in size. These polyps were removed with a cold snare. Resection and       retrieval were complete.      A few diverticula were found in the entire colon.      The exam was otherwise without abnormality.      The retroflexed view of the distal rectum and anal verge was normal and       showed no anal or rectal abnormalities. Impression:            -  One 12 mm polyp in the ascending colon, removed with                         a hot snare. Resected and retrieved.                        - Four 5 to 7 mm polyps in the transverse colon,                         removed with a cold snare. Resected and retrieved.                        - One 10 mm polyp in the sigmoid colon, removed with a                          hot snare. Resected and retrieved.                        - Two 4 to 6 mm polyps in the sigmoid colon, removed                         with a cold snare. Resected and retrieved.                        - Diverticulosis in the entire examined colon.                        - The examination was otherwise normal.                        - The distal rectum and anal verge are normal on                         retroflexion view. Recommendation:        - Discharge patient to home (with escort).                        - Advance diet as tolerated.                        - Continue present medications.                        - Await pathology results.                        - Repeat colonoscopy in 6-12 months, with 2 day prep                         because the bowel preparation was suboptimal.                        - The findings and recommendations were discussed with                         the patient.                        - The findings and recommendations  were discussed with                         the patient's family.                        - Return to primary care physician as previously                         scheduled.                        - High fiber diet. Procedure Code(s):     --- Professional ---                        [phone], Colonoscopy, flexible; with removal of                         tumor(s), polyp(s), or other lesion(s) by snare                         technique Diagnosis Code(s):     --- Professional ---                        R19.5, Other fecal abnormalities                        K63.5, Polyp of colon CPT copyright 2019 American Medical Association. All rights reserved. The codes documented in this report are preliminary and upon coder review may  be revised to meet current compliance requirements.  Vonda Antigua, MD Margretta Sidle B. Bonna Gains MD, MD 09/08/2021 9:14:52 AM This report has been signed electronically. Number of Addenda: 0 Note  Initiated On: 09/08/2021 7:27 AM Scope Withdrawal Time: 0 hours 31 minutes 59 seconds  Total Procedure Duration: 0 hours 37 minutes 7 seconds  Estimated Blood Loss:  Estimated blood loss: none.      [facility]

## 2021-09-08 NOTE — H&P (Signed)
Vonda Antigua, MD [address] [place], [place], [place], [address] [address], [place], [place], [place], [address] Phone: [phone]  Fax: [phone]  Primary Care Physician:  Delsa Grana, PA-C   Pre-Procedure History & Physical: HPI:  Julie Marquez is a 50 y.o. female is here for a colonoscopy.   Past Medical History:  Diagnosis Date   Anxiety    Depression    Diabetes mellitus without complication ([place])    Diabetes mellitus, type II ([place])    Fatigue    Hypertension    Hypothyroidism    Panic attack    Vitamin D deficiency     Past Surgical History:  Procedure Laterality Date   THYROIDECTOMY  2005    Prior to Admission medications   Medication Sig Start Date End Date Taking? Authorizing Provider  albuterol (VENTOLIN HFA) 108 (90 Base) MCG/ACT inhaler Inhale 2 puffs into the lungs every 6 (six) hours as needed for wheezing or shortness of breath. 07/09/21  Yes Delsa Grana, PA-C  atorvastatin (LIPITOR) 40 MG tablet Take 1 tablet (40 mg total) by mouth at bedtime. 07/09/21  Yes Delsa Grana, PA-C  cyclobenzaprine (FLEXERIL) 10 MG tablet TAKE 1 TABLET BY MOUTH EVERY 8 HOURS AS NEEDED FOR MUSCLE SPASMS. 03/04/21  Yes Delsa Grana, PA-C  Dulaglutide (TRULICITY) 6.76 PP/5.0DT SOPN Inject 0.75 mg into the skin once a week for 2 doses. 07/09/21 10/04/21 Yes Delsa Grana, PA-C  FLUoxetine (PROZAC) 40 MG capsule TAKE 1 CAPSULE (40 MG TOTAL) BY MOUTH DAILY. 06/17/21 06/17/22 Yes Kathrine Haddock, NP  fluticasone (FLONASE) 50 MCG/ACT nasal spray Place 2 sprays into both nostrils daily as needed. 03/04/21  Yes Delsa Grana, PA-C  fluticasone-salmeterol (ADVAIR DISKUS) 250-50 MCG/ACT AEPB Inhale 1 puff into the lungs in the morning and at bedtime. 07/09/21  Yes Delsa Grana, PA-C  levothyroxine (SYNTHROID) 25 MCG tablet Take 25 mcg po qam prior to breakfast and every other day take 1.5 tabs (37.5 mcg) 07/09/21  Yes Delsa Grana, PA-C  losartan (COZAAR) 50 MG tablet TAKE 1  TABLET (50 MG TOTAL) BY MOUTH DAILY. 06/10/21 06/10/22 Yes Delsa Grana, PA-C  metFORMIN (GLUCOPHAGE-XR) 500 MG 24 hr tablet TAKE 1 TABLET BY MOUTH TWICE DAILY 06/10/21 06/10/22 Yes Delsa Grana, PA-C  glucose blood (ACCU-CHEK GUIDE) test strip Check fingerstick blood sugars once a day if desired; LON 99 months; Dx E11.9 05/15/18   Lada, Satira Anis, MD  ketorolac (TORADOL) 10 MG tablet Take 1 tablet (10 mg total) by mouth every 6 (six) hours as needed for moderate pain or severe pain. Patient not taking: Reported on 09/08/2021 06/10/20   Coral Spikes, DO  PARAGARD INTRAUTERINE COPPER IU by Intrauterine route.    [provider]  ReliOn Ultra Thin Lancets MISC Check fingerstick blood sugars once a day if desired; LON 99 months, E11.9 05/15/18   Lada, Satira Anis, MD  Sod Picosulfate-Mag Ox-Cit Acd (CLENPIQ) 10-3.5-12 MG-GM -GM/160ML SOLN Take 1 kit by mouth as directed. At 5 PM evening before procedure, drink 1 bottle of Clenpiq, hydrate, drink (5) 8 oz of water. Then do the same thing 5 hours prior to your procedure. 07/01/21   Virgel Manifold, MD  Sodium Sulfate-Mag Sulfate-KCl (SUTAB) [id number] MG TABS Per instructions given 08/16/21   Virgel Manifold, MD    Allergies as of 08/16/2021 - Review Complete 07/22/2021  Allergen Reaction Noted   Lisinopril  05/09/2018    Family History  Problem Relation Age of Onset   Heart disease Father  Alcohol abuse Father    Cancer Maternal Grandmother        breast   Cancer Paternal Grandmother        breast   Breast cancer Paternal Grandmother    Dementia Mother    Thyroid disease Sister    Alcohol abuse Brother    Alcohol abuse Brother     Social History   Socioeconomic History   Marital status: Married    Spouse name: robert   Number of children: 1   Years of education: Not on file   Highest education level: Associate degree: occupational, Hotel manager, or vocational program  Occupational History   Not on file  Tobacco Use    Smoking status: Every Day    Packs/day: 0.25    Years: 20.00    Pack years: 5.00    Types: Cigarettes   Smokeless tobacco: Never  Vaping Use   Vaping Use: Never used  Substance and Sexual Activity   Alcohol use: Not Currently    Comment: occas   Drug use: No   Sexual activity: Yes    Birth control/protection: I.U.D.    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   Social Determinants of Health   Financial Resource Strain: Low Risk    Difficulty of Paying Living Expenses: Not hard at all  Food Insecurity: No Food Insecurity   Worried About Charity fundraiser in the Last Year: Never true   Arboriculturist in the Last Year: Never true  Transportation Needs: No Transportation Needs   Lack of Transportation (Medical): No   Lack of Transportation (Non-Medical): No  Physical Activity: Insufficiently Active   Days of Exercise per Week: 3 days   Minutes of Exercise per Session: 30 min  Stress: No Stress Concern Present   Feeling of Stress : Only a little  Social Connections: Engineer, building services of Communication with Friends and Family: More than three times a week   Frequency of Social Gatherings with Friends and Family: More than three times a week   Attends Religious Services: 1 to 4 times per year   Active Member of [employer] or Organizations: Yes   Attends Music therapist: More than 4 times per year   Marital Status: Married  Human resources officer Violence: Not At Risk   Fear of Current or Ex-Partner: No   Emotionally Abused: No   Physically Abused: No   Sexually Abused: No    Review of Systems: See HPI, otherwise negative ROS  Physical Exam: Constitutional: General:   Alert,  Well-developed, well-nourished, pleasant and cooperative in NAD BP (!) 148/88   Pulse 86   Temp (!) 96.8 F (36 C) (Temporal)   Resp 18   Ht _0  (1.6 m)   Wt 88.5 kg   SpO2 98%   BMI 34.54  kg/m   Head: Normocephalic, atraumatic.   Eyes:  Sclera clear, no icterus.   Conjunctiva pink.   Mouth:  No deformity or lesions, oropharynx pink & moist.  Neck:  Supple, trachea midline  Respiratory: Normal respiratory effort  Gastrointestinal:  Soft, non-tender and non-distended without masses, hepatosplenomegaly or hernias noted.  No guarding or rebound tenderness.     Cardiac: No clubbing or edema.  No cyanosis. Normal posterior tibial pedal pulses noted.  Lymphatic:  No significant cervical adenopathy.  Psych:  Alert and cooperative. Normal mood and  affect.  Musculoskeletal:   Symmetrical without gross deformities. 5/5 Lower extremity strength bilaterally.  Skin: Warm. Intact without significant lesions or rashes. No jaundice.  Neurologic:  Face symmetrical, tongue midline, Normal sensation to touch;  grossly normal neurologically.  Psych:  Alert and oriented x3, Alert and cooperative. Normal mood and affect.  Impression/Plan: Julie Marquez is here for a colonoscopy to be performed for positive cologuard  Risks, benefits, limitations, and alternatives regarding  colonoscopy have been reviewed with the patient.  Questions have been answered.  All parties agreeable.   Virgel Manifold, MD  09/08/2021, 8:13 AM

## 2021-09-08 NOTE — Anesthesia Preprocedure Evaluation (Signed)
Anesthesia Evaluation  Patient identified by MRN, date of birth, ID band Patient awake    Reviewed: Allergy & Precautions, NPO status , Patient's Chart, lab work & pertinent test results  Airway Mallampati: II  TM Distance: >3 FB Neck ROM: Full    Dental no notable dental hx.    Pulmonary asthma , Current Smoker and Patient abstained from smoking.,    Pulmonary exam normal        Cardiovascular hypertension, Pt. on medications negative cardio ROS Normal cardiovascular exam     Neuro/Psych PSYCHIATRIC DISORDERS Anxiety Depression negative neurological ROS     GI/Hepatic negative GI ROS, Neg liver ROS, Bowel prep,  Endo/Other  diabetes, Well Controlled, Type 2, Oral Hypoglycemic AgentsHypothyroidism   Renal/GU negative Renal ROS  negative genitourinary   Musculoskeletal negative musculoskeletal ROS (+)   Abdominal   Peds negative pediatric ROS (+)  Hematology negative hematology ROS (+)   Anesthesia Other Findings Anxiety    Depression    Diabetes mellitus, type II (HCC)    Fatigue    Hypertension    Hypothyroidism    Panic attack    Vitamin D deficiency       Reproductive/Obstetrics negative OB ROS                             Anesthesia Physical Anesthesia Plan  ASA: 3  Anesthesia Plan: General   Post-op Pain Management:    Induction: Intravenous  PONV Risk Score and Plan: 2 and Propofol infusion and TIVA  Airway Management Planned: Natural Airway and Nasal Cannula  Additional Equipment:   Intra-op Plan:   Post-operative Plan:   Informed Consent: I have reviewed the patients History and Physical, chart, labs and discussed the procedure including the risks, benefits and alternatives for the proposed anesthesia with the patient or authorized representative who has indicated his/her understanding and acceptance.       Plan Discussed with: CRNA, Anesthesiologist and  Surgeon  Anesthesia Plan Comments:         Anesthesia Quick Evaluation

## 2021-09-09 ENCOUNTER — Encounter: Payer: Self-pay | Admitting: Gastroenterology

## 2021-09-09 LAB — SURGICAL PATHOLOGY

## 2021-09-22 ENCOUNTER — Other Ambulatory Visit: Payer: Self-pay

## 2021-09-26 ENCOUNTER — Encounter: Payer: Self-pay | Admitting: Family Medicine

## 2021-09-27 ENCOUNTER — Telehealth (INDEPENDENT_AMBULATORY_CARE_PROVIDER_SITE_OTHER): Payer: No Typology Code available for payment source | Admitting: Family Medicine

## 2021-09-27 ENCOUNTER — Encounter: Payer: Self-pay | Admitting: Family Medicine

## 2021-09-27 ENCOUNTER — Other Ambulatory Visit: Payer: Self-pay

## 2021-09-27 VITALS — BP 139/89 | HR 78 | Temp 98.9°F | Ht 64.0 in | Wt 195.0 lb

## 2021-09-27 DIAGNOSIS — J069 Acute upper respiratory infection, unspecified: Secondary | ICD-10-CM

## 2021-09-27 MED ORDER — BENZONATATE 200 MG PO CAPS
200.0000 mg | ORAL_CAPSULE | Freq: Two times a day (BID) | ORAL | 0 refills | Status: DC | PRN
  Filled 2021-09-27: qty 20, 10d supply, fill #0

## 2021-09-27 MED ORDER — METHYLPREDNISOLONE 4 MG PO TBPK
ORAL_TABLET | ORAL | 0 refills | Status: DC
  Filled 2021-09-27: qty 21, 6d supply, fill #0

## 2021-09-27 NOTE — Progress Notes (Signed)
Virtual Visit via Video Note  I connected with Julie Marquez on 09/27/21 at 11:20 AM EST by a video enabled telemedicine application and verified that I am speaking with the correct person using two identifiers.  Location: Patient: work Provider: [facility]   I discussed the limitations of evaluation and management by telemedicine and the availability of in person appointments. The patient expressed understanding and agreed to proceed.  History of Present Illness:  UPPER RESPIRATORY TRACT INFECTION - cough, congestion since colonoscopy few weeks ago - COVID negative x2.   Worst symptom: Fever: no Cough: yes, nonproductive Shortness of breath:  at night yes, from coughing Chest pain: yes, with cough Chest congestion: yes Nasal congestion: no Runny nose: yes Sore throat:  resolved Sinus pressure: no Ear pain: no  Ear pressure: no  Vomiting:  a few nights ago, none since Rash: no Context: better Recurrent sinusitis: no Relief with OTC cold/cough medications: yes  Treatments attempted: mucinex    Observations/Objective:  Well appearing, in NAD. Speaks in full sentences, no respiratory distress.  Assessment and Plan:  Viral URI Doing well with mild sx. COVID negative. Will provide medrol dose pack given significant coughing, likely upper airway inflammation. Reviewed OTC symptom relief and emergency precautions.      I discussed the assessment and treatment plan with the patient. The patient was provided an opportunity to ask questions and all were answered. The patient agreed with the plan and demonstrated an understanding of the instructions.   The patient was advised to call back or seek an in-person evaluation if the symptoms worsen or if the condition fails to improve as anticipated.  I provided 6 minutes of non-face-to-face time during this encounter.   Myles Gip, DO

## 2021-09-27 NOTE — Patient Instructions (Signed)
It was great to see you! You likely have a viral URI causing your symptoms.   Our plans for today:  - Take the steroid and cough medicine as prescribed. - Come back to see [place] if your symptoms are persistent despite treatment or if your symptoms change or worsen.   Take care and seek immediate care sooner if you develop any concerns.   Dr. Ky Barban

## 2021-10-08 ENCOUNTER — Ambulatory Visit: Payer: No Typology Code available for payment source | Admitting: Family Medicine

## 2021-10-26 ENCOUNTER — Other Ambulatory Visit: Payer: Self-pay

## 2021-10-26 ENCOUNTER — Other Ambulatory Visit: Payer: Self-pay | Admitting: Family Medicine

## 2021-10-26 DIAGNOSIS — M62838 Other muscle spasm: Secondary | ICD-10-CM

## 2021-11-03 IMAGING — MG MM DIGITAL SCREENING BILAT W/ TOMO AND CAD
6 of 10 series · 6 of 30 positions shown · non-contrast
Comparison: Previous exam(s).

CLINICAL DATA: Screening.

EXAM:
DIGITAL SCREENING BILATERAL MAMMOGRAM WITH TOMOSYNTHESIS AND CAD
TECHNIQUE: Bilateral screening digital craniocaudal and mediolateral oblique
mammograms were obtained. Bilateral screening digital breast
tomosynthesis was performed. The images were evaluated with
computer-aided detection.

[R MLO synth-2D (1 of 2)]
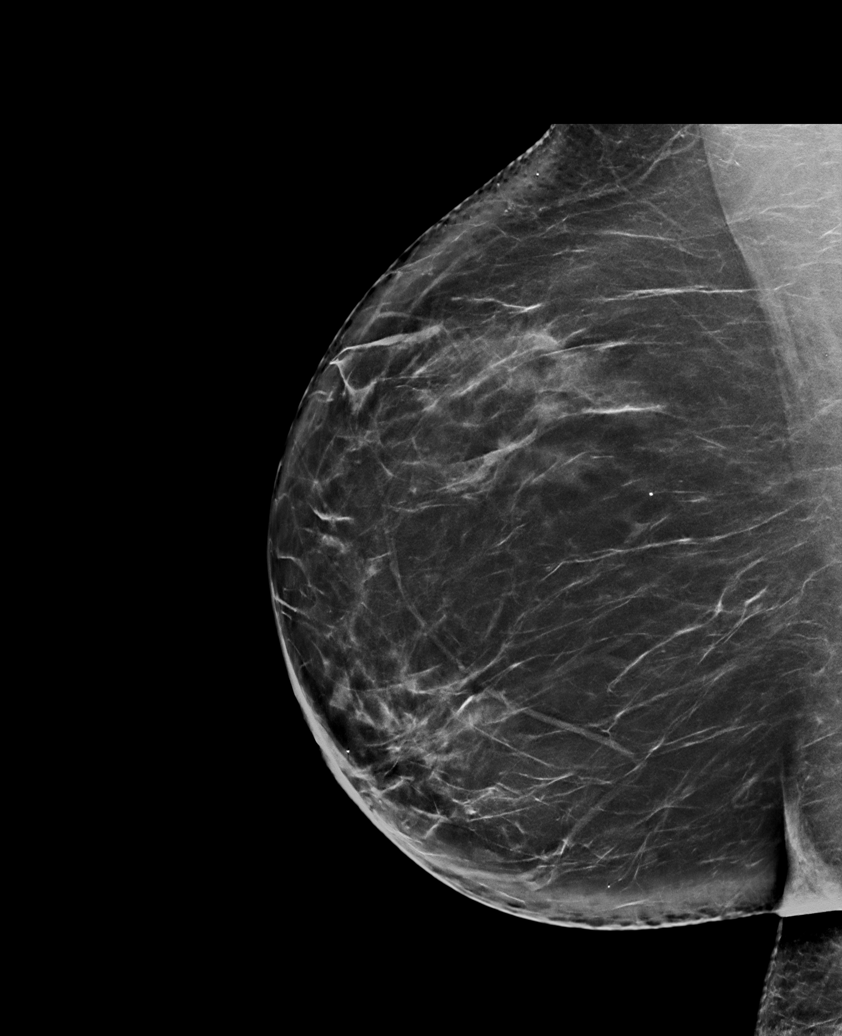

[R CC synth-2D]
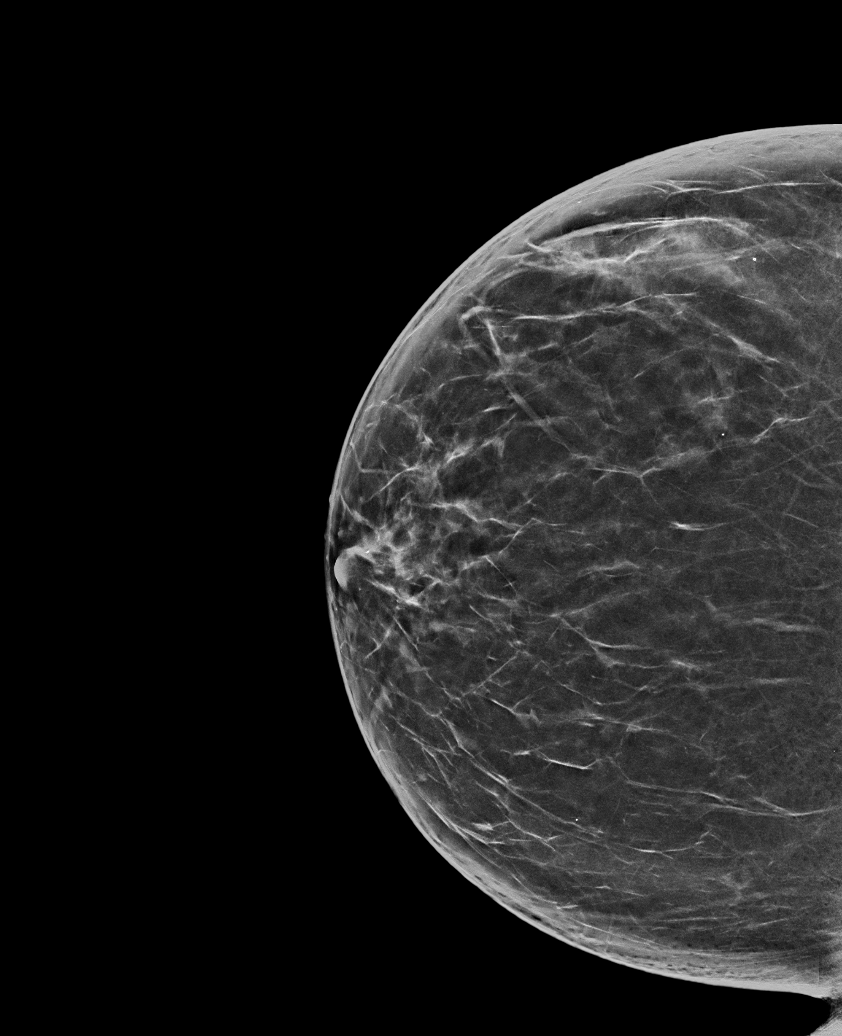

[L CC synth-2D]
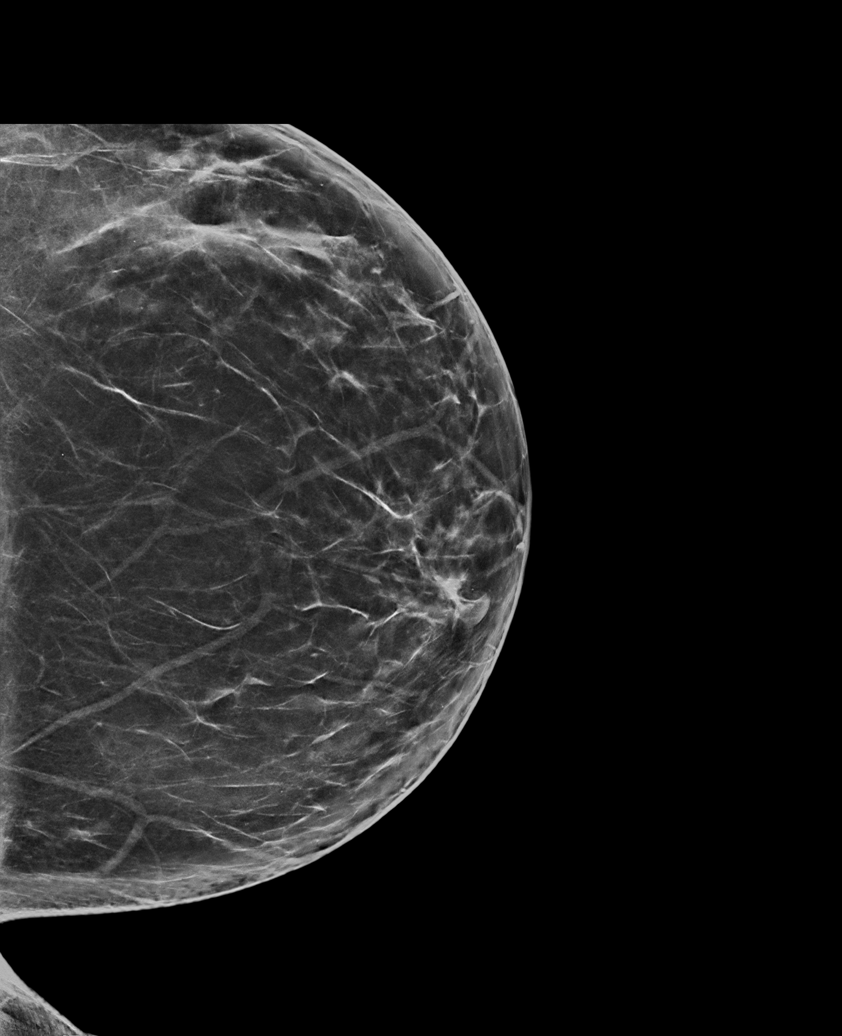

[L MLO synth-2D]
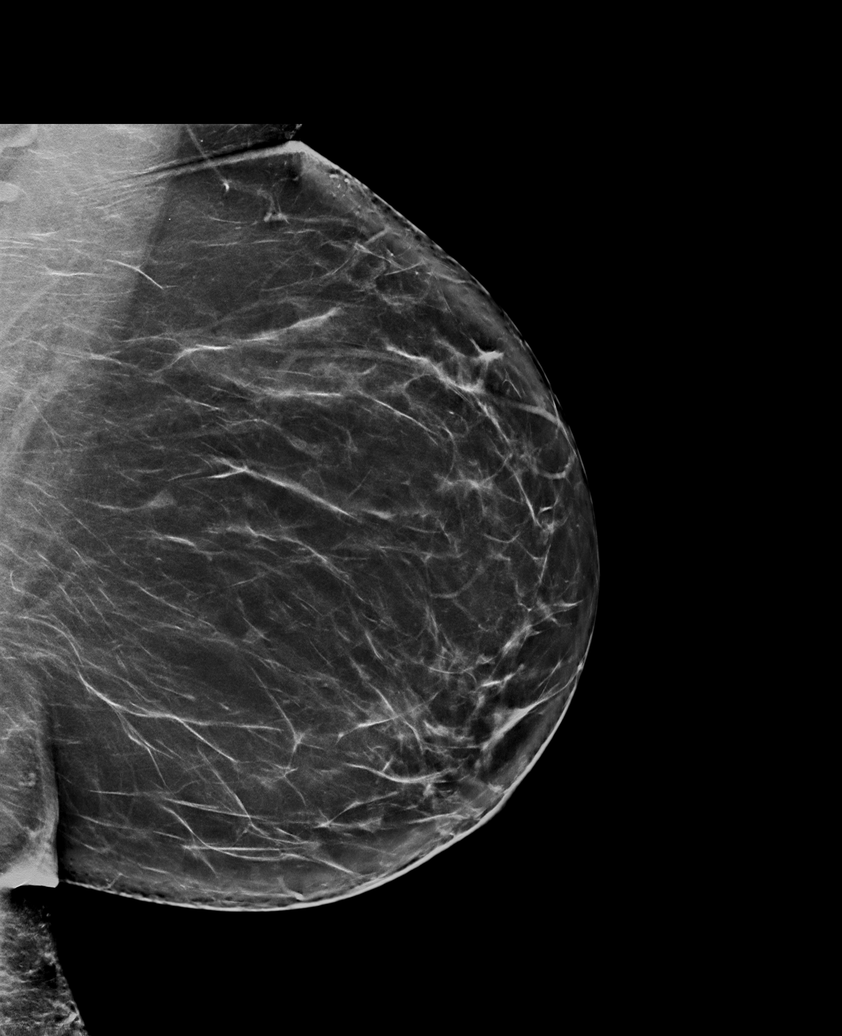

[R MLO synth-2D (2 of 2)]
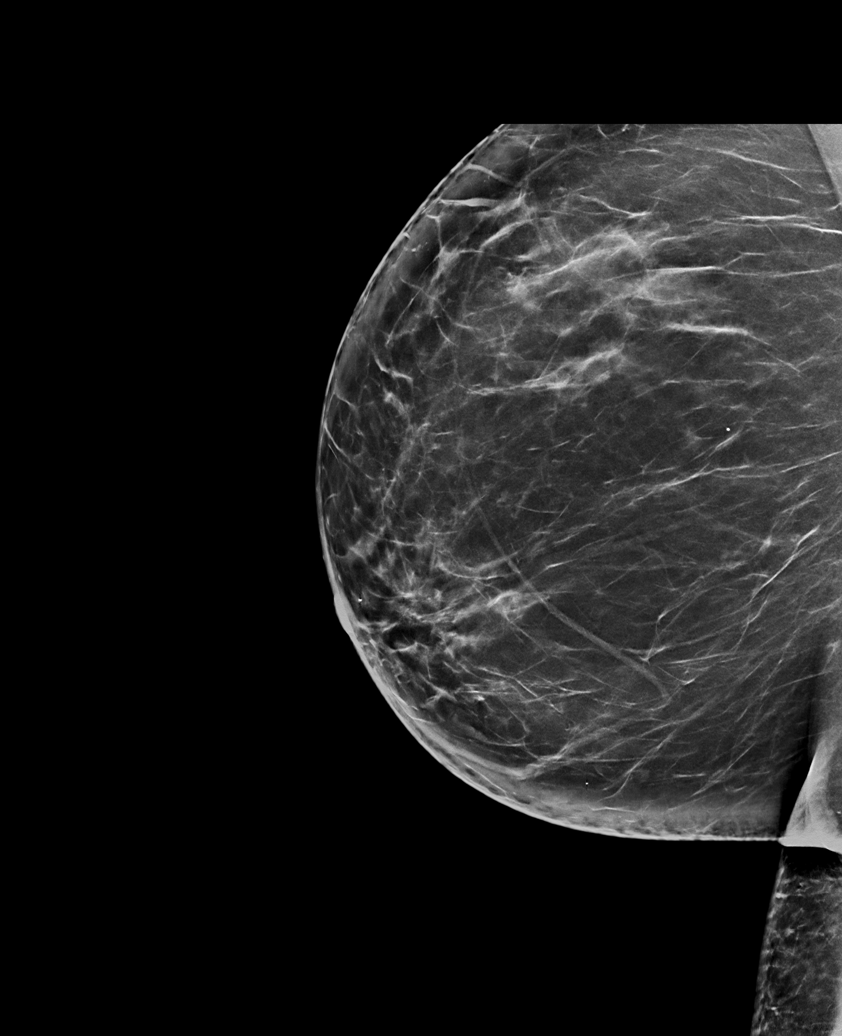

[L MLO tomo · tomo slice 47/93.0]
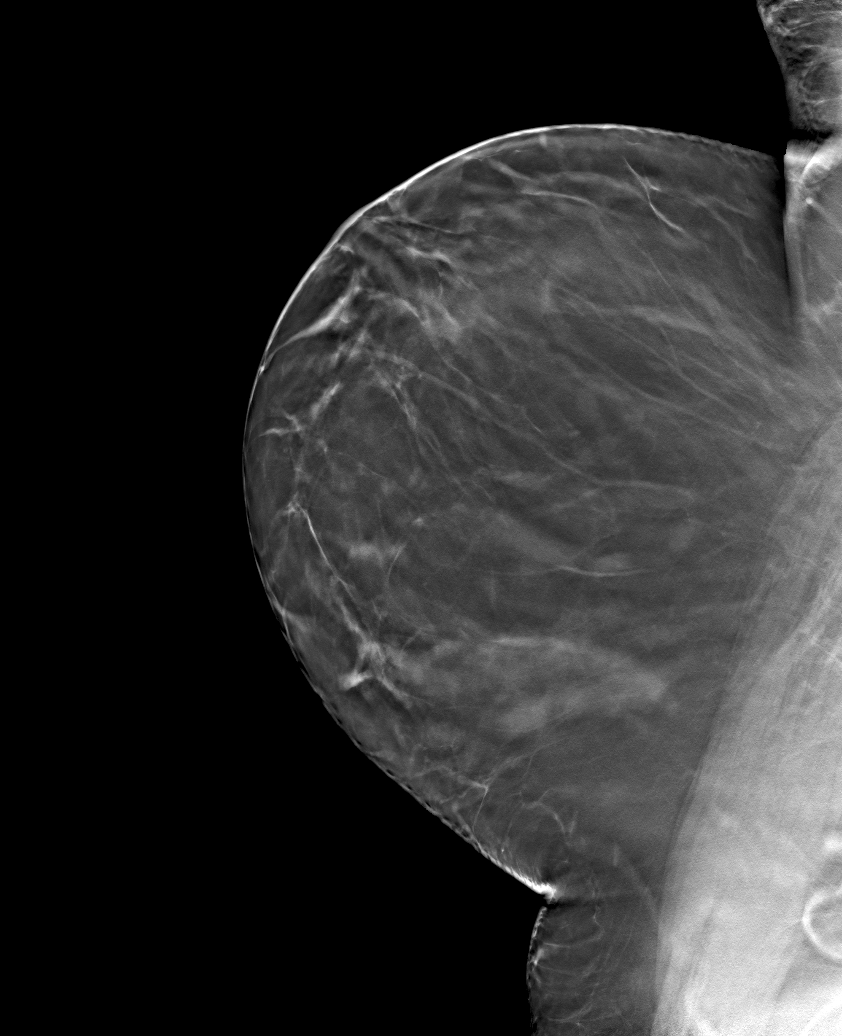

[6 of 30 positions shown; findings below may reference images not displayed]

ACR Breast Density Category b: There are scattered areas of
fibroglandular density.
FINDINGS: There are no findings suspicious for malignancy.
IMPRESSION: No mammographic evidence of malignancy. A result letter of this
screening mammogram will be mailed directly to the patient.

RECOMMENDATION:
Screening mammogram in one year. (Code:51-O-LD2)

BI-RADS CATEGORY  1: Negative.

## 2021-11-26 ENCOUNTER — Other Ambulatory Visit: Payer: Self-pay

## 2021-12-14 ENCOUNTER — Other Ambulatory Visit: Payer: Self-pay

## 2021-12-31 ENCOUNTER — Other Ambulatory Visit: Payer: Self-pay

## 2022-02-14 ENCOUNTER — Ambulatory Visit (INDEPENDENT_AMBULATORY_CARE_PROVIDER_SITE_OTHER): Payer: No Typology Code available for payment source | Admitting: Family Medicine

## 2022-02-14 ENCOUNTER — Telehealth: Payer: Self-pay | Admitting: Family Medicine

## 2022-02-14 ENCOUNTER — Encounter: Payer: Self-pay | Admitting: Family Medicine

## 2022-02-14 ENCOUNTER — Other Ambulatory Visit: Payer: Self-pay

## 2022-02-14 VITALS — BP 118/82 | HR 97 | Temp 98.0°F | Resp 16 | Ht 64.0 in | Wt 200.3 lb

## 2022-02-14 DIAGNOSIS — E782 Mixed hyperlipidemia: Secondary | ICD-10-CM

## 2022-02-14 DIAGNOSIS — E038 Other specified hypothyroidism: Secondary | ICD-10-CM

## 2022-02-14 DIAGNOSIS — E1129 Type 2 diabetes mellitus with other diabetic kidney complication: Secondary | ICD-10-CM

## 2022-02-14 DIAGNOSIS — Z720 Tobacco use: Secondary | ICD-10-CM

## 2022-02-14 DIAGNOSIS — Z6834 Body mass index (BMI) 34.0-34.9, adult: Secondary | ICD-10-CM

## 2022-02-14 DIAGNOSIS — I10 Essential (primary) hypertension: Secondary | ICD-10-CM | POA: Diagnosis not present

## 2022-02-14 DIAGNOSIS — E669 Obesity, unspecified: Secondary | ICD-10-CM

## 2022-02-14 DIAGNOSIS — J4520 Mild intermittent asthma, uncomplicated: Secondary | ICD-10-CM

## 2022-02-14 DIAGNOSIS — R809 Proteinuria, unspecified: Secondary | ICD-10-CM

## 2022-02-14 MED ORDER — SEMAGLUTIDE (1 MG/DOSE) 4 MG/3ML ~~LOC~~ SOPN
0.5000 mg | PEN_INJECTOR | SUBCUTANEOUS | 1 refills | Status: DC
  Filled 2022-02-14: qty 3, 56d supply, fill #0

## 2022-02-14 MED ORDER — OZEMPIC (0.25 OR 0.5 MG/DOSE) 2 MG/3ML ~~LOC~~ SOPN: 0.2500 mg | PEN_INJECTOR | SUBCUTANEOUS | 0 refills | Status: DC

## 2022-02-14 MED ORDER — METFORMIN HCL ER 500 MG PO TB24: 1000.0000 mg | ORAL_TABLET | Freq: Every day | ORAL | 3 refills | Status: DC

## 2022-02-14 MED ORDER — FREESTYLE FREEDOM LITE W/DEVICE KIT
PACK | 0 refills | Status: DC
  Filled 2022-02-14: qty 1, 1d supply, fill #0

## 2022-02-14 MED ORDER — FREESTYLE LITE TEST VI STRP
ORAL_STRIP | 0 refills | Status: DC
  Filled 2022-02-14: qty 100, 90d supply, fill #0

## 2022-02-14 MED ORDER — OZEMPIC (0.25 OR 0.5 MG/DOSE) 2 MG/1.5ML ~~LOC~~ SOPN
0.5000 mg | PEN_INJECTOR | SUBCUTANEOUS | 1 refills | Status: DC
  Filled 2022-02-14: qty 1.5, 28d supply, fill #0

## 2022-02-14 MED ORDER — FREESTYLE LANCETS MISC
0 refills | Status: DC
  Filled 2022-02-14: qty 100, 90d supply, fill #0

## 2022-02-14 NOTE — Telephone Encounter (Signed)
I do not see a receipt confirmation that pharmacy received it Dr.Rumball. please advice ?

## 2022-02-14 NOTE — Assessment & Plan Note (Signed)
Doing well on current regimen, no changes made today. 

## 2022-02-14 NOTE — Assessment & Plan Note (Addendum)
Recheck a1c today. Interested in starting ozempic to help with weight control. Sample provided today. 0.'5mg'$  dose sent to pharmacy to start next month. Rx blood glucose meter. F/u in 4 weeks.  ?

## 2022-02-14 NOTE — Assessment & Plan Note (Signed)
Has since switched to vaping with efforts to continually cut back. Congratulated efforts.  ?

## 2022-02-14 NOTE — Telephone Encounter (Signed)
[place] employee pharmacy called and stated that the office Sent in '1MG'$  Ozempic pens RX but says take 0.5 pens / they either need a new RX for 0.5 with correct directions or '1MG'$  pens with correct directions / please advise  ?

## 2022-02-14 NOTE — Assessment & Plan Note (Signed)
Recheck labs 

## 2022-02-14 NOTE — Assessment & Plan Note (Signed)
Doing well, better since quitting smoking. Continue prn albuterol.  ?

## 2022-02-14 NOTE — Progress Notes (Signed)
? ? ?  SUBJECTIVE:  ? ?CHIEF COMPLAINT / HPI:  ? ?Diabetes, Type 2 ?- Last A1c 6.1 05/2021 ?- Medications: metformin. Previously on trulicity ?- Compliance: good ?- Checking BG at home: no ?- Exercise: walking, biking ?- Eye exam: [facility] ?- Foot exam: UTD ?- Statin: yes ?- PNA vaccine: UTD ?- Denies symptoms of hypoglycemia, polyuria, polydipsia, numbness extremities, foot ulcers/trauma ? ?Hypertension: ?- Medications: losartan ?- Compliance: good ?- Checking BP at home: yes ?- Denies any SOB, CP, vision changes, LE edema, medication SEs, or symptoms of hypotension ? ?Hypothyroidism ?- Medications: Synthroid 58mg ?- Current symptoms:  slugglish, mental fog ?- Denies diarrhea, nervousness, and palpitations ? ?Asthma ?- Medications: albuterol PRN, advair ?- Taking: not taking advair. Using albuterol few times per month ?- Common triggers: respiratory viruses ?- ED visits/hospitalization in the last 6 months: no ?- Current symptoms: none ?- better since quit smoking. Currently vaping.  ? ? ? ?OBJECTIVE:  ? ?BP 118/82   Pulse 97   Temp 98 ?F (36.7 ?C)   Resp 16   Ht '5\' 4"'$  (1.626 m)   Wt 200 lb 4.8 oz (90.9 kg)   SpO2 98%   BMI 34.38 kg/m?   ?Gen: well appearing, in NAD. Obese. ?Card: RRR ?Lungs: CTAB ?Ext: WWP, no edema ? ? ?ASSESSMENT/PLAN:  ? ?Essential hypertension, benign ?Doing well on current regimen, no changes made today. ? ?Mild intermittent asthma ?Doing well, better since quitting smoking. Continue prn albuterol.  ? ?Diabetes mellitus with microalbuminuria ([place] ?Recheck a1c today. Interested in starting ozempic to help with weight control. Sample provided today. 0.'5mg'$  dose sent to pharmacy to start next month. Rx blood glucose meter. F/u in 4 weeks.  ? ?Hypothyroid ?Recheck labs and adjust as indicated. ? ?Mixed hyperlipidemia ?Recheck labs. ? ?Tobacco abuse ?Has since switched to vaping with efforts to continually cut back. Congratulated efforts.  ?  ? ? ?AMyles Gip DO ?

## 2022-02-14 NOTE — Assessment & Plan Note (Signed)
Recheck labs and adjust as indicated.  

## 2022-02-15 ENCOUNTER — Other Ambulatory Visit: Payer: Self-pay

## 2022-02-15 LAB — BASIC METABOLIC PANEL
BUN: 16 mg/dL (ref 7–25)
CO2: 26 mmol/L (ref 20–32)
Calcium: 9.5 mg/dL (ref 8.6–10.4)
Chloride: 102 mmol/L (ref 98–110)
Creat: 0.67 mg/dL (ref 0.50–1.03)
Glucose, Bld: 70 mg/dL (ref 65–99)
Potassium: 4.5 mmol/L (ref 3.5–5.3)
Sodium: 137 mmol/L (ref 135–146)

## 2022-02-15 LAB — LIPID PANEL
Cholesterol: 153 mg/dL (ref <200)
HDL: 52 mg/dL (ref >=50)
LDL Cholesterol (Calc): 66 mg/dL (calc)
Non-HDL Cholesterol (Calc): 101 mg/dL (calc) (ref <130)
Total CHOL/HDL Ratio: 2.9 (calc) (ref <5.0)
Triglycerides: 293 mg/dL — ABNORMAL HIGH (ref <150)

## 2022-02-15 LAB — HEMOGLOBIN A1C
Hgb A1c MFr Bld: 6.3 % of total Hgb — ABNORMAL HIGH (ref <5.7)
Mean Plasma Glucose: 134 mg/dL
eAG (mmol/L): 7.4 mmol/L

## 2022-02-15 LAB — TSH: TSH: 0.03 mIU/L — ABNORMAL LOW

## 2022-02-15 NOTE — Telephone Encounter (Signed)
Called pharmacy spoke to [place] and stated they have received the fixed rx and is currently being filled. ?

## 2022-02-21 ENCOUNTER — Other Ambulatory Visit: Payer: Self-pay

## 2022-02-21 ENCOUNTER — Encounter: Payer: Self-pay | Admitting: Family Medicine

## 2022-02-21 ENCOUNTER — Other Ambulatory Visit: Payer: Self-pay | Admitting: Family Medicine

## 2022-02-21 DIAGNOSIS — E038 Other specified hypothyroidism: Secondary | ICD-10-CM

## 2022-02-21 DIAGNOSIS — R5383 Other fatigue: Secondary | ICD-10-CM

## 2022-02-21 DIAGNOSIS — G4719 Other hypersomnia: Secondary | ICD-10-CM

## 2022-02-21 DIAGNOSIS — E1129 Type 2 diabetes mellitus with other diabetic kidney complication: Secondary | ICD-10-CM

## 2022-02-22 ENCOUNTER — Other Ambulatory Visit: Payer: Self-pay

## 2022-02-22 MED FILL — Metformin HCl Tab ER 24HR 500 MG: ORAL | 90 days supply | Qty: 180 | Fill #0 | Status: AC

## 2022-02-22 MED FILL — Levothyroxine Sodium Tab 25 MCG: ORAL | 90 days supply | Qty: 135 | Fill #0 | Status: AC

## 2022-02-22 NOTE — Telephone Encounter (Signed)
Requested Prescriptions  ?Pending Prescriptions Disp Refills  ?? metFORMIN (GLUCOPHAGE-XR) 500 MG 24 hr tablet 180 tablet 3  ?  Sig: TAKE 1 TABLET BY MOUTH TWICE DAILY  ?  ? Endocrinology:  Diabetes - Biguanides Failed - 02/21/2022 12:17 PM  ?  ?  Failed - B12 Level in normal range and within 720 days  ?  Vitamin B-12  ?Date Value Ref Range Status  ?12/03/2018 493 200 - 1,100 pg/mL Final  ?   ?  ?  Passed - Cr in normal range and within 360 days  ?  Creat  ?Date Value Ref Range Status  ?02/14/2022 0.67 0.50 - 1.03 mg/dL Final  ? ?Creatinine, Urine  ?Date Value Ref Range Status  ?12/03/2018 144 20 - 275 mg/dL Final  ?   ?  ?  Passed - HBA1C is between 0 and 7.9 and within 180 days  ?  Hgb A1c MFr Bld  ?Date Value Ref Range Status  ?02/14/2022 6.3 (H) <5.7 % of total Hgb Final  ?  Comment:  ?  For someone without known diabetes, a hemoglobin  ?A1c value between 5.7% and 6.4% is consistent with ?prediabetes and should be confirmed with a  ?follow-up test. ?. ?For someone with known diabetes, a value <7% ?indicates that their diabetes is well controlled. A1c ?targets should be individualized based on duration of ?diabetes, age, comorbid conditions, and other ?considerations. ?. ?This assay result is consistent with an increased risk ?of diabetes. ?. ?Currently, no consensus exists regarding use of ?hemoglobin A1c for diagnosis of diabetes for children. ?. ?  ?   ?  ?  Passed - eGFR in normal range and within 360 days  ?  GFR, Est African American  ?Date Value Ref Range Status  ?05/27/2020 121 > OR = 60 mL/min/1.50m Final  ? ?GFR, Est Non African American  ?Date Value Ref Range Status  ?05/27/2020 105 > OR = 60 mL/min/1.761mFinal  ? ?eGFR  ?Date Value Ref Range Status  ?06/03/2021 108 > OR = 60 mL/min/1.7354minal  ?  Comment:  ?  The eGFR is based on the CKD-EPI 2021 equation. To calculate  ?the new eGFR from a previous Creatinine or Cystatin C ?result, go to  https://www.kidney.org/professionals/ ?kdoqi/gfr%5Fcalculator ?  ?   ?  ?  Passed - Valid encounter within last 6 months  ?  Recent Outpatient Visits   ?      ? 1 week ago [place] [facility]  ? 4 months ago [place]al URI  ? [facility]  ? 7 months [place]uria [facility]  ? 8 months ago [facility]ine general medical [facility] at a health care facility  [facility]  ? 1 year ago Upper respiratory tract infection due to COVID-19 virus  ? [facility] [place]  ?  ? ?  ?  ?  Passed - CBC within normal limits and completed in the last 12 months  ?  WBC  ?Date Value Ref Range Status  ?06/03/2021 10.1 3.8 - 10.8 Thousand/uL Final  ? ?RBC  ?Date Value Ref Range Status  ?06/03/2021 4.90 3.80 - 5.10 Million/uL Final  ? ?Hemoglobin  ?Date Value Ref Range Status  ?06/03/2021 13.0 11.7 - 15.5 g/dL Final  ?04/20/2016 13.1 11.1 -  15.9 g/dL Final  ? ?HCT  ?Date Value Ref Range Status  ?06/03/2021 40.4 35.0 - 45.0 % Final  ? ?Hematocrit  ?Date Value Ref Range Status  ?04/20/2016 40.0 34.0 - 46.6 % Final  ? ?MCHC  ?Date Value Ref Range Status  ?06/03/2021 32.2 32.0 - 36.0 g/dL Final  ? ?MCH  ?Date Value Ref Range Status  ?06/03/2021 26.5 (L) 27.0 - 33.0 pg Final  ? ?MCV  ?Date Value Ref Range Status  ?06/03/2021 82.4 80.0 - 100.0 fL Final  ?04/20/2016 78 (L) 79 - 97 fL Final  ? ?No results found for: PLTCOUNTKUC, LABPLAT, [place] ?RDW  ?Date Value Ref Range Status  ?06/03/2021 13.7 11.0 - 15.0 % Final  ?04/20/2016 16.4 (H) 12.3 - 15.4 % Final  ? ?  ?  ?  ?? levothyroxine (SYNTHROID) 25 MCG tablet 135 tablet 0  ?  Sig: Take 25 mcg po qam prior to breakfast and every other day take 1.5 tabs (37.5 mcg)  ?  ? Endocrinology:  Hypothyroid Agents Failed - 02/21/2022 12:17 PM   ?  ?  Failed - TSH in normal range and within 360 days  ?  TSH  ?Date Value Ref Range Status  ?02/14/2022 0.03 (L) mIU/L Final  ?  Comment:  ?            Reference Range ?. ?          > or = 20 Years  0.40-4.50 ?. ?               Pregnancy Ranges ?          First trimester    0.26-2.66 ?          Second trimester   0.55-2.73 ?          Third trimester    0.43-2.91 ?  ?   ?  ?  Passed - Valid encounter within last 12 months  ?  Recent Outpatient Visits   ?      ? 1 week ago Diabetes mellitus with microalbuminuria ([place])  ? [place], DO  ? 4 months ago Viral URI  ? [place], DO  ? 7 months ago Diabetes mellitus with microalbuminuria ([place])  ? [facility] Delsa Grana, PA-C  ? 8 months ago Routine general medical examination at a health care facility  ? [facility], Malachy Mood, NP  ? 1 year ago Upper respiratory tract infection due to COVID-19 virus  ? [facility] Delsa Grana, [place]  ?  ?  ? ?  ?  ?  ? ? ?

## 2022-03-01 ENCOUNTER — Other Ambulatory Visit: Payer: Self-pay

## 2022-04-01 ENCOUNTER — Other Ambulatory Visit: Payer: Self-pay

## 2022-04-19 ENCOUNTER — Other Ambulatory Visit: Payer: Self-pay

## 2022-04-19 ENCOUNTER — Encounter: Payer: Self-pay | Admitting: Family Medicine

## 2022-04-19 ENCOUNTER — Ambulatory Visit (INDEPENDENT_AMBULATORY_CARE_PROVIDER_SITE_OTHER): Payer: No Typology Code available for payment source | Admitting: Family Medicine

## 2022-04-19 VITALS — BP 126/74 | HR 100 | Temp 98.4°F | Resp 16 | Ht 64.0 in | Wt 194.0 lb

## 2022-04-19 DIAGNOSIS — I10 Essential (primary) hypertension: Secondary | ICD-10-CM

## 2022-04-19 DIAGNOSIS — G4719 Other hypersomnia: Secondary | ICD-10-CM

## 2022-04-19 DIAGNOSIS — R809 Proteinuria, unspecified: Secondary | ICD-10-CM

## 2022-04-19 DIAGNOSIS — R5383 Other fatigue: Secondary | ICD-10-CM

## 2022-04-19 DIAGNOSIS — E038 Other specified hypothyroidism: Secondary | ICD-10-CM

## 2022-04-19 DIAGNOSIS — E669 Obesity, unspecified: Secondary | ICD-10-CM

## 2022-04-19 DIAGNOSIS — E1129 Type 2 diabetes mellitus with other diabetic kidney complication: Secondary | ICD-10-CM | POA: Diagnosis not present

## 2022-04-19 DIAGNOSIS — Z6834 Body mass index (BMI) 34.0-34.9, adult: Secondary | ICD-10-CM

## 2022-04-19 MED ORDER — METFORMIN HCL ER 500 MG PO TB24: 500.0000 mg | ORAL_TABLET | Freq: Every day | ORAL | 3 refills | Status: DC

## 2022-04-19 MED ORDER — OZEMPIC (0.25 OR 0.5 MG/DOSE) 2 MG/3ML ~~LOC~~ SOPN
0.5000 mg | PEN_INJECTOR | SUBCUTANEOUS | 2 refills | Status: DC
  Filled 2022-04-19: qty 3, 28d supply, fill #0
  Filled 2022-06-09: qty 6, 56d supply, fill #1

## 2022-04-19 NOTE — Patient Instructions (Signed)
It was great to see you!  Our plans for today:  - Continue your ozempic at 0.'5mg'$  dose. - Decrease your metformin dose to '500mg'$  daily.   We are checking some labs today, we will release these results to your MyChart.  Take care and seek immediate care sooner if you develop any concerns.   Dr. Ky Barban

## 2022-04-19 NOTE — Assessment & Plan Note (Signed)
Slightly overtreated at last visit, will recheck today.

## 2022-04-19 NOTE — Assessment & Plan Note (Signed)
Doing well on current regimen, no changes made today. 

## 2022-04-19 NOTE — Progress Notes (Signed)
    SUBJECTIVE:   CHIEF COMPLAINT / HPI:   Diabetes, Type 2 Obesity - Last A1c 6.3 02/14/2022 - Medications: ozempic, metformin. Previously on trulicity. - Compliance: good - Checking BG at home: yes, 70-80. - Diet: 2 meals per day. Diet sodas.  - Exercise: cardio 2x week at gym.  - Eye exam: [facility] - Foot exam: UTD - Microalbumin: obtaining today - Statin: yes - PNA vaccine: UTD - Endorsing occasional nausea, jitteriness.   OBJECTIVE:   BP 126/74   Pulse 100   Temp 98.4 F (36.9 C) (Oral)   Resp 16   Ht '5\' 4"'$  (1.626 m)   Wt 194 lb (88 kg)   SpO2 96%   BMI 33.30 kg/m   Gen: well appearing, in NAD Card: RRR Lungs: CTAB Ext: WWP, no edema   ASSESSMENT/PLAN:   Essential hypertension, benign Doing well on current regimen, no changes made today.  Diabetes mellitus with microalbuminuria (HCC) With some lower CBGs and hypoglycemic symptoms since starting ozempic for weight control, will decrease metformin dosing. If continues to remain symptomatic at follow up, consider decreasing or discontinuing ozempic. Will continue 0.'5mg'$  dosing for now. Obtaining urine microalbumin today. F/u in 2 months with repeat a1c at that time.  Hypothyroid Slightly overtreated at last visit, will recheck today.  Class 1 obesity with serious comorbidity and body mass index (BMI) of 34.0 to 34.9 in adult Down 6 lbs since initiating ozempic. Continue efforts with diet and exercise.      Myles Gip, DO

## 2022-04-19 NOTE — Assessment & Plan Note (Signed)
Down 6 lbs since initiating ozempic. Continue efforts with diet and exercise.

## 2022-04-19 NOTE — Assessment & Plan Note (Signed)
With some lower CBGs and hypoglycemic symptoms since starting ozempic for weight control, will decrease metformin dosing. If continues to remain symptomatic at follow up, consider decreasing or discontinuing ozempic. Will continue 0.'5mg'$  dosing for now. Obtaining urine microalbumin today. F/u in 2 months with repeat a1c at that time.

## 2022-04-20 ENCOUNTER — Other Ambulatory Visit: Payer: Self-pay

## 2022-04-20 LAB — BASIC METABOLIC PANEL
BUN: 11 mg/dL (ref 7–25)
CO2: 21 mmol/L (ref 20–32)
Calcium: 9.2 mg/dL (ref 8.6–10.4)
Chloride: 104 mmol/L (ref 98–110)
Creat: 0.61 mg/dL (ref 0.50–1.03)
Glucose, Bld: 156 mg/dL — ABNORMAL HIGH (ref 65–99)
Potassium: 4 mmol/L (ref 3.5–5.3)
Sodium: 138 mmol/L (ref 135–146)

## 2022-04-20 LAB — TSH: TSH: 0.04 mIU/L — ABNORMAL LOW

## 2022-04-20 MED ORDER — LEVOTHYROXINE SODIUM 25 MCG PO TABS
25.0000 ug | ORAL_TABLET | Freq: Every day | ORAL | 0 refills | Status: DC
  Filled 2022-04-20 – 2022-10-21 (×2): qty 90, 90d supply, fill #0

## 2022-04-20 NOTE — Addendum Note (Signed)
Addended by: Myles Gip on: 04/20/2022 09:09 AM   Modules accepted: Orders

## 2022-04-22 ENCOUNTER — Other Ambulatory Visit: Payer: Self-pay

## 2022-05-10 ENCOUNTER — Telehealth: Payer: Self-pay | Admitting: Family Medicine

## 2022-05-10 NOTE — Telephone Encounter (Signed)
Referral Request - Has patient seen PCP for this complaint? yes *If NO, is insurance requiring patient see PCP for this issue before PCP can refer them? Referral for which specialty:colonoscopy Preferred provider/office: ? Reason for referral: Patient was told should get retested because 6-10 polyps were found

## 2022-05-11 NOTE — Telephone Encounter (Signed)
Called pt informed Julie Marquez message to her, she states she will call GI office first to see if she needs a referral or she can just schedule.

## 2022-05-11 NOTE — Telephone Encounter (Signed)
Pt returned call and asked for a call back on [phone].

## 2022-05-11 NOTE — Telephone Encounter (Signed)
Tried to reach out  patient no answer unable to leave VM.

## 2022-05-13 ENCOUNTER — Other Ambulatory Visit: Payer: Self-pay

## 2022-05-13 ENCOUNTER — Telehealth: Payer: Self-pay

## 2022-05-13 DIAGNOSIS — Z86010 Personal history of colonic polyps: Secondary | ICD-10-CM

## 2022-05-13 MED ORDER — SUTAB 1479-225-188 MG PO TABS
ORAL_TABLET | ORAL | 0 refills | Status: DC
  Filled 2022-05-13: qty 48, 1d supply, fill #0

## 2022-05-16 ENCOUNTER — Other Ambulatory Visit: Payer: Self-pay

## 2022-06-08 ENCOUNTER — Other Ambulatory Visit: Payer: Self-pay

## 2022-06-09 ENCOUNTER — Other Ambulatory Visit: Payer: Self-pay

## 2022-06-28 ENCOUNTER — Encounter: Payer: No Typology Code available for payment source | Admitting: Family Medicine

## 2022-06-29 ENCOUNTER — Encounter: Payer: No Typology Code available for payment source | Admitting: Family Medicine

## 2022-06-29 DIAGNOSIS — Z0000 Encounter for general adult medical examination without abnormal findings: Secondary | ICD-10-CM

## 2022-06-29 DIAGNOSIS — F419 Anxiety disorder, unspecified: Secondary | ICD-10-CM

## 2022-06-29 DIAGNOSIS — Z1231 Encounter for screening mammogram for malignant neoplasm of breast: Secondary | ICD-10-CM

## 2022-06-29 DIAGNOSIS — Z124 Encounter for screening for malignant neoplasm of cervix: Secondary | ICD-10-CM

## 2022-06-29 DIAGNOSIS — E038 Other specified hypothyroidism: Secondary | ICD-10-CM

## 2022-06-29 DIAGNOSIS — I10 Essential (primary) hypertension: Secondary | ICD-10-CM

## 2022-06-29 DIAGNOSIS — E782 Mixed hyperlipidemia: Secondary | ICD-10-CM

## 2022-06-29 DIAGNOSIS — E1129 Type 2 diabetes mellitus with other diabetic kidney complication: Secondary | ICD-10-CM

## 2022-07-06 ENCOUNTER — Other Ambulatory Visit: Payer: Self-pay

## 2022-07-06 ENCOUNTER — Other Ambulatory Visit: Payer: Self-pay | Admitting: Family Medicine

## 2022-07-06 DIAGNOSIS — I10 Essential (primary) hypertension: Secondary | ICD-10-CM

## 2022-07-06 DIAGNOSIS — E1129 Type 2 diabetes mellitus with other diabetic kidney complication: Secondary | ICD-10-CM

## 2022-07-06 DIAGNOSIS — F419 Anxiety disorder, unspecified: Secondary | ICD-10-CM

## 2022-07-06 NOTE — Patient Instructions (Signed)
Preventive Care 51-51 Years Old, Female Preventive care refers to lifestyle choices and visits with your health care provider that can promote health and wellness. Preventive care visits are also called wellness exams. What can I expect for my preventive care visit? Counseling Your health care provider may ask you questions about your: Medical history, including: Past medical problems. Family medical history. Pregnancy history. Current health, including: Menstrual cycle. Method of birth control. Emotional well-being. Home life and relationship well-being. Sexual activity and sexual health. Lifestyle, including: Alcohol, nicotine or tobacco, and drug use. Access to firearms. Diet, exercise, and sleep habits. Work and work environment. Sunscreen use. Safety issues such as seatbelt and bike helmet use. Physical exam Your health care provider will check your: Height and weight. These may be used to calculate your BMI (body mass index). BMI is a measurement that tells if you are at a healthy weight. Waist circumference. This measures the distance around your waistline. This measurement also tells if you are at a healthy weight and may help predict your risk of certain diseases, such as type 2 diabetes and high blood pressure. Heart rate and blood pressure. Body temperature. Skin for abnormal spots. What immunizations do I need?  Vaccines are usually given at various ages, according to a schedule. Your health care provider will recommend vaccines for you based on your age, medical history, and lifestyle or other factors, such as travel or where you work. What tests do I need? Screening Your health care provider may recommend screening tests for certain conditions. This may include: Lipid and cholesterol levels. Diabetes screening. This is done by checking your blood sugar (glucose) after you have not eaten for a while (fasting). Pelvic exam and Pap test. Hepatitis B test. Hepatitis C  test. HIV (human immunodeficiency virus) test. STI (sexually transmitted infection) testing, if you are at risk. Lung cancer screening. Colorectal cancer screening. Mammogram. Talk with your health care provider about when you should start having regular mammograms. This may depend on whether you have a family history of breast cancer. BRCA-related cancer screening. This may be done if you have a family history of breast, ovarian, tubal, or peritoneal cancers. Bone density scan. This is done to screen for osteoporosis. Talk with your health care provider about your test results, treatment options, and if necessary, the need for more tests. Follow these instructions at home: Eating and drinking  Eat a diet that includes fresh fruits and vegetables, whole grains, lean protein, and low-fat dairy products. Take vitamin and mineral supplements as recommended by your health care provider. Do not drink alcohol if: Your health care provider tells you not to drink. You are pregnant, may be pregnant, or are planning to become pregnant. If you drink alcohol: Limit how much you have to 0-1 drink a day. Know how much alcohol is in your drink. In the [place] one drink equals one 12 oz bottle of beer (355 mL), one 5 oz glass of wine (148 mL), or one 1 oz glass of hard liquor (44 mL). Lifestyle Brush your teeth every morning and night with fluoride toothpaste. Floss one time each day. Exercise for at least 30 minutes 5 or more days each week. Do not use any products that contain nicotine or tobacco. These products include cigarettes, chewing tobacco, and vaping devices, such as e-cigarettes. If you need help quitting, ask your health care provider. Do not use drugs. If you are sexually active, practice safe sex. Use a condom or other form of protection to   prevent STIs. If you do not wish to become pregnant, use a form of birth control. If you plan to become pregnant, see your health care provider for a  prepregnancy visit. Take aspirin only as told by your health care provider. Make sure that you understand how much to take and what form to take. Work with your health care provider to find out whether it is safe and beneficial for you to take aspirin daily. Find healthy ways to manage stress, such as: Meditation, yoga, or listening to music. Journaling. Talking to a trusted person. Spending time with friends and family. Minimize exposure to UV radiation to reduce your risk of skin cancer. Safety Always wear your seat belt while driving or riding in a vehicle. Do not drive: If you have been drinking alcohol. Do not ride with someone who has been drinking. When you are tired or distracted. While texting. If you have been using any mind-altering substances or drugs. Wear a helmet and other protective equipment during sports activities. If you have firearms in your house, make sure you follow all gun safety procedures. Seek help if you have been physically or sexually abused. What's next? Visit your health care provider once a year for an annual wellness visit. Ask your health care provider how often you should have your eyes and teeth checked. Stay up to date on all vaccines. This information is not intended to replace advice given to you by your health care provider. Make sure you discuss any questions you have with your health care provider. Document Revised: 05/05/2021 Document Reviewed: 05/05/2021 Elsevier Patient Education  [place].

## 2022-07-07 ENCOUNTER — Other Ambulatory Visit (HOSPITAL_COMMUNITY)
Admission: RE | Admit: 2022-07-07 | Discharge: 2022-07-07 | Disposition: A | Discharge status: Home / Self Care | Payer: No Typology Code available for payment source | Source: Ambulatory Visit | Attending: Family Medicine | Admitting: Family Medicine

## 2022-07-07 ENCOUNTER — Ambulatory Visit (INDEPENDENT_AMBULATORY_CARE_PROVIDER_SITE_OTHER): Payer: No Typology Code available for payment source | Admitting: Family Medicine

## 2022-07-07 ENCOUNTER — Other Ambulatory Visit: Payer: Self-pay

## 2022-07-07 ENCOUNTER — Encounter: Payer: Self-pay | Admitting: Family Medicine

## 2022-07-07 VITALS — BP 112/70 | HR 96 | Temp 98.1°F | Resp 18 | Ht 63.5 in | Wt 195.9 lb

## 2022-07-07 DIAGNOSIS — Z5181 Encounter for therapeutic drug level monitoring: Secondary | ICD-10-CM

## 2022-07-07 DIAGNOSIS — E669 Obesity, unspecified: Secondary | ICD-10-CM

## 2022-07-07 DIAGNOSIS — E038 Other specified hypothyroidism: Secondary | ICD-10-CM

## 2022-07-07 DIAGNOSIS — E782 Mixed hyperlipidemia: Secondary | ICD-10-CM

## 2022-07-07 DIAGNOSIS — Z124 Encounter for screening for malignant neoplasm of cervix: Secondary | ICD-10-CM

## 2022-07-07 DIAGNOSIS — F320 Major depressive disorder, single episode, mild: Secondary | ICD-10-CM

## 2022-07-07 DIAGNOSIS — Z23 Encounter for immunization: Secondary | ICD-10-CM

## 2022-07-07 DIAGNOSIS — Z6834 Body mass index (BMI) 34.0-34.9, adult: Secondary | ICD-10-CM

## 2022-07-07 DIAGNOSIS — Z1231 Encounter for screening mammogram for malignant neoplasm of breast: Secondary | ICD-10-CM | POA: Diagnosis not present

## 2022-07-07 DIAGNOSIS — I10 Essential (primary) hypertension: Secondary | ICD-10-CM | POA: Diagnosis not present

## 2022-07-07 DIAGNOSIS — Z0000 Encounter for general adult medical examination without abnormal findings: Secondary | ICD-10-CM

## 2022-07-07 DIAGNOSIS — R809 Proteinuria, unspecified: Secondary | ICD-10-CM

## 2022-07-07 DIAGNOSIS — E66811 Obesity, class 1: Secondary | ICD-10-CM

## 2022-07-07 DIAGNOSIS — J4540 Moderate persistent asthma, uncomplicated: Secondary | ICD-10-CM

## 2022-07-07 DIAGNOSIS — E1129 Type 2 diabetes mellitus with other diabetic kidney complication: Secondary | ICD-10-CM | POA: Diagnosis not present

## 2022-07-07 DIAGNOSIS — F419 Anxiety disorder, unspecified: Secondary | ICD-10-CM

## 2022-07-07 MED ORDER — LOSARTAN POTASSIUM 50 MG PO TABS
ORAL_TABLET | Freq: Every day | ORAL | 3 refills | Status: DC
  Filled 2022-07-07: qty 90, 90d supply, fill #0
  Filled 2022-10-21: qty 90, 90d supply, fill #1
  Filled 2023-02-03: qty 90, 90d supply, fill #2
  Filled 2023-04-12 – 2023-05-09 (×2): qty 90, 90d supply, fill #3

## 2022-07-07 MED ORDER — SEMAGLUTIDE (1 MG/DOSE) 4 MG/3ML ~~LOC~~ SOPN
1.0000 mg | PEN_INJECTOR | SUBCUTANEOUS | 1 refills | Status: DC
  Filled 2022-07-07: qty 3, 28d supply, fill #0
  Filled 2022-07-28: qty 9, 84d supply, fill #0
  Filled 2022-08-12: qty 3, 28d supply, fill #0
  Filled 2022-09-02 – 2022-12-30 (×2): qty 3, 28d supply, fill #1

## 2022-07-07 MED ORDER — ATORVASTATIN CALCIUM 40 MG PO TABS
40.0000 mg | ORAL_TABLET | Freq: Every day | ORAL | 3 refills | Status: DC
  Filled 2022-07-07 – 2022-09-26 (×2): qty 90, 90d supply, fill #0
  Filled 2022-12-30 (×2): qty 90, 90d supply, fill #1
  Filled 2023-04-12: qty 90, 90d supply, fill #2

## 2022-07-07 MED ORDER — FLUOXETINE HCL 40 MG PO CAPS
ORAL_CAPSULE | Freq: Every day | ORAL | 3 refills | Status: DC
  Filled 2022-07-07: qty 90, 90d supply, fill #0
  Filled 2022-10-21: qty 90, 90d supply, fill #1
  Filled 2023-02-03: qty 90, 90d supply, fill #2
  Filled 2023-05-09: qty 90, 90d supply, fill #3

## 2022-07-07 MED ORDER — ALBUTEROL SULFATE HFA 108 (90 BASE) MCG/ACT IN AERS
2.0000 | INHALATION_SPRAY | Freq: Four times a day (QID) | RESPIRATORY_TRACT | 2 refills | Status: DC | PRN
  Filled 2022-07-07: qty 18, 25d supply, fill #0

## 2022-07-07 NOTE — Assessment & Plan Note (Signed)
Recent dose decrease with low TSH, recheck today, pt feels better on lower dose

## 2022-07-07 NOTE — Assessment & Plan Note (Signed)
BP stable and well controlled on current meds BP Readings from Last 3 Encounters:  07/07/22 112/70  04/19/22 126/74  02/14/22 118/82  no sx or concerns Monitoring renal function

## 2022-07-07 NOTE — Assessment & Plan Note (Signed)
Mood good on current dose/med phq 9 and gad 7 reviewed meds refilled

## 2022-07-07 NOTE — Assessment & Plan Note (Signed)
Compliant with meds, no SE, no myalgias, fatigue or jaundice Labs done in March, reviewed today, labs at goal, continue same statin dose

## 2022-07-07 NOTE — Assessment & Plan Note (Signed)
No hypoglycemia, increase ozempic dose, monitor sx/SE and cbgs as well as weight - offered dietician referral Foot exam done today, she is utd on eye exam, is on ARB and statin Overall very well controlled

## 2022-07-07 NOTE — Assessment & Plan Note (Signed)
Stable, well controlled.

## 2022-07-07 NOTE — Progress Notes (Signed)
Patient: Julie Marquez, Female    DOB: 1971/05/02, 51 y.o.   MRN: [id number] Delsa Grana, PA-C Visit Date: 07/07/2022  Today's Provider: Delsa Grana, PA-C   Chief Complaint  Patient presents with   Annual Exam   Subjective:   Annual physical exam:  Julie Marquez is a 52 y.o. female who presents today for complete physical exam:  Exercise/Activity: no very active Diet/nutrition:  no particular efforts Sleep: no concerns  SDOH Screenings   Alcohol Screen: Low Risk  (07/07/2022)   Alcohol Screen    Last Alcohol Screening Score (AUDIT): 0  Depression (PHQ2-9): Low Risk  (07/07/2022)   Depression (PHQ2-9)    PHQ-2 Score: 0  Financial Resource Strain: Low Risk  (07/07/2022)   Overall Financial Resource Strain (CARDIA)    Difficulty of Paying Living Expenses: Not hard at all  Food Insecurity: No Food Insecurity (07/07/2022)   Hunger Vital Sign    Worried About Running Out of Food in the Last Year: Never true    [place] in the Last Year: Never true  Housing: Low Risk  (07/07/2022)   Housing    Last Housing Risk Score: 0  Physical Activity: Insufficiently Active (07/07/2022)   Exercise Vital Sign    Days of Exercise per Week: 3 days    Minutes of Exercise per Session: 30 min  Social Connections: Socially Integrated (07/07/2022)   Social Connection and Isolation Panel [NHANES]    Frequency of Communication with Friends and Family: More than three times a week    Frequency of Social Gatherings with Friends and Family: More than three times a week    Attends Religious Services: 1 to 4 times per year    Active Member of [employer] or Organizations: Yes    Attends Archivist Meetings: 1 to 4 times per year    Marital Status: Married  Stress: No Stress Concern Present (07/07/2022)   [employer] [place]    Feeling of Stress : Only a little  Tobacco Use: Medium Risk (07/07/2022)   Patient History     Smoking Tobacco Use: Former    Smokeless Tobacco Use: Never    Passive Exposure: Not on file  Transportation Needs: No Transportation Needs (07/07/2022)   PRAPARE - Hydrologist (Medical): No    Lack of Transportation (Non-Medical): No     Due for routine f/up - she has seen Dr. Ky Barban but not all labs completed and I have not seen her for 1 year  DM:   Pt managing DM with ozempic and metformin Denies: Polyuria, polydipsia, vision changes, neuropathy, hypoglycemia Recent pertinent labs: Lab Results  Component Value Date   HGBA1C 6.3 (H) 02/14/2022   HGBA1C 6.1 (H) 06/03/2021   HGBA1C 5.8 (H) 05/27/2020   Lab Results  Component Value Date   MICROALBUR 1.2 05/27/2020   LDLCALC 66 02/14/2022   CREATININE 0.61 04/19/2022   Standard of care and health maintenance: Urine Microalbumin:  due Foot exam:  due DM eye exam:  utd ACEI/ARB:  losartan Statin:  lipitor 40  Hyperlipidemia: Lipitor - good compliance labs donethis year, well controlled Last Lipids: Lab Results  Component Value Date   CHOL 153 02/14/2022   HDL 52 02/14/2022   LDLCALC 66 02/14/2022   TRIG 293 (H) 02/14/2022   CHOLHDL 2.9 02/14/2022   - Denies: Chest pain, shortness of breath, myalgias, claudication  Hypertension:  Currently managed on losartan  50 mg daily Pt reports good med compliance and denies any SE.   Blood pressure today is well controlled. BP Readings from Last 3 Encounters:  07/07/22 112/70  04/19/22 126/74  02/14/22 118/82   Pt denies CP, SOB, exertional sx, LE edema, palpitation, Ha's, visual disturbances, lightheadedness, hypotension, syncope.   Obesity - pt discussed weight mgmt with Dr. Ky Barban, did loose weight after starting ozempic Wt Readings from Last 5 Encounters:  07/07/22 195 lb 14.4 oz (88.9 kg)  04/19/22 194 lb (88 kg)  02/14/22 200 lb 4.8 oz (90.9 kg)  09/27/21 195 lb (88.5 kg)  09/08/21 195 lb (88.5 kg)   BMI Readings from Last 5  Encounters:  07/07/22 34.16 kg/m  04/19/22 33.30 kg/m  02/14/22 34.38 kg/m  09/27/21 33.47 kg/m  09/08/21 34.54 kg/m   Hypothyroidism: For the past 3-4 weeks shes been taking 12.5 mcg dose (cut 25 mcg dose in half) - was supposed to reduce her dose - but Rx changes seem a little bit different, she feels better with the lower dose, hx of partial thyroidectomy Most recent results are below; we will be repeating labs today. Lab Results  Component Value Date   TSH 0.04 (L) 04/19/2022   T4TOTAL 11.4 04/20/2016      USPSTF grade A and B recommendations - reviewed and addressed today  Depression:  Phq 9 completed today by patient, was reviewed by me with patient in the room PHQ score is neg, pt feels mood good - due for refills    07/07/2022   11:01 AM 04/19/2022   10:01 AM 02/14/2022    1:41 PM 09/27/2021   10:54 AM  PHQ 2/9 Scores  PHQ - 2 Score 0 2 4 0  PHQ- 9 Score 0 2 4 0      07/07/2022   11:01 AM 04/19/2022   10:01 AM 02/14/2022    1:41 PM 09/27/2021   10:54 AM 07/09/2021   11:07 AM  Depression screen PHQ 2/9  Decreased Interest 0 1 3 0 1  Down, Depressed, Hopeless 0 1 1 0 1  PHQ - 2 Score 0 2 4 0 2  Altered sleeping 0 0 0 0 3  Tired, decreased energy 0 0 0 0 3  Change in appetite 0 0 0 0 0  Feeling bad or failure about yourself  0 0 0 0 0  Trouble concentrating 0 0 0 0 1  Moving slowly or fidgety/restless 0 0 0 0 0  Suicidal thoughts 0 0 0 0 0  PHQ-9 Score 0 2 4 0 9  Difficult doing work/chores Not difficult at all  Not difficult at all Not difficult at all Not difficult at all    Alcohol screening: [place] Office Visit from 07/07/2022 in [facility]  AUDIT-C Score 0       Immunizations and Health Maintenance: Health Maintenance  Topic Date Due   COVID-19 Vaccine (3 - Pfizer series) 11/06/2020   Zoster Vaccines- Shingrix (1 of 2) Never done   URINE MICROALBUMIN  05/27/2021   PAP SMEAR-Modifier  01/11/2022   MAMMOGRAM   06/11/2022   INFLUENZA VACCINE  06/21/2022   FOOT EXAM  07/09/2022   HEMOGLOBIN A1C  08/17/2022   COLONOSCOPY (Pts 45-28yr Insurance coverage will need to be confirmed)  09/08/2022   OPHTHALMOLOGY EXAM  12/22/2022   TETANUS/TDAP  07/10/2031   Hepatitis C Screening  Completed   HIV Screening  Completed   HPV VACCINES  Aged Out   Fecal  DNA (Cologuard)  Discontinued     Hep C Screening: done previously  STD testing and prevention (HIV/chl/gon/syphilis):  see above, no additional testing desired by pt today  Intimate partner violence:    Sexual History/Pain during Intercourse: Married  Menstrual History/LMP/Abnormal Bleeding: some irregular menses and spotting suspects perimenopausal No LMP recorded. (Menstrual status: IUD).  IUD no concerns  Incontinence Symptoms: none  Breast cancer:  due Last Mammogram: *see HM list above BRCA gene screening:   Cervical cancer screening: due Pt denies family hx of cancers - breast, ovarian, uterine, colon:     Osteoporosis:   Discussion on osteoporosis per age, including high calcium and vitamin D supplementation, weight bearing exercises Pt is not supplementing with daily calcium/Vit D.   Skin cancer:  Hx of skin CA -  NO Discussed atypical lesions   Colorectal cancer:   Colonoscopy is UTD   Discussed concerning signs and sx of CRC, pt denies change in bowels, blood in stool, she has f/up for polyps   Lung cancer:   Low Dose CT Chest recommended if Age 63-80 years, 20 pack-year currently smoking OR have quit w/in 15years. Patient does qualify.   Stopped smoking recently doing nicotine free vape Social History   Tobacco Use   Smoking status: Former    Packs/day: 0.25    Years: 20.00    Total pack years: 5.00    Types: Cigarettes    Quit date: 12/2021    Years since quitting: 0.5   Smokeless tobacco: Never  Vaping Use   Vaping Use: Every day  Substance Use Topics   Alcohol use: Not Currently    Comment: occas   Drug use:  No     Flowsheet Row Office Visit from 07/07/20[facility] in [facility]  AUDIT-C Score 0       Family History  Problem Relation Age of Onset   Heart disease Father    Alcohol abuse Father    Cancer Maternal Grandmother        breast   Cancer Paternal Grandmother        breast   Breast cancer Paternal Grandmother    Dementia Mother    Thyroid disease Sister    Alcohol abuse Brother    Alcohol abuse Brother      Blood pressure/Hypertension: BP Readings from Last 3 Encounters:  07/07/22 112/70  04/19/22 126/74  02/14/22 118/82    Weight/Obesity: Wt Readings from Last 3 Encounters:  07/07/22 195 lb 14.4 oz (88.9 kg)  04/19/22 194 lb (88 kg)  02/14/22 200 lb 4.8 oz (90.9 kg)   BMI Readings from Last 3 Encounters:  07/07/22 34.16 kg/m  04/19/22 33.30 kg/m  02/14/22 34.38 kg/m     Lipids:  Lab Results  Component Value Date   CHOL 153 02/14/2022   CHOL 157 06/03/2021   CHOL 163 05/27/2020   Lab Results  Component Value Date   HDL 52 02/14/2022   HDL 52 06/03/2021   HDL 52 05/27/2020   Lab Results  Component Value Date   LDLCALC 66 02/14/2022   LDLCALC 71 06/03/2021   LDLCALC 82 05/27/2020   Lab Results  Component Value Date   TRIG 293 (H) 02/14/2022   TRIG 246 (H) 06/03/2021   TRIG 193 (H) 05/27/2020   Lab Results  Component Value Date   CHOLHDL 2.9 02/14/2022   CHOLHDL 3.0 06/03/2021   CHOLHDL 3.1 05/27/2020   No results found for: "LDLDIRECT" Based on the results of lipid panel  his/her cardiovascular risk factor ( using [place] )  in the next 10 years is: The 10-year ASCVD risk score (Arnett DK, et al., 2019) is: 1.5%   Values used to calculate the score:     Age: 39 years     Sex: Female     Is Non-Hispanic African American: No     Diabetic: Yes     Tobacco smoker: No     Systolic Blood Pressure: 578 mmHg     Is BP treated: No     HDL Cholesterol: 52 mg/dL     Total Cholesterol: 153 mg/dL  Glucose:  Glucose, Bld   Date Value Ref Range Status  04/19/2022 156 (H) 65 - 99 mg/dL Final    Comment:    .            Fasting reference interval . For someone without known diabetes, a glucose value >125 mg/dL indicates that they may have diabetes and this should be confirmed with a follow-up test. .   02/14/2022 70 65 - 99 mg/dL Final    Comment:    .            Fasting reference interval .   06/03/2021 78 65 - 99 mg/dL Final    Comment:    .            Fasting reference interval .    Glucose-Capillary  Date Value Ref Range Status  09/08/2021 92 70 - 99 mg/dL Final    Comment:    Glucose reference range applies only to samples taken after fasting for at least 8 hours.    Advanced Care Planning:  A voluntary discussion about advance care planning including the explanation and discussion of advance directives.   Discussed health care proxy and Living will, and the patient was able to identify a health care proxy as husband.   Patient does not have a living will at present time.   Social History       Social History   Socioeconomic History   Marital status: Married    Spouse name: robert   Number of children: 1   Years of education: Not on file   Highest education level: Associate degree: occupational, Hotel manager, or vocational program  Occupational History   Not on file  Tobacco Use   Smoking status: Former    Packs/day: 0.25    Years: 20.00    Total pack years: 5.00    Types: Cigarettes    Quit date: 12/2021    Years since quitting: 0.5   Smokeless tobacco: Never  Vaping Use   Vaping Use: Every day  Substance and Sexual Activity   Alcohol use: Not Currently    Comment: occas   Drug use: No   Sexual activity: Yes    Birth control/protection: I.U.D.    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   Social Determinants of Health   Financial Resource Strain: Low Risk  (07/07/2022)   Overall  Financial Resource Strain (CARDIA)    Difficulty of Paying Living Expenses: Not hard at all  Food Insecurity: No Food Insecurity (07/07/2022)   Hunger Vital Sign    Worried About Running Out of Food in the Last Year: Never true    Ran Out of Food in the Last Year: Never true  Transportation Needs: No Transportation Needs (07/07/2022)   PRAPARE - Transportation  Lack of Transportation (Medical): No    Lack of Transportation (Non-Medical): No  Physical Activity: Insufficiently Active (07/07/2022)   Exercise Vital Sign    Days of Exercise per Week: 3 days    Minutes of Exercise per Session: 30 min  Stress: No Stress Concern Present (07/07/2022)   [place]    Feeling of Stress : Only a little  Social Connections: Socially Integrated (07/07/2022)   Social Connection and Isolation Panel [NHANES]    Frequency of Communication with Friends and Family: More than three times a week    Frequency of Social Gatherings with Friends and Family: More than three times a week    Attends Religious Services: 1 to 4 times per year    Active Member of [employer] or Organizations: Yes    Attends Archivist Meetings: 1 to 4 times per year    Marital Status: Married    Family History        Family History  Problem Relation Age of Onset   Heart disease Father    Alcohol abuse Father    Cancer Maternal Grandmother        breast   Cancer Paternal Grandmother        breast   Breast cancer Paternal Grandmother    Dementia Mother    Thyroid disease Sister    Alcohol abuse Brother    Alcohol abuse Brother     Patient Active Problem List   Diagnosis Date Noted   Positive colorectal cancer screening using Cologuard test    Polyp of colon    Excessive daytime sleepiness 07/09/2021   Mild intermittent asthma 07/14/2020   Mixed hyperlipidemia 04/18/2019   Compulsive gambling 12/03/2018   Diabetes mellitus with microalbuminuria ([place])  08/09/2018   Class 1 obesity with serious comorbidity and body mass index (BMI) of 34.0 to 34.9 in adult 08/09/2018   Tobacco abuse 05/09/2018   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   Essential hypertension, benign 05/09/2018   Anxiety 01/11/2017   Vitamin D deficiency 01/11/2017   Fatty liver 08/10/2016   Hypothyroid 04/22/2016   Chronic neck pain 02/17/2016    Past Surgical History:  Procedure Laterality Date   COLONOSCOPY WITH PROPOFOL N/A 09/08/2021   Procedure: COLONOSCOPY WITH PROPOFOL;  Surgeon: Virgel Manifold, MD;  Location: [facility] ENDOSCOPY;  Service: Endoscopy;  Laterality: N/A;   THYROIDECTOMY  2005     Current Outpatient Medications:    albuterol (VENTOLIN HFA) 108 (90 Base) MCG/ACT inhaler, Inhale 2 puffs into the lungs every 6 (six) hours as needed for wheezing or shortness of breath., Disp: 18 g, Rfl: 2   atorvastatin (LIPITOR) 40 MG tablet, Take 1 tablet (40 mg total) by mouth at bedtime., Disp: 90 tablet, Rfl: 3   Blood Glucose Monitoring Suppl (FREESTYLE FREEDOM LITE) w/Device KIT, use to check blood sugars, Disp: 1 kit, Rfl: 0   cyclobenzaprine (FLEXERIL) 10 MG tablet, TAKE 1 TABLET BY MOUTH EVERY 8 HOURS AS NEEDED FOR MUSCLE SPASMS., Disp: 180 tablet, Rfl: 0   fluticasone (FLONASE) 50 MCG/ACT nasal spray, Place 2 sprays into both nostrils daily as needed., Disp: 48 g, Rfl: 3   glucose blood (ACCU-CHEK GUIDE) test strip, Check fingerstick blood sugars once a day if desired; LON 99 months; Dx E11.9, Disp: 100 each, Rfl: 0   glucose blood (FREESTYLE LITE) test strip, use daily as directed, Disp: 100 each, Rfl: 0   Lancets (FREESTYLE) lancets, Use daily as directed, Disp: 100 each,  Rfl: 0   levothyroxine (SYNTHROID) 25 MCG tablet, Take 1 tablet (25 mcg total) by mouth daily before breakfast., Disp: 90 tablet, Rfl: 0   metFORMIN (GLUCOPHAGE-XR) 500 MG 24 hr tablet, Take 1 tablet (500 mg total) by mouth daily with breakfast., Disp: 90 tablet, Rfl: 3   PARAGARD INTRAUTERINE  COPPER IU, by Intrauterine route., Disp: , Rfl:    ReliOn Ultra Thin Lancets MISC, Check fingerstick blood sugars once a day if desired; LON 99 months, E11.9, Disp: 100 each, Rfl: 0   Semaglutide,0.25 or 0.5MG/DOS, (OZEMPIC, 0.25 OR 0.5 MG/DOSE,) 2 MG/3ML SOPN, Inject 0.5 mg into the skin once a week., Disp: 3 mL, Rfl: 2   Sod Picosulfate-Mag Ox-Cit Acd (CLENPIQ) 10-3.5-12 MG-GM -GM/160ML SOLN, Take 1 kit by mouth as directed. At 5 PM evening before procedure, drink 1 bottle of Clenpiq, hydrate, drink (5) 8 oz of water. Then do the same thing 5 hours prior to your procedure., Disp: 320 mL, Rfl: 0   Sodium Sulfate-Mag Sulfate-KCl (SUTAB) [phone] MG TABS, Per instructions given, Disp: 48 tablet, Rfl: 0   FLUoxetine (PROZAC) 40 MG capsule, TAKE 1 CAPSULE (40 MG TOTAL) BY MOUTH DAILY., Disp: 90 capsule, Rfl: 3   losartan (COZAAR) 50 MG tablet, TAKE 1 TABLET (50 MG TOTAL) BY MOUTH DAILY., Disp: 90 tablet, Rfl: 3  Allergies  Allergen Reactions   Lisinopril     cough    Patient Care Team: Delsa Grana, PA-C as PCP - General (Family Medicine) Ursula Alert, MD as Consulting Physician (Psychiatry)   Chart Review: I personally reviewed active problem list, medication list, allergies, family history, social history, health maintenance, notes from last encounter, lab results, imaging with the patient/caregiver today.   Review of Systems  Constitutional: Negative.   HENT: Negative.    Eyes: Negative.   Respiratory: Negative.    Cardiovascular: Negative.   Gastrointestinal: Negative.   Endocrine: Negative.   Genitourinary: Negative.   Musculoskeletal: Negative.   Skin: Negative.   Allergic/Immunologic: Negative.   Neurological: Negative.   Hematological: Negative.   Psychiatric/Behavioral: Negative.    All other systems reviewed and are negative.         Objective:   Vitals:  Vitals:   07/07/22 1106  BP: 112/70  Pulse: 96  Resp: 18  Temp: 98.1 F (36.7 C)  TempSrc: Oral   SpO2: 95%  Weight: 195 lb 14.4 oz (88.9 kg)  Height: 5' 3.5" (1.613 m)    Body mass index is 34.16 kg/m.  Physical Exam Vitals and nursing note reviewed. Exam conducted with a chaperone present.  Constitutional:      General: She is not in acute distress.    Appearance: Normal appearance. She is well-developed and well-groomed. She is obese. She is not ill-appearing, toxic-appearing or diaphoretic.  HENT:     Head: Normocephalic and atraumatic.     Right Ear: External ear normal.     Left Ear: External ear normal.     Nose: Nose normal. No congestion.     Mouth/Throat:     Mouth: Mucous membranes are moist.     Pharynx: Oropharynx is clear. Uvula midline. No oropharyngeal exudate.  Eyes:     General: Lids are normal.        Right eye: No discharge.     Conjunctiva/sclera: Conjunctivae normal.     Pupils: Pupils are equal, round, and reactive to light.  Neck:     Trachea: Phonation normal. No tracheal deviation.  Cardiovascular:     Rate  and Rhythm: Normal rate and regular rhythm.     Pulses: Normal pulses.          Radial pulses are 2+ on the right side and 2+ on the left side.       Posterior tibial pulses are 2+ on the right side and 2+ on the left side.     Heart sounds: Normal heart sounds. No murmur heard.    No friction rub. No gallop.  Pulmonary:     Effort: Pulmonary effort is normal. No respiratory distress.     Breath sounds: Normal breath sounds. No stridor. No wheezing, rhonchi or rales.  Chest:     Chest wall: No tenderness.  Abdominal:     General: Bowel sounds are normal. There is no distension.     Palpations: Abdomen is soft.  Genitourinary:    General: Normal vulva.     Vagina: Normal.     Cervix: Normal.     Uterus: Normal.      Adnexa: Right adnexa normal and left adnexa normal.     Comments: Weak vaginal walls w/o organ prolapse IUD strings visible Musculoskeletal:        General: No deformity.     Cervical back: Normal range of motion and  neck supple.     Right lower leg: No edema.     Left lower leg: No edema.  Lymphadenopathy:     Cervical: No cervical adenopathy.  Skin:    General: Skin is warm and dry.     Capillary Refill: Capillary refill takes less than 2 seconds.     Coloration: Skin is not pale.     Findings: No rash.  Neurological:     Mental Status: She is alert. Mental status is at baseline.     Motor: No abnormal muscle tone.     Gait: Gait normal.  Psychiatric:        Mood and Affect: Mood normal.        Speech: Speech normal.        Behavior: Behavior normal. Behavior is cooperative.       Fall Risk:    07/07/2022   11:00 AM 04/19/2022   10:01 AM 02/14/2022    1:40 PM 09/27/2021   10:54 AM 07/09/2021   10:45 AM  Fall Risk   Falls in the past year? 0 0 0 0 0  Number falls in past yr: 0 0 0 0 0  Injury with Fall? 0 0 0 0 0  Risk for fall due to : No Fall Risks      Follow up Falls prevention discussed;Education provided Falls evaluation completed       Functional Status Survey: Is the patient deaf or have difficulty hearing?: No Does the patient have difficulty seeing, even when wearing glasses/contacts?: No Does the patient have difficulty concentrating, remembering, or making decisions?: No Does the patient have difficulty walking or climbing stairs?: No Does the patient have difficulty dressing or bathing?: No Does the patient have difficulty doing errands alone such as visiting a doctor's office or shopping?: No   Assessment & Plan:    CPE completed today  USPSTF grade A and B recommendations reviewed with patient; age-appropriate recommendations, preventive care, screening tests, etc discussed and encouraged; healthy living encouraged; see AVS for patient education given to patient  Discussed importance of 150 minutes of physical activity weekly, AHA exercise recommendations given to pt in AVS/handout  Discussed importance of healthy diet:  eating lean meats and proteins,  avoiding  trans fats and saturated fats, avoid simple sugars and excessive carbs in diet, eat 6 servings of fruit/vegetables daily and drink plenty of water and avoid sweet beverages.    Recommended pt to do annual eye exam and routine dental exams/cleanings  Depression, alcohol, fall screening completed as documented above and per flowsheets  Advance Care planning information and packet discussed and offered today, encouraged pt to discuss with family members/spouse/partner/friends and complete Advanced directive packet and bring copy to office   Reviewed Health Maintenance: Health Maintenance  Topic Date Due   COVID-19 Vaccine (3 - Pfizer series) 11/06/2020   Zoster Vaccines- Shingrix (1 of 2) Never done   URINE MICROALBUMIN  05/27/2021   PAP SMEAR-Modifier  01/11/2022   MAMMOGRAM  06/11/2022   INFLUENZA VACCINE  06/21/2022   FOOT EXAM  07/09/2022   HEMOGLOBIN A1C  08/17/2022   COLONOSCOPY (Pts 45-72yr Insurance coverage will need to be confirmed)  09/08/2022   OPHTHALMOLOGY EXAM  12/22/2022   TETANUS/TDAP  07/10/2031   Hepatitis C Screening  Completed   HIV Screening  Completed   HPV VACCINES  Aged Out   Fecal DNA (Cologuard)  Discontinued    Immunizations: Immunization History  Administered Date(s) Administered   Influenza,inj,Quad PF,6+ Mos 08/09/2018, 08/22/2019   PFIZER(Purple Top)SARS-COV-2 Vaccination 08/14/2020, 09/11/2020   PNEUMOCOCCAL CONJUGATE-20 07/09/2021   Pneumococcal Polysaccharide-23 03/27/2017   Tdap 07/09/2021   Vaccines:  HPV: up to at age 51, ask insurance if age between 259-45 Shingrix: 570-64yo and ask insurance if covered when patient above 630yo Pneumonia:  educated and discussed with patient. Flu:  educated and discussed with patient. COVID:    Problem List Items Addressed This Visit       Cardiovascular and Mediastinum   Essential hypertension, benign (Chronic)    BP stable and well controlled on current meds BP Readings from Last 3 Encounters:   07/07/22 112/70  04/19/22 126/74  02/14/22 118/82  no sx or concerns Monitoring renal function        Relevant Medications   atorvastatin (LIPITOR) 40 MG tablet   losartan (COZAAR) 50 MG tablet   Other Relevant Orders   COMPLETE METABOLIC PANEL WITH GFR     Endocrine   Hypothyroid (Chronic)    Recent dose decrease with low TSH, recheck today, pt feels better on lower dose      Relevant Orders   TSH   Diabetes mellitus with microalbuminuria (HCC)    No hypoglycemia, increase ozempic dose, monitor sx/SE and cbgs as well as weight - offered dietician referral Foot exam done today, she is utd on eye exam, is on ARB and statin Overall very well controlled      Relevant Medications   atorvastatin (LIPITOR) 40 MG tablet   losartan (COZAAR) 50 MG tablet   Semaglutide, 1 MG/DOSE, 4 MG/3ML SOPN   Other Relevant Orders   COMPLETE METABOLIC PANEL WITH GFR   Hemoglobin A1c   Microalbumin / creatinine urine ratio     Other   Class 1 obesity with serious comorbidity and body mass index (BMI) of 34.0 to 34.9 in adult   Relevant Medications   Semaglutide, 1 MG/DOSE, 4 MG/3ML SOPN   Anxiety    Stable, well controlled      Relevant Medications   FLUoxetine (PROZAC) 40 MG capsule   Mixed hyperlipidemia    Compliant with meds, no SE, no myalgias, fatigue or jaundice Labs done in March, reviewed today, labs at goal, continue same  statin dose       Relevant Medications   atorvastatin (LIPITOR) 40 MG tablet   losartan (COZAAR) 50 MG tablet   Other Relevant Orders   COMPLETE METABOLIC PANEL WITH GFR   Current mild episode of major depressive disorder, unspecified whether recurrent (HCC)    Mood good on current dose/med phq 9 and gad 7 reviewed meds refilled      Relevant Medications   FLUoxetine (PROZAC) 40 MG capsule   Other Visit Diagnoses     Annual physical exam    -  Primary   Relevant Orders   CBC with Differential/Platelet   COMPLETE METABOLIC PANEL WITH GFR    Hemoglobin A1c   TSH   Encounter for screening mammogram for malignant neoplasm of breast       Relevant Orders   MM 3D SCREEN BREAST BILATERAL   Screening for cervical cancer       Relevant Orders   Cytology - PAP   Need for shingles vaccine       Moderate persistent asthma, unspecified whether complicated       Relevant Medications   albuterol (VENTOLIN HFA) 108 (90 Base) MCG/ACT inhaler   Encounter for medication monitoring       Relevant Orders   CBC with Differential/Platelet   COMPLETE METABOLIC PANEL WITH GFR   Hemoglobin A1c   TSH   Microalbumin / creatinine urine ratio            Delsa Grana, PA-C 07/07/22 11:15 AM  [facility]

## 2022-07-08 LAB — CBC WITH DIFFERENTIAL/PLATELET
Absolute Monocytes: 676 cells/uL (ref 200–950)
Basophils Absolute: 52 cells/uL (ref 0–200)
Basophils Relative: 0.5 %
Eosinophils Absolute: 135 cells/uL (ref 15–500)
Eosinophils Relative: 1.3 %
HCT: 38.8 % (ref 35.0–45.0)
Hemoglobin: 12.8 g/dL (ref 11.7–15.5)
Lymphs Abs: 2226 cells/uL (ref 850–3900)
MCH: 27 pg (ref 27.0–33.0)
MCHC: 33 g/dL (ref 32.0–36.0)
MCV: 81.9 fL (ref 80.0–100.0)
MPV: 11.1 fL (ref 7.5–12.5)
Monocytes Relative: 6.5 %
Neutro Abs: 7311 cells/uL (ref 1500–7800)
Neutrophils Relative %: 70.3 %
Platelets: 336 10*3/uL (ref 140–400)
RBC: 4.74 10*6/uL (ref 3.80–5.10)
RDW: 13.4 % (ref 11.0–15.0)
Total Lymphocyte: 21.4 %
WBC: 10.4 10*3/uL (ref 3.8–10.8)

## 2022-07-08 LAB — COMPLETE METABOLIC PANEL WITH GFR
AG Ratio: 1.7 (calc) (ref 1.0–2.5)
ALT: 14 U/L (ref 6–29)
AST: 12 U/L (ref 10–35)
Albumin: 4.3 g/dL (ref 3.6–5.1)
Alkaline phosphatase (APISO): 100 U/L (ref 37–153)
BUN: 14 mg/dL (ref 7–25)
CO2: 25 mmol/L (ref 20–32)
Calcium: 9.5 mg/dL (ref 8.6–10.4)
Chloride: 104 mmol/L (ref 98–110)
Creat: 0.71 mg/dL (ref 0.50–1.03)
Globulin: 2.5 g/dL (calc) (ref 1.9–3.7)
Glucose, Bld: 79 mg/dL (ref 65–99)
Potassium: 4.6 mmol/L (ref 3.5–5.3)
Sodium: 137 mmol/L (ref 135–146)
Total Bilirubin: 0.5 mg/dL (ref 0.2–1.2)
Total Protein: 6.8 g/dL (ref 6.1–8.1)
eGFR: 103 mL/min/{1.73_m2} (ref >=60)

## 2022-07-08 LAB — MICROALBUMIN / CREATININE URINE RATIO
Creatinine, Urine: 152 mg/dL (ref 20–275)
Microalb Creat Ratio: 23 mcg/mg creat (ref <30)
Microalb, Ur: 3.5 mg/dL

## 2022-07-08 LAB — TSH: TSH: 0.01 mIU/L — ABNORMAL LOW

## 2022-07-08 LAB — HEMOGLOBIN A1C
Hgb A1c MFr Bld: 5.6 % of total Hgb (ref <5.7)
Mean Plasma Glucose: 114 mg/dL
eAG (mmol/L): 6.3 mmol/L

## 2022-07-12 LAB — CYTOLOGY - PAP
Comment: NEGATIVE
Diagnosis: NEGATIVE
Diagnosis: REACTIVE
High risk HPV: NEGATIVE

## 2022-07-13 ENCOUNTER — Encounter: Payer: Self-pay | Admitting: Family Medicine

## 2022-07-13 ENCOUNTER — Other Ambulatory Visit: Payer: Self-pay

## 2022-07-21 ENCOUNTER — Other Ambulatory Visit: Payer: Self-pay

## 2022-07-28 ENCOUNTER — Other Ambulatory Visit: Payer: Self-pay

## 2022-07-28 DIAGNOSIS — R809 Proteinuria, unspecified: Secondary | ICD-10-CM

## 2022-07-28 MED ORDER — METFORMIN HCL ER 500 MG PO TB24
500.0000 mg | ORAL_TABLET | Freq: Every day | ORAL | 3 refills | Status: DC
  Filled 2022-07-28: qty 90, 90d supply, fill #0
  Filled 2022-10-21: qty 90, 90d supply, fill #1
  Filled 2023-02-03: qty 90, 90d supply, fill #2
  Filled 2023-05-09: qty 90, 90d supply, fill #3

## 2022-07-28 NOTE — Telephone Encounter (Signed)
Previous rx sent on 03/2022 it was not sent electronically and there was no confirmed pharmacy receipt on our end will send it again.

## 2022-07-29 ENCOUNTER — Other Ambulatory Visit: Payer: Self-pay

## 2022-08-12 ENCOUNTER — Other Ambulatory Visit: Payer: Self-pay

## 2022-08-16 ENCOUNTER — Other Ambulatory Visit: Payer: Self-pay

## 2022-09-02 ENCOUNTER — Encounter: Payer: Self-pay | Admitting: Family Medicine

## 2022-09-02 ENCOUNTER — Other Ambulatory Visit: Payer: Self-pay

## 2022-09-05 ENCOUNTER — Other Ambulatory Visit: Payer: Self-pay

## 2022-09-12 ENCOUNTER — Encounter
Admission: RE | Disposition: A | Discharge status: Home / Self Care | Payer: Self-pay | Source: Home / Self Care | Attending: Gastroenterology

## 2022-09-12 ENCOUNTER — Encounter: Payer: Self-pay | Admitting: Gastroenterology

## 2022-09-12 ENCOUNTER — Ambulatory Visit: Payer: No Typology Code available for payment source | Admitting: Anesthesiology

## 2022-09-12 ENCOUNTER — Ambulatory Visit
Admission: RE | Admit: 2022-09-12 | Discharge: 2022-09-12 | Disposition: A | Discharge status: Home / Self Care | Payer: No Typology Code available for payment source | Attending: Gastroenterology | Admitting: Gastroenterology

## 2022-09-12 DIAGNOSIS — Z1211 Encounter for screening for malignant neoplasm of colon: Secondary | ICD-10-CM | POA: Insufficient documentation

## 2022-09-12 DIAGNOSIS — E119 Type 2 diabetes mellitus without complications: Secondary | ICD-10-CM | POA: Insufficient documentation

## 2022-09-12 DIAGNOSIS — J45909 Unspecified asthma, uncomplicated: Secondary | ICD-10-CM | POA: Diagnosis not present

## 2022-09-12 DIAGNOSIS — Z7984 Long term (current) use of oral hypoglycemic drugs: Secondary | ICD-10-CM | POA: Insufficient documentation

## 2022-09-12 DIAGNOSIS — Z6834 Body mass index (BMI) 34.0-34.9, adult: Secondary | ICD-10-CM | POA: Diagnosis not present

## 2022-09-12 DIAGNOSIS — Z860100 Personal history of colon polyps, unspecified: Secondary | ICD-10-CM

## 2022-09-12 DIAGNOSIS — Z8249 Family history of ischemic heart disease and other diseases of the circulatory system: Secondary | ICD-10-CM | POA: Diagnosis not present

## 2022-09-12 DIAGNOSIS — Z8349 Family history of other endocrine, nutritional and metabolic diseases: Secondary | ICD-10-CM | POA: Insufficient documentation

## 2022-09-12 DIAGNOSIS — I10 Essential (primary) hypertension: Secondary | ICD-10-CM | POA: Diagnosis not present

## 2022-09-12 DIAGNOSIS — F418 Other specified anxiety disorders: Secondary | ICD-10-CM | POA: Diagnosis not present

## 2022-09-12 DIAGNOSIS — Z79899 Other long term (current) drug therapy: Secondary | ICD-10-CM | POA: Diagnosis not present

## 2022-09-12 DIAGNOSIS — E1129 Type 2 diabetes mellitus with other diabetic kidney complication: Secondary | ICD-10-CM

## 2022-09-12 DIAGNOSIS — D124 Benign neoplasm of descending colon: Secondary | ICD-10-CM | POA: Diagnosis not present

## 2022-09-12 DIAGNOSIS — K635 Polyp of colon: Secondary | ICD-10-CM | POA: Diagnosis not present

## 2022-09-12 DIAGNOSIS — D123 Benign neoplasm of transverse colon: Secondary | ICD-10-CM | POA: Insufficient documentation

## 2022-09-12 DIAGNOSIS — E039 Hypothyroidism, unspecified: Secondary | ICD-10-CM | POA: Diagnosis not present

## 2022-09-12 HISTORY — PX: COLONOSCOPY WITH PROPOFOL: SHX5780

## 2022-09-12 LAB — GLUCOSE, CAPILLARY: Glucose-Capillary: 145 mg/dL — ABNORMAL HIGH (ref 70–99)

## 2022-09-12 LAB — POCT PREGNANCY, URINE: Preg Test, Ur: NEGATIVE

## 2022-09-12 SURGERY — COLONOSCOPY WITH PROPOFOL
Anesthesia: General

## 2022-09-12 MED ORDER — PROPOFOL 10 MG/ML IV BOLUS
INTRAVENOUS | Status: DC | PRN
  Administered 2022-09-12: 80 mg via INTRAVENOUS

## 2022-09-12 MED ORDER — PROPOFOL 500 MG/50ML IV EMUL
INTRAVENOUS | Status: DC | PRN
  Administered 2022-09-12: 120 ug/kg/min via INTRAVENOUS

## 2022-09-12 MED ORDER — SODIUM CHLORIDE 0.9 % IV SOLN
INTRAVENOUS | Status: DC
  Administered 2022-09-12: 20 mL/h via INTRAVENOUS

## 2022-09-12 MED ORDER — LIDOCAINE HCL (CARDIAC) PF 100 MG/5ML IV SOSY
PREFILLED_SYRINGE | INTRAVENOUS | Status: DC | PRN
  Administered 2022-09-12: 50 mg via INTRAVENOUS

## 2022-09-12 MED ORDER — PROPOFOL 1000 MG/100ML IV EMUL
INTRAVENOUS | Status: AC
  Filled 2022-09-12: qty 200

## 2022-09-12 NOTE — H&P (Signed)
Cephas Darby, MD [address]  [place]  [place], [place] [address]  Main: [phone]  Fax: [phone] Pager: [phone]  Primary Care Physician:  Delsa Grana, PA-C Primary Gastroenterologist:  Dr. Cephas Darby  Pre-Procedure History & Physical: HPI:  Julie Marquez is a 51 y.o. female is here for an colonoscopy.   Past Medical History:  Diagnosis Date   Anxiety    Depression    Diabetes mellitus without complication ([place])    Diabetes mellitus, type II ([place])    Fatigue    Hypertension    Hypothyroidism    Panic attack    Tobacco abuse 05/09/2018   Vitamin D deficiency     Past Surgical History:  Procedure Laterality Date   COLONOSCOPY WITH PROPOFOL N/A 09/08/2021   Procedure: COLONOSCOPY WITH PROPOFOL;  Surgeon: Virgel Manifold, MD;  Location: [facility] ENDOSCOPY;  Service: Endoscopy;  Laterality: N/A;   THYROIDECTOMY  2005    Prior to Admission medications   Medication Sig Start Date End Date Taking? Authorizing Provider  albuterol (VENTOLIN HFA) 108 (90 Base) MCG/ACT inhaler Inhale 2 puffs into the lungs every 6 (six) hours as needed for wheezing or shortness of breath. 07/07/22  Yes Delsa Grana, PA-C  atorvastatin (LIPITOR) 40 MG tablet Take 1 tablet (40 mg total) by mouth at bedtime. 07/07/22  Yes Delsa Grana, PA-C  Blood Glucose Monitoring Suppl (FREESTYLE FREEDOM LITE) w/Device KIT use to check blood sugars 02/14/22  Yes Rumball, Jake Church, DO  cyclobenzaprine (FLEXERIL) 10 MG tablet TAKE 1 TABLET BY MOUTH EVERY 8 HOURS AS NEEDED FOR MUSCLE SPASMS. 03/04/21  Yes Delsa Grana, PA-C  FLUoxetine (PROZAC) 40 MG capsule TAKE 1 CAPSULE (40 MG TOTAL) BY MOUTH DAILY. 07/07/22 07/07/23 Yes Delsa Grana, PA-C  fluticasone (FLONASE) 50 MCG/ACT nasal spray Place 2 sprays into both nostrils daily as needed. 03/04/21  Yes Delsa Grana, PA-C  glucose blood (ACCU-CHEK GUIDE) test strip Check fingerstick blood sugars once a day if desired; LON 99 months; Dx E11.9 05/15/18   Yes Lada, Satira Anis, MD  glucose blood (FREESTYLE LITE) test strip use daily as directed 02/14/22  Yes   Lancets (FREESTYLE) lancets Use daily as directed 02/14/22  Yes   levothyroxine (SYNTHROID) 25 MCG tablet Take 1 tablet (25 mcg total) by mouth daily before breakfast. 04/20/22  Yes Rumball, Jake Church, DO  losartan (COZAAR) 50 MG tablet TAKE 1 TABLET (50 MG TOTAL) BY MOUTH DAILY. 07/07/22 07/07/23 Yes Delsa Grana, PA-C  metFORMIN (GLUCOPHAGE-XR) 500 MG 24 hr tablet Take 1 tablet (500 mg total) by mouth daily with breakfast. 07/28/22 07/23/23 Yes Delsa Grana, PA-C  PARAGARD INTRAUTERINE COPPER IU by Intrauterine route.   Yes [provider]  ReliOn Ultra Thin Lancets MISC Check fingerstick blood sugars once a day if desired; LON 99 months, E11.9 05/15/18  Yes Lada, Satira Anis, MD  Semaglutide, 1 MG/DOSE, 4 MG/3ML SOPN Inject 1 mg as directed once a week. 07/07/22  Yes Delsa Grana, PA-C  Sod Picosulfate-Mag Ox-Cit Acd (CLENPIQ) 10-3.5-12 MG-GM -GM/160ML SOLN Take 1 kit by mouth as directed. At 5 PM evening before procedure, drink 1 bottle of Clenpiq, hydrate, drink (5) 8 oz of water. Then do the same thing 5 hours prior to your procedure. 07/01/21  Yes Virgel Manifold, MD  Sodium Sulfate-Mag Sulfate-KCl (SUTAB) [phone] MG TABS Per instructions given 05/13/22  Yes Lin Landsman, MD    Allergies as of 05/13/2022 - Review Complete 04/19/2022  Allergen Reaction Noted  Lisinopril  05/09/2018    Family History  Problem Relation Age of Onset   Heart disease Father    Alcohol abuse Father    Cancer Maternal Grandmother        breast   Cancer Paternal Grandmother        breast   Breast cancer Paternal Grandmother    Dementia Mother    Thyroid disease Sister    Alcohol abuse Brother    Alcohol abuse Brother     Social History   Socioeconomic History   Marital status: Married    Spouse name: robert   Number of children: 1   Years of education: Not on file   Highest  education level: Associate degree: occupational, Hotel manager, or vocational program  Occupational History   Not on file  Tobacco Use   Smoking status: Former    Packs/day: 0.25    Years: 20.00    Total pack years: 5.00    Types: Cigarettes    Quit date: 12/2021    Years since quitting: 0.7   Smokeless tobacco: Never  Vaping Use   Vaping Use: Every day  Substance and Sexual Activity   Alcohol use: Not Currently    Comment: occas   Drug use: No   Sexual activity: Yes    Birth control/protection: I.U.D.    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   Social Determinants of Health   Financial Resource Strain: Low Risk  (07/07/2022)   Overall Financial Resource Strain (CARDIA)    Difficulty of Paying Living Expenses: Not hard at all  Food Insecurity: No Food Insecurity (07/07/2022)   Hunger Vital Sign    Worried About Running Out of Food in the Last Year: Never true    Ran Out of Food in the Last Year: Never true  Transportation Needs: No Transportation Needs (07/07/2022)   PRAPARE - Hydrologist (Medical): No    Lack of Transportation (Non-Medical): No  Physical Activity: Insufficiently Active (07/07/2022)   Exercise Vital Sign    Days of Exercise per Week: 3 days    Minutes of Exercise per Session: 30 min  Stress: No Stress Concern Present (07/07/2022)   [place]    Feeling of Stress : Only a little  Social Connections: Socially Integrated (07/07/2022)   Social Connection and Isolation Panel [NHANES]    Frequency of Communication with Friends and Family: More than three times a week    Frequency of Social Gatherings with Friends and Family: More than three times a week    Attends Religious Services: 1 to 4 times per year    Active Member of [employer] or Organizations: Yes    Attends Archivist  Meetings: 1 to 4 times per year    Marital Status: Married  Human resources officer Violence: Not At Risk (07/07/2022)   Humiliation, Afraid, Rape, and Kick questionnaire    Fear of Current or Ex-Partner: No    Emotionally Abused: No    Physically Abused: No    Sexually Abused: No    Review of Systems: See HPI, otherwise negative ROS  Physical Exam: BP (!) 138/93   Pulse (!) 1   Temp (!) 96.3 F (35.7 C) (Temporal)   Resp 20   Ht 5' 4"  (1.626 m)   Wt 90.3 kg   SpO2 95%  BMI 34.16 kg/m  General:   Alert,  pleasant and cooperative in NAD Head:  Normocephalic and atraumatic. Neck:  Supple; no masses or thyromegaly. Lungs:  Clear throughout to auscultation.    Heart:  Regular rate and rhythm. Abdomen:  Soft, nontender and nondistended. Normal bowel sounds, without guarding, and without rebound.   Neurologic:  Alert and  oriented x4;  grossly normal neurologically.  Impression/Plan: Julie Marquez is here for an colonoscopy to be performed for h/o colon polyps, previous colonoscopy fair prep  Risks, benefits, limitations, and alternatives regarding  colonoscopy have been reviewed with the patient.  Questions have been answered.  All parties agreeable.   Sherri Sear, MD  09/12/2022, 8:15 AM

## 2022-09-12 NOTE — Anesthesia Preprocedure Evaluation (Signed)
Anesthesia Evaluation  Patient identified by MRN, date of birth, ID band Patient awake    Reviewed: Allergy & Precautions, NPO status , Patient's Chart, lab work & pertinent test results  Airway Mallampati: III  TM Distance: >3 FB Neck ROM: Full    Dental  (+) Teeth Intact   Pulmonary neg pulmonary ROS, asthma , Patient abstained from smoking., former smoker,    Pulmonary exam normal  + decreased breath sounds      Cardiovascular Exercise Tolerance: Good hypertension, Pt. on medications negative cardio ROS Normal cardiovascular exam Rhythm:Regular     Neuro/Psych Anxiety Depression negative neurological ROS  negative psych ROS   GI/Hepatic negative GI ROS, Neg liver ROS,   Endo/Other  negative endocrine ROSdiabetes, Well Controlled, Type 2, Oral Hypoglycemic AgentsHypothyroidism Morbid obesity  Renal/GU negative Renal ROS  negative genitourinary   Musculoskeletal negative musculoskeletal ROS (+)   Abdominal (+) + obese,   Peds negative pediatric ROS (+)  Hematology negative hematology ROS (+)   Anesthesia Other Findings Past Medical History: No date: Anxiety No date: Depression No date: Diabetes mellitus without complication (HCC) No date: Diabetes mellitus, type II (HCC) No date: Fatigue No date: Hypertension No date: Hypothyroidism No date: Panic attack 05/09/2018: Tobacco abuse No date: Vitamin D deficiency  Past Surgical History: 09/08/2021: COLONOSCOPY WITH PROPOFOL; N/A     Comment:  Procedure: COLONOSCOPY WITH PROPOFOL;  Surgeon:               Virgel Manifold, MD;  Location: [facility] ENDOSCOPY;                Service: Endoscopy;  Laterality: N/A; 2005: THYROIDECTOMY     Reproductive/Obstetrics negative OB ROS                             Anesthesia Physical Anesthesia Plan  ASA: 3  Anesthesia Plan: General   Post-op Pain Management:    Induction:  Intravenous  PONV Risk Score and Plan: Propofol infusion and TIVA  Airway Management Planned: Natural Airway  Additional Equipment:   Intra-op Plan:   Post-operative Plan:   Informed Consent: I have reviewed the patients History and Physical, chart, labs and discussed the procedure including the risks, benefits and alternatives for the proposed anesthesia with the patient or authorized representative who has indicated his/her understanding and acceptance.     Dental Advisory Given  Plan Discussed with: CRNA and Surgeon  Anesthesia Plan Comments:         Anesthesia Quick Evaluation

## 2022-09-12 NOTE — Op Note (Signed)
[facility] Patient Name: Julie Marquez Procedure Date: 09/12/2022 6:57 AM MRN: [id number] Account #: [id number] Date of Birth: 1971/06/07 Admit Type: Outpatient Age: 51 Room: [facility] ENDO ROOM 2 Gender: Female Note Status: Finalized Instrument Name: Park Meo [id number] Procedure:             Colonoscopy Indications:           Surveillance: History of adenomatous polyps,                         inadequate prep on last exam (<56yr Providers:             RLin LandsmanMD, MD Referring MD:          LDelsa Grana(Referring MD) Medicines:             General Anesthesia Complications:         No immediate complications. Estimated blood loss: None. Procedure:             Pre-Anesthesia Assessment:                        - Prior to the procedure, a History and Physical was                         performed, and patient medications and allergies were                         reviewed. The patient is competent. The risks and                         benefits of the procedure and the sedation options and                         risks were discussed with the patient. All questions                         were answered and informed consent was obtained.                         Patient identification and proposed procedure were                         verified by the physician, the nurse, the                         anesthesiologist, the anesthetist and the technician                         in the pre-procedure area in the procedure room in the                         endoscopy suite. Mental Status Examination: alert and                         oriented. Airway Examination: normal oropharyngeal                         airway and neck mobility. Respiratory Examination:  clear to auscultation. CV Examination: normal.                         Prophylactic Antibiotics: The patient does not require                         prophylactic  antibiotics. Prior Anticoagulants: The                         patient has taken no previous anticoagulant or                         antiplatelet agents. ASA Grade Assessment: III - A                         patient with severe systemic disease. After reviewing                         the risks and benefits, the patient was deemed in                         satisfactory condition to undergo the procedure. The                         anesthesia plan was to use general anesthesia.                         Immediately prior to administration of medications,                         the patient was re-assessed for adequacy to receive                         sedatives. The heart rate, respiratory rate, oxygen                         saturations, blood pressure, adequacy of pulmonary                         ventilation, and response to care were monitored                         throughout the procedure. The physical status of the                         patient was re-assessed after the procedure.                        After obtaining informed consent, the colonoscope was                         passed under direct vision. Throughout the procedure,                         the patient's blood pressure, pulse, and oxygen                         saturations were monitored continuously. The  Colonoscope was introduced through the anus and                         advanced to the the terminal ileum, with                         identification of the appendiceal orifice and IC                         valve. The colonoscopy was performed without                         difficulty. The patient tolerated the procedure well.                         The quality of the bowel preparation was evaluated                         using the BBPS [facility] Bowel Preparation Scale) with                         scores of: Right Colon = 3, Transverse Colon = 3 and                         Left Colon  = 3 (entire mucosa seen well with no                         residual staining, small fragments of stool or opaque                         liquid). The total BBPS score equals 9. Findings:      The perianal and digital rectal examinations were normal. Pertinent       negatives include normal sphincter tone and no palpable rectal lesions.      Four sessile polyps were found in the sigmoid colon 1, descending colon       2 and transverse colon 1. The polyps were 3 to 7 mm in size. These       polyps were removed with a cold snare. Resection and retrieval were       complete. Estimated blood loss: none.      The retroflexed view of the distal rectum and anal verge was normal and       showed no anal or rectal abnormalities. Impression:            - Four 3 to 7 mm polyps in the sigmoid colon, in the                         descending colon and in the transverse colon, removed                         with a cold snare. Resected and retrieved.                        - The distal rectum and anal verge are normal on                         retroflexion  view. Recommendation:        - Discharge patient to home (with escort).                        - Resume previous diet today.                        - Continue present medications.                        - Await pathology results.                        - Repeat colonoscopy in 3 - 5 years for surveillance                         based on pathology results. Procedure Code(s):     --- Professional ---                        [phone], Colonoscopy, flexible; with removal of                         tumor(s), polyp(s), or other lesion(s) by snare                         technique Diagnosis Code(s):     --- Professional ---                        Z86.010, Personal history of colonic polyps                        K63.5, Polyp of colon CPT copyright 2019 American Medical Association. All rights reserved. The codes documented in this report are preliminary  and upon coder review may  be revised to meet current compliance requirements. Dr. Ulyess Mort Lin Landsman MD, MD 09/12/2022 8:46:29 AM This report has been signed electronically. Number of Addenda: 0 Note Initiated On: 09/12/2022 6:57 AM Scope Withdrawal Time: 0 hours 12 minutes 51 seconds  Total Procedure Duration: 0 hours 17 minutes 49 seconds  Estimated Blood Loss:  Estimated blood loss: none.      [facility]

## 2022-09-12 NOTE — Anesthesia Postprocedure Evaluation (Signed)
Anesthesia Post Note  Patient: Julie Marquez  Procedure(s) Performed: COLONOSCOPY WITH PROPOFOL  Patient location during evaluation: PACU Anesthesia Type: General Level of consciousness: awake and oriented Pain management: pain level controlled Vital Signs Assessment: post-procedure vital signs reviewed and stable Respiratory status: spontaneous breathing and respiratory function stable Cardiovascular status: stable Anesthetic complications: no   No notable events documented.   Last Vitals:  Vitals:   09/12/22 0847 09/12/22 0857  BP: 93/64 100/69  Pulse: 71 64  Resp: 15 15  Temp: (!) 35.7 C   SpO2: 96% 96%    Last Pain:  Vitals:   09/12/22 0857  TempSrc:   PainSc: 0-No pain                 VAN STAVEREN,Lyne Khurana

## 2022-09-12 NOTE — Transfer of Care (Signed)
Immediate Anesthesia Transfer of Care Note  Patient: Julie Marquez  Procedure(s) Performed: COLONOSCOPY WITH PROPOFOL  Patient Location: PACU and Endoscopy Unit  Anesthesia Type:General  Level of Consciousness: drowsy  Airway & Oxygen Therapy: Patient Spontanous Breathing  Post-op Assessment: Report given to RN  Post vital signs: stable  Last Vitals:  Vitals Value Taken Time  BP    Temp    Pulse    Resp    SpO2      Last Pain:  Vitals:   09/12/22 0743  TempSrc: Temporal  PainSc: 0-No pain         Complications: No notable events documented.

## 2022-09-13 LAB — SURGICAL PATHOLOGY

## 2022-09-14 ENCOUNTER — Encounter: Payer: Self-pay | Admitting: Gastroenterology

## 2022-09-14 ENCOUNTER — Encounter: Payer: Self-pay | Admitting: Family Medicine

## 2022-09-20 ENCOUNTER — Other Ambulatory Visit: Payer: Self-pay

## 2022-09-26 ENCOUNTER — Other Ambulatory Visit: Payer: Self-pay

## 2022-10-17 ENCOUNTER — Encounter: Payer: Self-pay | Admitting: Family Medicine

## 2022-10-21 ENCOUNTER — Other Ambulatory Visit: Payer: Self-pay

## 2022-10-24 ENCOUNTER — Other Ambulatory Visit: Payer: Self-pay

## 2022-10-24 ENCOUNTER — Other Ambulatory Visit: Payer: Self-pay | Admitting: Family Medicine

## 2022-10-24 DIAGNOSIS — Z5181 Encounter for therapeutic drug level monitoring: Secondary | ICD-10-CM

## 2022-10-24 DIAGNOSIS — R7989 Other specified abnormal findings of blood chemistry: Secondary | ICD-10-CM

## 2022-10-24 DIAGNOSIS — E038 Other specified hypothyroidism: Secondary | ICD-10-CM

## 2022-10-24 NOTE — Progress Notes (Signed)
Lab ordered - pt asked to make an appt still to address - can be after labs are done - also appt for med changes/questions etc    ICD-10-CM   1. Abnormal TSH  R79.89 TSH    2. Encounter for medication monitoring  Z51.81 TSH    3. Other specified hypothyroidism  E03.8 TSH     Delsa Grana, PA-C

## 2022-10-26 ENCOUNTER — Other Ambulatory Visit: Payer: Self-pay

## 2022-11-08 ENCOUNTER — Ambulatory Visit (INDEPENDENT_AMBULATORY_CARE_PROVIDER_SITE_OTHER): Payer: No Typology Code available for payment source | Admitting: Family Medicine

## 2022-11-08 ENCOUNTER — Encounter: Payer: Self-pay | Admitting: Family Medicine

## 2022-11-08 ENCOUNTER — Other Ambulatory Visit: Payer: Self-pay

## 2022-11-08 VITALS — BP 124/82 | HR 103 | Temp 98.2°F | Resp 18 | Ht 63.5 in | Wt 202.7 lb

## 2022-11-08 DIAGNOSIS — E038 Other specified hypothyroidism: Secondary | ICD-10-CM | POA: Diagnosis not present

## 2022-11-08 DIAGNOSIS — E1129 Type 2 diabetes mellitus with other diabetic kidney complication: Secondary | ICD-10-CM | POA: Diagnosis not present

## 2022-11-08 DIAGNOSIS — R809 Proteinuria, unspecified: Secondary | ICD-10-CM

## 2022-11-08 DIAGNOSIS — R7989 Other specified abnormal findings of blood chemistry: Secondary | ICD-10-CM | POA: Diagnosis not present

## 2022-11-08 MED ORDER — OZEMPIC (0.25 OR 0.5 MG/DOSE) 2 MG/3ML ~~LOC~~ SOPN
PEN_INJECTOR | SUBCUTANEOUS | 0 refills | Status: DC
  Filled 2022-11-08: qty 3, 28d supply, fill #0

## 2022-11-08 MED ORDER — SEMAGLUTIDE (1 MG/DOSE) 4 MG/3ML ~~LOC~~ SOPN
1.0000 mg | PEN_INJECTOR | SUBCUTANEOUS | 1 refills | Status: DC
  Filled 2022-11-08: qty 9, 84d supply, fill #0

## 2022-11-08 NOTE — Progress Notes (Signed)
Name: Julie Marquez   MRN: [id number]    DOB: 1970/11/22   Date:11/08/2022       Progress Note  Chief Complaint  Patient presents with   Follow-up   Diabetes   Obesity   Hypothyroidism     Subjective:   Julie Marquez is a 51 y.o. female, presents to clinic for med refill and f/up  She was off the meds for a little bit while traveling - ozempic -she had previously sent messages back-and-forth but it was difficult to tell what dose she was on and how long she had missed her weekly doses She estimates that she has not taken Ozempic for about 8 weeks She has gained a little bit of weight in that time but she is on it for diabetes management Her diabetes has been well-controlled Lab Results  Component Value Date   HGBA1C 5.6 07/07/2022   Thyroid disorder she previously was on levothyroxine her last couple of labs showed TSH was suppressed and abnormally low Lab Results  Component Value Date   TSH 0.01 (L) 07/07/2022   TSH 0.04 (L) 04/19/2022   TSH 0.03 (L) 02/14/2022  She is advised to stop taking the levothyroxine a few months ago and since August she has not taken any thyroid medication she also feels good, she was having some fatigue and taking naps and she feels that her energy is much better now, she is due for repeating a TSH this was previously ordered but not completed   Roughly 7 lbs weight increase since last routine OV in august Wt Readings from Last 5 Encounters:  11/08/22 202 lb 11.2 oz (91.9 kg)  09/12/22 199 lb (90.3 kg)  07/07/22 195 lb 14.4 oz (88.9 kg)  04/19/22 194 lb (88 kg)  02/14/22 200 lb 4.8 oz (90.9 kg)   BMI Readings from Last 5 Encounters:  11/08/22 35.34 kg/m  09/12/22 34.16 kg/m  07/07/22 34.16 kg/m  04/19/22 33.30 kg/m  02/14/22 34.38 kg/m            Current Outpatient Medications:    albuterol (VENTOLIN HFA) 108 (90 Base) MCG/ACT inhaler, Inhale 2 puffs into the lungs every 6 (six) hours as needed for wheezing or  shortness of breath., Disp: 18 g, Rfl: 2   atorvastatin (LIPITOR) 40 MG tablet, Take 1 tablet (40 mg total) by mouth at bedtime., Disp: 90 tablet, Rfl: 3   Blood Glucose Monitoring Suppl (FREESTYLE FREEDOM LITE) w/Device KIT, use to check blood sugars, Disp: 1 kit, Rfl: 0   cyclobenzaprine (FLEXERIL) 10 MG tablet, TAKE 1 TABLET BY MOUTH EVERY 8 HOURS AS NEEDED FOR MUSCLE SPASMS., Disp: 180 tablet, Rfl: 0   FLUoxetine (PROZAC) 40 MG capsule, TAKE 1 CAPSULE (40 MG TOTAL) BY MOUTH DAILY., Disp: 90 capsule, Rfl: 3   fluticasone (FLONASE) 50 MCG/ACT nasal spray, Place 2 sprays into both nostrils daily as needed., Disp: 48 g, Rfl: 3   glucose blood (ACCU-CHEK GUIDE) test strip, Check fingerstick blood sugars once a day if desired; LON 99 months; Dx E11.9, Disp: 100 each, Rfl: 0   glucose blood (FREESTYLE LITE) test strip, use daily as directed, Disp: 100 each, Rfl: 0   Lancets (FREESTYLE) lancets, Use daily as directed, Disp: 100 each, Rfl: 0   levothyroxine (SYNTHROID) 25 MCG tablet, Take 1 tablet (25 mcg total) by mouth daily before breakfast., Disp: 90 tablet, Rfl: 0   losartan (COZAAR) 50 MG tablet, TAKE 1 TABLET (50 MG TOTAL) BY MOUTH DAILY., Disp: 90  tablet, Rfl: 3   metFORMIN (GLUCOPHAGE-XR) 500 MG 24 hr tablet, Take 1 tablet (500 mg total) by mouth daily with breakfast., Disp: 90 tablet, Rfl: 3   PARAGARD INTRAUTERINE COPPER IU, by Intrauterine route., Disp: , Rfl:    ReliOn Ultra Thin Lancets MISC, Check fingerstick blood sugars once a day if desired; LON 99 months, E11.9, Disp: 100 each, Rfl: 0   Semaglutide, 1 MG/DOSE, 4 MG/3ML SOPN, Inject 1 mg as directed once a week., Disp: 9 mL, Rfl: 1  Patient Active Problem List   Diagnosis Date Noted   History of colonic polyps    Current mild episode of major depressive disorder, unspecified whether recurrent ([place]) 07/07/2022   Polyp of colon    Excessive daytime sleepiness 07/09/2021   Mild intermittent asthma 07/14/2020   Mixed hyperlipidemia  04/18/2019   Compulsive gambling 12/03/2018   Diabetes mellitus with microalbuminuria ([place]) 08/09/2018   Class 1 obesity with serious comorbidity and body mass index (BMI) of 34.0 to 34.9 in adult 08/09/2018   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   Essential hypertension, benign 05/09/2018   Anxiety 01/11/2017   Vitamin D deficiency 01/11/2017   Fatty liver 08/10/2016   Hypothyroid 04/22/2016   Chronic neck pain 02/17/2016    Past Surgical History:  Procedure Laterality Date   COLONOSCOPY WITH PROPOFOL N/A 09/08/2021   Procedure: COLONOSCOPY WITH PROPOFOL;  Surgeon: Virgel Manifold, MD;  Location: [facility] ENDOSCOPY;  Service: Endoscopy;  Laterality: N/A;   COLONOSCOPY WITH PROPOFOL N/A 09/12/2022   Procedure: COLONOSCOPY WITH PROPOFOL;  Surgeon: Lin Landsman, MD;  Location: [facility] ENDOSCOPY;  Service: Gastroenterology;  Laterality: N/A;   THYROIDECTOMY  2005    Family History  Problem Relation Age of Onset   Heart disease Father    Alcohol abuse Father    Cancer Maternal Grandmother        breast   Cancer Paternal Grandmother        breast   Breast cancer Paternal Grandmother    Dementia Mother    Thyroid disease Sister    Alcohol abuse Brother    Alcohol abuse Brother     Social History   Tobacco Use   Smoking status: Former    Packs/day: 0.25    Years: 20.00    Total pack years: 5.00    Types: Cigarettes    Quit date: 12/2021    Years since quitting: 0.8   Smokeless tobacco: Never  Vaping Use   Vaping Use: Every day  Substance Use Topics   Alcohol use: Not Currently    Comment: occas   Drug use: No     Allergies  Allergen Reactions   Lisinopril     cough    Health Maintenance  Topic Date Due   MAMMOGRAM  06/11/2022   COVID-19 Vaccine (3 - 2023-24 season) 11/24/2022 (Originally 07/22/2022)   Zoster Vaccines- Shingrix (1 of 2) 02/07/2023 (Originally 01/31/2021)   OPHTHALMOLOGY EXAM  12/22/2022   HEMOGLOBIN A1C  01/07/2023   Diabetic kidney evaluation  - eGFR measurement  07/08/2023   Diabetic kidney evaluation - Urine ACR  07/08/2023   FOOT EXAM  07/08/2023   PAP SMEAR-Modifier  07/07/2025   COLONOSCOPY (Pts 45-70yr Insurance coverage will need to be confirmed)  09/12/2025   DTaP/Tdap/Td (2 - Td or Tdap) 07/10/2031   INFLUENZA VACCINE  Completed   Hepatitis C Screening  Completed   HIV Screening  Completed   HPV VACCINES  Aged Out   Fecal DNA (Cologuard)  Discontinued  Chart Review Today: I personally reviewed active problem list, medication list, allergies, family history, social history, health maintenance, notes from last encounter, lab results, imaging with the patient/caregiver today.   Review of Systems  Constitutional: Negative.   HENT: Negative.    Eyes: Negative.   Respiratory: Negative.    Cardiovascular: Negative.   Gastrointestinal: Negative.   Endocrine: Negative.   Genitourinary: Negative.   Musculoskeletal: Negative.   Skin: Negative.   Allergic/Immunologic: Negative.   Neurological: Negative.   Hematological: Negative.   Psychiatric/Behavioral: Negative.    All other systems reviewed and are negative.    Objective:   Vitals:   11/08/22 1058  BP: 124/82  Pulse: (!) 103  Resp: 18  Temp: 98.2 F (36.8 C)  SpO2: 98%  Weight: 202 lb 11.2 oz (91.9 kg)  Height: 5' 3.5" (1.613 m)    Body mass index is 35.34 kg/m.  Physical Exam Vitals and nursing note reviewed.  Constitutional:      General: She is not in acute distress.    Appearance: Normal appearance. She is well-developed and overweight. She is not ill-appearing, toxic-appearing or diaphoretic.  HENT:     Head: Normocephalic and atraumatic.     Nose: Nose normal.  Eyes:     General:        Right eye: No discharge.        Left eye: No discharge.     Conjunctiva/sclera: Conjunctivae normal.  Neck:     Trachea: No tracheal deviation.  Cardiovascular:     Rate and Rhythm: Normal rate and regular rhythm.  Pulmonary:     Effort:  Pulmonary effort is normal. No respiratory distress.     Breath sounds: No stridor.  Musculoskeletal:        General: Normal range of motion.  Skin:    General: Skin is warm and dry.     Findings: No rash.  Neurological:     Mental Status: She is alert.     Motor: No abnormal muscle tone.     Coordination: Coordination normal.  Psychiatric:        Behavior: Behavior normal. Behavior is cooperative.         Assessment & Plan:   1. Diabetes mellitus with microalbuminuria ([place]) History of being well-controlled with her last A1c about 3 months ago 5.6 She was traveling in another country and had been on and off of her Ozempic.  She was hesitant to restart at the 1 mg dose so she has not taken it for about 8 weeks She previously tolerated starting the medicine and increasing the dose without any concerning side effects or GI issues but she would like the lower dose pens to help her restart it without any sudden issues She has gained a little bit of weight in the last couple months We will restart her Ozempic for about 2 weeks of the 0.25 mg weekly dose then increased for 2 weeks to 0.5 mg weekly dose and then she can resume her 1 mg dose weekly Refill sent to the pharmacy Reviewed possible side effects -if side effects or severe or do not improve within a week she was encouraged to reduce the dose and let me know if I can change the prescriptions  We will plan on rechecking her labs and doing routine follow-up in about 3 months  - Semaglutide,0.25 or 0.5MG/DOS, (OZEMPIC, 0.25 OR 0.5 MG/DOSE,) 2 MG/3ML SOPN; Inject 0.25 mg into the skin once a week for 14 days, THEN 0.5  mg once a week for 14 days.  Dispense: 3 mL; Refill: 0 - Semaglutide, 1 MG/DOSE, 4 MG/3ML SOPN; Inject 1 mg as directed once a week.  Dispense: 9 mL; Refill: 1  2. Abnormal TSH Lab Results  Component Value Date   TSH 0.01 (L) 07/07/2022   TSH 0.04 (L) 04/19/2022   TSH 0.03 (L) 02/14/2022   TSH 0.46 06/03/2021   TSH  0.66 05/27/2020   TSH 0.791 12/10/2019   TSH had been suppressed over the last year and she was on a low-dose of levothyroxine because of worsening low TSH levothyroxine was stopped Reviewed chart - going back to 2017 per this EMR pt was on 88 mcg, however for the past several years she was only taking 25 mcg daily- stopped meds x 3+ months She overall feels good/euthyroid, recheck labs today they were previously ordered  3. Other specified hypothyroidism See #2    Return in about 3 months (around 02/07/2023) for HTN, DM, thyroid f/up in office with labs.   Delsa Grana, PA-C 11/08/22 11:30 AM

## 2022-11-09 LAB — TEST AUTHORIZATION

## 2022-11-09 LAB — TSH: TSH: 0.14 mIU/L — ABNORMAL LOW

## 2022-11-09 LAB — T4, FREE: Free T4: 1 ng/dL (ref 0.8–1.8)

## 2022-11-09 NOTE — Addendum Note (Signed)
Addended by: Delsa Grana on: 11/09/2022 09:24 AM   Modules accepted: Orders

## 2022-11-23 ENCOUNTER — Other Ambulatory Visit: Payer: Self-pay

## 2022-11-23 ENCOUNTER — Encounter: Payer: Self-pay | Admitting: Nurse Practitioner

## 2022-11-23 ENCOUNTER — Ambulatory Visit (INDEPENDENT_AMBULATORY_CARE_PROVIDER_SITE_OTHER): Payer: 59 | Admitting: Nurse Practitioner

## 2022-11-23 ENCOUNTER — Encounter: Payer: Self-pay | Admitting: Family Medicine

## 2022-11-23 VITALS — BP 124/72 | HR 90 | Temp 98.1°F | Resp 16 | Ht 63.5 in | Wt 204.5 lb

## 2022-11-23 DIAGNOSIS — M545 Low back pain, unspecified: Secondary | ICD-10-CM

## 2022-11-23 LAB — POCT URINALYSIS DIPSTICK
Bilirubin, UA: NEGATIVE
Glucose, UA: NEGATIVE
Ketones, UA: NEGATIVE
Leukocytes, UA: NEGATIVE
Nitrite, UA: NEGATIVE
Protein, UA: POSITIVE — AB
Spec Grav, UA: 1.02 (ref 1.010–1.025)
Urobilinogen, UA: NEGATIVE E.U./dL — AB
pH, UA: 5 (ref 5.0–8.0)

## 2022-11-23 MED ORDER — KETOROLAC TROMETHAMINE 60 MG/2ML IM SOLN
60.0000 mg | Freq: Once | INTRAMUSCULAR | Status: AC
  Administered 2022-11-23: 60 mg via INTRAMUSCULAR

## 2022-11-23 MED ORDER — NAPROXEN 500 MG PO TABS
500.0000 mg | ORAL_TABLET | Freq: Two times a day (BID) | ORAL | 0 refills | Status: DC
  Filled 2022-11-23: qty 20, 10d supply, fill #0

## 2022-11-23 MED ORDER — METHOCARBAMOL 500 MG PO TABS
500.0000 mg | ORAL_TABLET | Freq: Two times a day (BID) | ORAL | 0 refills | Status: DC | PRN
  Filled 2022-11-23: qty 20, 10d supply, fill #0

## 2022-11-23 NOTE — Progress Notes (Signed)
BP 124/72   Pulse 90   Temp 98.1 F (36.7 C)   Resp 16   Ht 5' 3.5" (1.613 m)   Wt 204 lb 8 oz (92.8 kg)   SpO2 96%   BMI 35.66 kg/m    Subjective:    Patient ID: Julie Marquez, female    DOB: 1971-08-31, 52 y.o.   MRN: [id number]  HPI: Julie Marquez is a 52 y.o. female  Chief Complaint  Patient presents with   Back Pain   Right sided back pain: she says she has had right sided back pain for about a week.  She says it has gotten increasingly worse over the last 5 days. She says the pain increases with movement.  She says nothing she does makes it feel better.  She says the pain does not move or radiate.  She denies any injury. Discussed that it sounds like musculoskeletal.  Did discuss that her urine did show blood which could be a sign of a kidney stone.  She says she might be getting ready to start her period. She denies any urinary frequency. There is no CVA tenderness.  she says she has taken flexeril for the pain she says it did not really help.  Discussed treatment options. She agreed to treat as musculoskeletal.  Will give her an injection of Toradol and a prescription for robaxin and naproxen. Will also provide back exercises.  She can also use lidocaine patches and warm heat.    Relevant past medical, surgical, family and social history reviewed and updated as indicated. Interim medical history since our last visit reviewed. Allergies and medications reviewed and updated.  Review of Systems  Constitutional: Negative for fever or weight change.  Respiratory: Negative for cough and shortness of breath.   Cardiovascular: Negative for chest pain or palpitations.  Gastrointestinal: Negative for abdominal pain, no bowel changes.  Musculoskeletal: Negative for gait problem or joint swelling. Positive for right sided low back pain Skin: Negative for rash.  Neurological: Negative for dizziness or headache.  No other specific complaints in a complete review of systems  (except as listed in HPI above).      Objective:    BP 124/72   Pulse 90   Temp 98.1 F (36.7 C)   Resp 16   Ht 5' 3.5" (1.613 m)   Wt 204 lb 8 oz (92.8 kg)   SpO2 96%   BMI 35.66 kg/m   Wt Readings from Last 3 Encounters:  11/23/22 204 lb 8 oz (92.8 kg)  11/08/22 202 lb 11.2 oz (91.9 kg)  09/12/22 199 lb (90.3 kg)    Physical Exam  Constitutional: Patient appears well-developed and well-nourished. Obese  No distress.  HEENT: head atraumatic, normocephalic, pupils equal and reactive to light, neck supple Cardiovascular: Normal rate, regular rhythm and normal heart sounds.  No murmur heard. No BLE edema. Pulmonary/Chest: Effort normal and breath sounds normal. No respiratory distress. Abdominal: Soft.  There is no tenderness. NO CVA tenderness Psychiatric: Patient has a normal mood and affect. behavior is normal. Judgment and thought content normal.  Results for orders placed or performed in visit on 11/23/22  POCT urinalysis dipstick  Result Value Ref Range   Color, UA yellow    Clarity, UA clear    Glucose, UA Negative Negative   Bilirubin, UA negative    Ketones, UA negative    Spec Grav, UA 1.020 1.010 - 1.025   Blood, UA large    pH, UA  5.0 5.0 - 8.0   Protein, UA Positive (A) Negative   Urobilinogen, UA negative (A) 0.2 or 1.0 E.U./dL   Nitrite, UA negative    Leukocytes, UA Negative Negative   Appearance clear    Odor none       Assessment & Plan:   Problem List Items Addressed This Visit   None Visit Diagnoses     Acute low back pain, unspecified back pain laterality, unspecified whether sciatica present    -  Primary   stop flexerill, use robaxin and naproxen for pain, can use lidocaine patches and warm compresses. start doing back exercises   Relevant Medications   ketorolac (TORADOL) injection 60 mg (Completed)   naproxen (NAPROSYN) 500 MG tablet   methocarbamol (ROBAXIN) 500 MG tablet   Other Relevant Orders   POCT urinalysis dipstick (Completed)         Follow up plan: Return if symptoms worsen or fail to improve.

## 2022-11-25 ENCOUNTER — Encounter: Payer: Self-pay | Admitting: Nurse Practitioner

## 2022-12-30 ENCOUNTER — Other Ambulatory Visit: Payer: Self-pay

## 2022-12-30 ENCOUNTER — Other Ambulatory Visit: Payer: Self-pay | Admitting: Family Medicine

## 2022-12-30 DIAGNOSIS — E1129 Type 2 diabetes mellitus with other diabetic kidney complication: Secondary | ICD-10-CM

## 2023-01-05 ENCOUNTER — Encounter: Payer: Self-pay | Admitting: Family Medicine

## 2023-01-10 ENCOUNTER — Ambulatory Visit: Payer: No Typology Code available for payment source | Admitting: Family Medicine

## 2023-01-10 ENCOUNTER — Encounter: Payer: Self-pay | Admitting: Family Medicine

## 2023-01-10 ENCOUNTER — Ambulatory Visit (INDEPENDENT_AMBULATORY_CARE_PROVIDER_SITE_OTHER): Payer: 59 | Admitting: Family Medicine

## 2023-01-10 ENCOUNTER — Other Ambulatory Visit: Payer: Self-pay

## 2023-01-10 VITALS — BP 130/76 | HR 100 | Temp 97.8°F | Resp 16 | Ht 63.5 in | Wt 203.1 lb

## 2023-01-10 DIAGNOSIS — R809 Proteinuria, unspecified: Secondary | ICD-10-CM | POA: Diagnosis not present

## 2023-01-10 DIAGNOSIS — J329 Chronic sinusitis, unspecified: Secondary | ICD-10-CM | POA: Diagnosis not present

## 2023-01-10 DIAGNOSIS — R1310 Dysphagia, unspecified: Secondary | ICD-10-CM | POA: Diagnosis not present

## 2023-01-10 DIAGNOSIS — T887XXA Unspecified adverse effect of drug or medicament, initial encounter: Secondary | ICD-10-CM

## 2023-01-10 DIAGNOSIS — K219 Gastro-esophageal reflux disease without esophagitis: Secondary | ICD-10-CM | POA: Diagnosis not present

## 2023-01-10 DIAGNOSIS — E1129 Type 2 diabetes mellitus with other diabetic kidney complication: Secondary | ICD-10-CM | POA: Diagnosis not present

## 2023-01-10 MED ORDER — OZEMPIC (0.25 OR 0.5 MG/DOSE) 2 MG/3ML ~~LOC~~ SOPN
0.5000 mg | PEN_INJECTOR | SUBCUTANEOUS | 2 refills | Status: DC
  Filled 2023-01-10 – 2023-01-13 (×2): qty 3, 28d supply, fill #0

## 2023-01-10 MED ORDER — OZEMPIC (0.25 OR 0.5 MG/DOSE) 2 MG/3ML ~~LOC~~ SOPN
PEN_INJECTOR | SUBCUTANEOUS | 1 refills | Status: DC
  Filled 2023-01-10: qty 3, fill #0

## 2023-01-10 MED ORDER — FLUTICASONE PROPIONATE 50 MCG/ACT NA SUSP
2.0000 | Freq: Every day | NASAL | 6 refills | Status: DC
  Filled 2023-01-10: qty 16, 30d supply, fill #0
  Filled 2023-02-03: qty 16, 30d supply, fill #1
  Filled 2023-08-07: qty 16, 30d supply, fill #2

## 2023-01-10 MED ORDER — OMEPRAZOLE 40 MG PO CPDR
40.0000 mg | DELAYED_RELEASE_CAPSULE | Freq: Every day | ORAL | 0 refills | Status: DC
  Filled 2023-01-10: qty 14, 14d supply, fill #0

## 2023-01-10 NOTE — Progress Notes (Signed)
Patient ID: Julie Marquez, female    DOB: [birth year], 52 y.o.   MRN: [id number]  PCP: Delsa Grana, PA-C  Chief Complaint  Patient presents with   Mass    Lump in mid throat noticed last week has noticed its growing    Subjective:   Julie Marquez is a 52 y.o. female, presents to clinic with CC of the following:  HPI  Pt previously did OV to get back on ozempic after missing doses for weeks/months due to being out of town She restarted at a lower dose Since that [place] in Dec pt has sent messages about SE when restarting ozempic - GI sx and she was concerned about lump in throat She messaged about it again today and was asked to come in for an OV  Most recent OV A&P: Assessment & Plan:    1. Diabetes mellitus with microalbuminuria ([place]) History of being well-controlled with her last A1c about 3 months ago 5.6 She was traveling in another country and had been on and off of her Ozempic.  She was hesitant to restart at the 1 mg dose so she has not taken it for about 8 weeks She previously tolerated starting the medicine and increasing the dose without any concerning side effects or GI issues but she would like the lower dose pens to help her restart it without any sudden issues She has gained a little bit of weight in the last couple months We will restart her Ozempic for about 2 weeks of the 0.25 mg weekly dose then increased for 2 weeks to 0.5 mg weekly dose and then she can resume her 1 mg dose weekly Refill sent to the pharmacy Reviewed possible side effects -if side effects or severe or do not improve within a week she was encouraged to reduce the dose and let me know if I can change the prescriptions   We will plan on rechecking her labs and doing routine follow-up in about 3 months   - Semaglutide,0.25 or 0.5MG/DOS, (OZEMPIC, 0.25 OR 0.5 MG/DOSE,) 2 MG/3ML SOPN; Inject 0.25 mg into the skin once a week for 14 days, THEN 0.5 mg once a week for 14 days.  Dispense: 3 mL;  Refill: 0 - Semaglutide, 1 MG/DOSE, 4 MG/3ML SOPN; Inject 1 mg as directed once a week.  Dispense: 9 mL; Refill: 1   2. Abnormal TSH Recent Labs       Lab Results  Component Value Date    TSH 0.01 (L) 07/07/2022    TSH 0.04 (L) 04/19/2022    TSH 0.03 (L) 02/14/2022    TSH 0.46 06/03/2021    TSH 0.66 05/27/2020    TSH 0.791 12/10/2019      TSH had been suppressed over the last year and she was on a low-dose of levothyroxine because of worsening low TSH levothyroxine was stopped Reviewed chart - going back to 2017 per this EMR pt was on 88 mcg, however for the past several years she was only taking 25 mcg daily- stopped meds x 3+ months She overall feels good/euthyroid, recheck labs today they were previously ordered   3. Other specified hypothyroidism See #2     Return in about 3 months (around 02/07/2023) for HTN, DM, thyroid f/up in office with labs  Also last OV she had gained weight with travel and being off ozempic  Pt messages: Hi. I have been on Ozempic for some time now and I have increased to 1 mg and  have some adverse side effects. A lump in my throat that is still there with last dose on 12/31/2022 Still nausea all week So I am discontinuing the Ozempic.    Patient Active Problem List   Diagnosis Date Noted   History of colonic polyps    Current mild episode of major depressive disorder, unspecified whether recurrent ([place]) 07/07/2022   Polyp of colon    Excessive daytime sleepiness 07/09/2021   Mild intermittent asthma 07/14/2020   Mixed hyperlipidemia 04/18/2019   Compulsive gambling 12/03/2018   Diabetes mellitus with microalbuminuria (Julie Marquez) 08/09/2018   Class 1 obesity with serious comorbidity and body mass index (BMI) of 34.0 to 34.9 in adult 08/09/2018   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   Essential hypertension, benign 05/09/2018   Anxiety 01/11/2017   Vitamin D deficiency 01/11/2017   Fatty liver 08/10/2016   Hypothyroid 04/22/2016   Chronic neck pain  02/17/2016      Current Outpatient Medications:    albuterol (VENTOLIN HFA) 108 (90 Base) MCG/ACT inhaler, Inhale 2 puffs into the lungs every 6 (six) hours as needed for wheezing or shortness of breath., Disp: 18 g, Rfl: 2   atorvastatin (LIPITOR) 40 MG tablet, Take 1 tablet (40 mg total) by mouth at bedtime., Disp: 90 tablet, Rfl: 3   Blood Glucose Monitoring Suppl (FREESTYLE FREEDOM LITE) w/Device KIT, use to check blood sugars, Disp: 1 kit, Rfl: 0   cyclobenzaprine (FLEXERIL) 10 MG tablet, TAKE 1 TABLET BY MOUTH EVERY 8 HOURS AS NEEDED FOR MUSCLE SPASMS., Disp: 180 tablet, Rfl: 0   FLUoxetine (PROZAC) 40 MG capsule, TAKE 1 CAPSULE (40 MG TOTAL) BY MOUTH DAILY., Disp: 90 capsule, Rfl: 3   fluticasone (FLONASE) 50 MCG/ACT nasal spray, Place 2 sprays into both nostrils daily as needed., Disp: 48 g, Rfl: 3   glucose blood (ACCU-CHEK GUIDE) test strip, Check fingerstick blood sugars once a day if desired; LON 99 months; Dx E11.9, Disp: 100 each, Rfl: 0   glucose blood (FREESTYLE LITE) test strip, use daily as directed, Disp: 100 each, Rfl: 0   Lancets (FREESTYLE) lancets, Use daily as directed, Disp: 100 each, Rfl: 0   levothyroxine (SYNTHROID) 25 MCG tablet, Take 1 tablet (25 mcg total) by mouth daily before breakfast., Disp: 90 tablet, Rfl: 0   losartan (COZAAR) 50 MG tablet, TAKE 1 TABLET (50 MG TOTAL) BY MOUTH DAILY., Disp: 90 tablet, Rfl: 3   metFORMIN (GLUCOPHAGE-XR) 500 MG 24 hr tablet, Take 1 tablet (500 mg total) by mouth daily with breakfast., Disp: 90 tablet, Rfl: 3   methocarbamol (ROBAXIN) 500 MG tablet, Take 1 tablet (500 mg total) by mouth 2 (two) times daily as needed for muscle spasms., Disp: 20 tablet, Rfl: 0   naproxen (NAPROSYN) 500 MG tablet, Take 1 tablet (500 mg total) by mouth 2 (two) times daily with a meal., Disp: 20 tablet, Rfl: 0   PARAGARD INTRAUTERINE COPPER IU, by Intrauterine route., Disp: , Rfl:    ReliOn Ultra Thin Lancets MISC, Check fingerstick blood sugars  once a day if desired; LON 99 months, E11.9, Disp: 100 each, Rfl: 0   Semaglutide, 1 MG/DOSE, 4 MG/3ML SOPN, Inject 1 mg as directed once a week., Disp: 9 mL, Rfl: 1   Semaglutide, 1 MG/DOSE, 4 MG/3ML SOPN, Inject 1 mg as directed once a week., Disp: 9 mL, Rfl: 1   Allergies  Allergen Reactions   Lisinopril     cough     Social History   Tobacco Use   Smoking status:  Former    Packs/day: 0.25    Years: 20.00    Total pack years: 5.00    Types: Cigarettes    Quit date: 12/2021    Years since quitting: 1.0   Smokeless tobacco: Never  Vaping Use   Vaping Use: Every day  Substance Use Topics   Alcohol use: Not Currently    Comment: occas   Drug use: No      Chart Review Today: I personally reviewed active problem list, medication list, allergies, family history, social history, health maintenance, notes from last encounter, lab results, imaging with the patient/caregiver today.   Review of Systems  Constitutional: Negative.   HENT: Negative.    Eyes: Negative.   Respiratory: Negative.    Cardiovascular: Negative.   Gastrointestinal: Negative.   Endocrine: Negative.   Genitourinary: Negative.   Musculoskeletal: Negative.   Skin: Negative.   Allergic/Immunologic: Negative.   Neurological: Negative.   Hematological: Negative.   Psychiatric/Behavioral: Negative.    All other systems reviewed and are negative.      Objective:   Vitals:   01/10/23 1449  BP: 130/76  Pulse: 100  Resp: 16  Temp: 97.8 F (36.6 C)  TempSrc: Oral  SpO2: 98%  Weight: 203 lb 1.6 oz (92.1 kg)  Height: 5' 3.5" (1.613 m)    Body mass index is 35.41 kg/m.  Physical Exam Vitals and nursing note reviewed.  Constitutional:      General: She is not in acute distress.    Appearance: Normal appearance. She is well-developed. She is not ill-appearing, toxic-appearing or diaphoretic.  HENT:     Head: Normocephalic and atraumatic.     Right Ear: External ear normal.     Left Ear:  External ear normal.     Nose: Mucosal edema, congestion and rhinorrhea present. Rhinorrhea is clear.     Mouth/Throat:     Mouth: Mucous membranes are moist.     Pharynx: Oropharynx is clear. Uvula midline. No pharyngeal swelling, oropharyngeal exudate, posterior oropharyngeal erythema or uvula swelling.  Eyes:     General:        Right eye: No discharge.        Left eye: No discharge.     Conjunctiva/sclera: Conjunctivae normal.  Neck:     Thyroid: No thyroid mass, thyromegaly or thyroid tenderness.     Trachea: Trachea normal. No tracheal deviation.     Comments: Scratchy phonation and repeated throat clearing Cardiovascular:     Rate and Rhythm: Normal rate and regular rhythm.  Pulmonary:     Effort: Pulmonary effort is normal. No respiratory distress.     Breath sounds: No stridor.  Musculoskeletal:        General: Normal range of motion.  Skin:    General: Skin is warm and dry.     Findings: No rash.  Neurological:     Mental Status: She is alert.     Motor: No abnormal muscle tone.     Coordination: Coordination normal.  Psychiatric:        Behavior: Behavior normal.      Results for orders placed or performed in visit on 11/23/22  POCT urinalysis dipstick  Result Value Ref Range   Color, UA yellow    Clarity, UA clear    Glucose, UA Negative Negative   Bilirubin, UA negative    Ketones, UA negative    Spec Grav, UA 1.020 1.010 - 1.025   Blood, UA large    pH, UA 5.0  5.0 - 8.0   Protein, UA Positive (A) Negative   Urobilinogen, UA negative (A) 0.2 or 1.0 E.U./dL   Nitrite, UA negative    Leukocytes, UA Negative Negative   Appearance clear    Odor none        Assessment & Plan:     ICD-10-CM   1. Medication side effect  T88.7XXA    SE with ozempic 1.0 mg dose, return to 0.50 mg weekly dose for a few months    2. Diabetes mellitus with microalbuminuria ([facility])  E11.29 DISCONTINUED: Semaglutide,0.25 or 0.5MG/DOS, (OZEMPIC, 0.25 OR 0.5 MG/DOSE,) 2 MG/3ML  SOPN   R80.9    see above    3. Dysphagia, unspecified type  R13.10 DG ESOPHAGUS W SINGLE CM (SOL OR THIN BA)    omeprazole (PRILOSEC) 40 MG capsule   some choking on thin liquids, recent gerd and post nasal drip - tx both and if sx do not improve do swallowing study    4. Rhinosinusitis  J32.9 fluticasone (FLONASE) 50 MCG/ACT nasal spray   diffuse eryrthema and edema - tx with flonase and antihistamines    5. Gastroesophageal reflux disease, unspecified whether esophagitis present  K21.9 omeprazole (PRILOSEC) 40 MG capsule   2 week tx with PPI, we will do OV shortly after       Return for move appt to me the week before .   Delsa Grana, PA-C 01/10/23 2:55 PM

## 2023-01-13 ENCOUNTER — Other Ambulatory Visit: Payer: Self-pay

## 2023-01-15 ENCOUNTER — Other Ambulatory Visit: Payer: Self-pay

## 2023-01-16 ENCOUNTER — Other Ambulatory Visit: Payer: Self-pay

## 2023-01-16 ENCOUNTER — Other Ambulatory Visit (HOSPITAL_COMMUNITY): Payer: Self-pay

## 2023-02-02 ENCOUNTER — Other Ambulatory Visit: Payer: Self-pay

## 2023-02-03 ENCOUNTER — Other Ambulatory Visit: Payer: Self-pay

## 2023-02-09 ENCOUNTER — Ambulatory Visit: Payer: 59 | Admitting: Nurse Practitioner

## 2023-04-12 ENCOUNTER — Other Ambulatory Visit: Payer: Self-pay

## 2023-05-09 ENCOUNTER — Other Ambulatory Visit: Payer: Self-pay

## 2023-05-16 ENCOUNTER — Other Ambulatory Visit: Payer: Self-pay

## 2023-05-16 ENCOUNTER — Encounter: Payer: Self-pay | Admitting: Family Medicine

## 2023-05-16 ENCOUNTER — Ambulatory Visit (INDEPENDENT_AMBULATORY_CARE_PROVIDER_SITE_OTHER): Payer: 59 | Admitting: Family Medicine

## 2023-05-16 VITALS — BP 128/76 | HR 90 | Resp 16 | Ht 64.0 in | Wt 205.0 lb

## 2023-05-16 DIAGNOSIS — J029 Acute pharyngitis, unspecified: Secondary | ICD-10-CM | POA: Diagnosis not present

## 2023-05-16 DIAGNOSIS — R809 Proteinuria, unspecified: Secondary | ICD-10-CM | POA: Diagnosis not present

## 2023-05-16 DIAGNOSIS — E1129 Type 2 diabetes mellitus with other diabetic kidney complication: Secondary | ICD-10-CM | POA: Diagnosis not present

## 2023-05-16 DIAGNOSIS — Z7984 Long term (current) use of oral hypoglycemic drugs: Secondary | ICD-10-CM | POA: Diagnosis not present

## 2023-05-16 DIAGNOSIS — K1120 Sialoadenitis, unspecified: Secondary | ICD-10-CM | POA: Diagnosis not present

## 2023-05-16 LAB — POCT GLYCOSYLATED HEMOGLOBIN (HGB A1C): Hemoglobin A1C: 8.4 % — AB (ref 4.0–5.6)

## 2023-05-16 MED ORDER — FLUCONAZOLE 150 MG PO TABS
150.0000 mg | ORAL_TABLET | ORAL | 0 refills | Status: DC
  Filled 2023-05-16: qty 9, 18d supply, fill #0

## 2023-05-16 MED ORDER — DAPAGLIFLOZIN PRO-METFORMIN ER 10-1000 MG PO TB24
1.0000 | ORAL_TABLET | Freq: Every morning | ORAL | 0 refills | Status: DC
  Filled 2023-05-16: qty 90, 90d supply, fill #0

## 2023-05-16 NOTE — Progress Notes (Signed)
Name: Julie Marquez   MRN: [id number]    DOB: [birth year]   Date:05/16/2023       Progress Note  Subjective  Chief Complaint  Sore Throat  HPI  Sore throat : she states started a few days ago, initially pain only on the right side of the throat when swallowing, however is now also painful behind right ear.  No fever or chills.   Diabetes with microalbuminuria: she was on Ozempic but causes nausea, vomiting and headache and she stopped taking it a few months ago. She is still taking Metformin , she has not been checking glucose at home. She has polydipsia but not polyuria she also has polyphagia. She is on ARB and microalbuminuria improved. She has dyslipidemia and is compliant with atorvastatin Reminded her to follow up with Leisa and have labs repeated and also due for an eye exam  Today A1C is out of control at 8.4 %. We will try Xigduo in place of Metformin, discussed possible side effects of UTI and yeast infection and send diflucan to take prn   Patient Active Problem List   Diagnosis Date Noted   History of colonic polyps    Current mild episode of major depressive disorder, unspecified whether recurrent ([facility]) 07/07/2022   Polyp of colon    Excessive daytime sleepiness 07/09/2021   Mild intermittent asthma 07/14/2020   Mixed hyperlipidemia 04/18/2019   Compulsive gambling 12/03/2018   Diabetes mellitus with microalbuminuria ([facility]) 08/09/2018   Class 1 obesity with serious comorbidity and body mass index (BMI) of 34.0 to 34.9 in adult 08/09/2018   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   Essential hypertension, benign 05/09/2018   Anxiety 01/11/2017   Vitamin D deficiency 01/11/2017   Fatty liver 08/10/2016   Hypothyroid 04/22/2016   Chronic neck pain 02/17/2016    Past Surgical History:  Procedure Laterality Date   COLONOSCOPY WITH PROPOFOL N/A 09/08/2021   Procedure: COLONOSCOPY WITH PROPOFOL;  Surgeon: Pasty Spillers, MD;  Location: [facility] ENDOSCOPY;  Service: Endoscopy;   Laterality: N/A;   COLONOSCOPY WITH PROPOFOL N/A 09/12/2022   Procedure: COLONOSCOPY WITH PROPOFOL;  Surgeon: Toney Reil, MD;  Location[facility] ENDOSCOPY;  Service: Gastroenterology;  Laterality: N/A;   THYROIDECTOMY  2005    Family History  Problem Relation Age of Onset   Heart disease Father    Alcohol abuse Father    Cancer Maternal Grandmother        breast   Cancer Paternal Grandmother        breast   Breast cancer Paternal Grandmother    Dementia Mother    Thyroid disease Sister    Alcohol abuse Brother    Alcohol abuse Brother     Social History   Tobacco Use   Smoking status: Former    Packs/day: 0.25    Years: 20.00    Additional pack years: 0.00    Total pack years: 5.00    Types: Cigarettes    Quit date: 12/2021    Years since quitting: 1.3   Smokeless tobacco: Never  Substance Use Topics   Alcohol use: Not Currently    Comment: occas     Current Outpatient Medications:    albuterol (VENTOLIN HFA) 108 (90 Base) MCG/ACT inhaler, Inhale 2 puffs into the lungs every 6 (six) hours as needed for wheezing or shortness of breath., Disp: 18 g, Rfl: 2   atorvastatin (LIPITOR) 40 MG tablet, Take 1 tablet (40 mg total) by mouth at bedtime., Disp: 90 tablet, Rfl: 3  Blood Glucose Monitoring Suppl (FREESTYLE FREEDOM LITE) w/Device KIT, use to check blood sugars, Disp: 1 kit, Rfl: 0   cyclobenzaprine (FLEXERIL) 10 MG tablet, TAKE 1 TABLET BY MOUTH EVERY 8 HOURS AS NEEDED FOR MUSCLE SPASMS., Disp: 180 tablet, Rfl: 0   FLUoxetine (PROZAC) 40 MG capsule, TAKE 1 CAPSULE (40 MG TOTAL) BY MOUTH DAILY., Disp: 90 capsule, Rfl: 3   fluticasone (FLONASE) 50 MCG/ACT nasal spray, Place 2 sprays into both nostrils daily., Disp: 16 g, Rfl: 6   glucose blood (FREESTYLE LITE) test strip, use daily as directed, Disp: 100 each, Rfl: 0   Lancets (FREESTYLE) lancets, Use daily as directed, Disp: 100 each, Rfl: 0   losartan (COZAAR) 50 MG tablet, TAKE 1 TABLET (50 MG TOTAL) BY MOUTH  DAILY., Disp: 90 tablet, Rfl: 3   metFORMIN (GLUCOPHAGE-XR) 500 MG 24 hr tablet, Take 1 tablet (500 mg total) by mouth daily with breakfast., Disp: 90 tablet, Rfl: 3   PARAGARD INTRAUTERINE COPPER IU, by Intrauterine route., Disp: , Rfl:    levothyroxine (SYNTHROID) 25 MCG tablet, Take 1 tablet (25 mcg total) by mouth daily before breakfast. (Patient not taking: Reported on 05/16/2023), Disp: 90 tablet, Rfl: 0   methocarbamol (ROBAXIN) 500 MG tablet, Take 1 tablet (500 mg total) by mouth 2 (two) times daily as needed for muscle spasms. (Patient not taking: Reported on 05/16/2023), Disp: 20 tablet, Rfl: 0   naproxen (NAPROSYN) 500 MG tablet, Take 1 tablet (500 mg total) by mouth 2 (two) times daily with a meal. (Patient not taking: Reported on 05/16/2023), Disp: 20 tablet, Rfl: 0   Semaglutide,0.25 or 0.5MG /DOS, (OZEMPIC, 0.25 OR 0.5 MG/DOSE,) 2 MG/3ML SOPN, Inject 0.5 mg into the skin once a week. (Patient not taking: Reported on 05/16/2023), Disp: 3 mL, Rfl: 2  Allergies  Allergen Reactions   Lisinopril     cough    I personally reviewed active problem list, medication list, allergies, family history, social history, health maintenance with the patient/caregiver today.   ROS  Ten systems reviewed and is negative except as mentioned in HPI   Objective  Vitals:   05/16/23 1438  BP: 128/76  Pulse: 90  Resp: 16  SpO2: 95%  Weight: 205 lb (93 kg)  Height: 5\' 4"  (1.626 m)    Body mass index is 35.19 kg/m.  Physical Exam  Constitutional: Patient appears well-developed and well-nourished. Obese  No distress.  HEENT: head atraumatic, normocephalic, pupils equal and reactive to light, neck supple Cardiovascular: Normal rate, regular rhythm and normal heart sounds.  No murmur heard. No BLE edema. Pulmonary/Chest: Effort normal and breath sounds normal. No respiratory distress. Abdominal: Soft.  There is no tenderness. Psychiatric: Patient has a normal mood and affect. behavior is normal.  Judgment and thought content normal.   PHQ2/9:    05/16/2023    2:30 PM 01/10/2023    2:48 PM 11/23/2022    2:44 PM 11/08/2022   11:03 AM 07/07/2022   11:01 AM  Depression screen PHQ 2/9  Decreased Interest 0 0 0 1 0  Down, Depressed, Hopeless 0 0 0 1 0  PHQ - 2 Score 0 0 0 2 0  Altered sleeping 0 0 0 0 0  Tired, decreased energy 0 0 0 0 0  Change in appetite 0 0 0 0 0  Feeling bad or failure about yourself  0 0 0 0 0  Trouble concentrating 0 0 0 0 0  Moving slowly or fidgety/restless 0 0 0 0 0  Suicidal  thoughts 0 0 0 0 0  PHQ-9 Score 0 0 0 2 0  Difficult doing work/chores  Not difficult at all Not difficult at all Not difficult at all Not difficult at all    phq 9 is negative   Fall Risk:    05/16/2023    2:30 PM 01/10/2023    2:48 PM 11/23/2022    2:44 PM 11/08/2022   11:03 AM 07/07/2022   11:00 AM  Fall Risk   Falls in the past year? 0 0 0 0 0  Number falls in past yr: 0 0 0 0 0  Injury with Fall? 0 0 0 0 0  Risk for fall due to : No Fall Risks No Fall Risks   No Fall Risks  Follow up Falls prevention discussed Falls prevention discussed;Education provided;Falls evaluation completed   Falls prevention discussed;Education provided      Functional Status Survey: Is the patient deaf or have difficulty hearing?: No Does the patient have difficulty seeing, even when wearing glasses/contacts?: No Does the patient have difficulty concentrating, remembering, or making decisions?: No Does the patient have difficulty walking or climbing stairs?: No Does the patient have difficulty dressing or bathing?: No Does the patient have difficulty doing errands alone such as visiting a doctor's office or shopping?: No    Assessment & Plan  1. Sore throat  From sialadenitis   2. Diabetes mellitus with microalbuminuria (HCC)  - POCT HgB A1C - Dapagliflozin Pro-metFORMIN ER (XIGDUO XR) 08-999 MG TB24; Take 1 tablet by mouth in the morning.  Dispense: 90 tablet; Refill: 0 -  fluconazole (DIFLUCAN) 150 MG tablet; Take 1 tablet (150 mg total) by mouth every other day. Prn yeast  Dispense: 9 tablet; Refill: 0  3. Sialadenitis  Warm compresses, lemon drops, massage, and call back if no improvement or fever so we can send antibiotics

## 2023-05-16 NOTE — Patient Instructions (Signed)
Salivary Stone  A salivary stone is a small cluster of mineral (mineral deposit) that builds up in the tubes (ducts) that drain the salivary glands. Most salivary stones are made of calcium. When a stone forms, saliva can back up into the gland and cause painful swelling. Your salivary glands are the glands that make saliva. You have six major salivary glands. Each gland has a duct that carries saliva into your mouth. Saliva keeps your mouth moist and breaks down the food that you eat. It also helps prevent tooth decay. Two salivary glands are found just in front of your ears (parotid). The ducts for these glands open up inside your cheeks, near your back teeth. You also have two glands under your tongue (sublingual) and two glands under your jaw (submandibular). The ducts for these glands open under your tongue. A stone can form in any salivary gland. The most common place for a salivary stone to form is in a submandibular salivary gland. What are the causes? Salivary stones may be caused by any condition that lessens the flow of saliva. It is not known why some people get stones. What increases the risk? You are more likely to develop this condition if: You do not drink enough water. You smoke. You have any of these: High blood pressure. Gout. Diabetes. What are the signs or symptoms? The main sign of a salivary stone is sudden swelling of a salivary gland during eating. This usually happens under the jaw on one side. Other signs and symptoms may include: Swelling of the cheek or under the tongue during eating. Pain in the swollen area. Trouble chewing or swallowing. Swelling that goes down after eating. Sometimes, the salivary stone may be seen. The stone is oval in shape and may be white or yellow in color. How is this diagnosed? This condition may be diagnosed based on: Your signs and symptoms. A physical exam. In many cases, your health care provider will be able to feel the stone in a  duct inside your mouth. Imaging studies, such as: X-rays. Ultrasound. CT scan. MRI. You may need to see an ear, nose, and throat specialist (ENT or otolaryngologist) for diagnosis and treatment. How is this treated? Treatment for this condition depends on the size of the stone. A small stone that is not causing symptoms may be treated with home care. A stone that is large enough to cause symptoms may be treated by: Probing and widening of the duct to let the stone pass. Putting a thin, flexible scope (endoscope) into the duct to find and remove the stone. Breaking up the stone with sound waves. Removing the whole salivary gland. Follow these instructions at home: To relieve discomfort Take NSAIDs, such as ibuprofen, to help relieve pain and swelling as told by your health care provider. Follow these instructions every few hours: Suck on a lemon candy or a vitamin C lozenge to prompt the flow of saliva. Put a warm, damp cloth (warmcompress) over the gland. Gently massage the gland. General instructions  Take over-the-counter and prescription medicines only as told by your health care provider. Drink enough fluid to keep your urine pale yellow. Do not use any products that contain nicotine or tobacco. These products include cigarettes, chewing tobacco, and vaping devices, such as e-cigarettes. If you need help quitting, ask your health care provider. Keep all follow-up visits. This is important. Contact a health care provider if: You have pain and swelling in your face, jaw, or mouth after eating. You  keep having swelling in any of these places: In front of your ear. Under your jaw. Inside your mouth. Get help right away if: You have pain and swelling in your face, jaw, or mouth, that suddenly gets worse. Your pain and swelling make it hard to swallow, talk, or breathe. These symptoms may be an emergency. Get help right away. Call 911. Do not wait to see if the symptoms will go  away. Do not drive yourself to the hospital. Summary A salivary stone is a small clump of mineral (mineral deposit) that builds up in the ducts that drain your salivary glands. When a stone forms, saliva can back up into the gland and cause painful swelling. Salivary stones may be caused by any condition that lessens the flow of saliva. Treatment for this condition depends on the size of the stone. This information is not intended to replace advice given to you by your health care provider. Make sure you discuss any questions you have with your health care provider. Document Revised: 11/03/2021 Document Reviewed: 11/03/2021 Elsevier Patient Education  2024 [employer].

## 2023-05-17 ENCOUNTER — Other Ambulatory Visit: Payer: Self-pay

## 2023-05-19 ENCOUNTER — Other Ambulatory Visit: Payer: Self-pay

## 2023-05-29 ENCOUNTER — Other Ambulatory Visit: Payer: Self-pay | Admitting: Family Medicine

## 2023-05-29 ENCOUNTER — Encounter: Payer: Self-pay | Admitting: Family Medicine

## 2023-05-29 ENCOUNTER — Other Ambulatory Visit: Payer: Self-pay

## 2023-05-29 DIAGNOSIS — K1120 Sialoadenitis, unspecified: Secondary | ICD-10-CM

## 2023-05-29 MED ORDER — AMOXICILLIN-POT CLAVULANATE 875-125 MG PO TABS
1.0000 | ORAL_TABLET | Freq: Two times a day (BID) | ORAL | 0 refills | Status: DC
Start: 2023-05-29 — End: 2023-06-30
  Filled 2023-05-29: qty 14, 7d supply, fill #0

## 2023-06-06 ENCOUNTER — Other Ambulatory Visit: Payer: Self-pay | Admitting: Oncology

## 2023-06-06 DIAGNOSIS — Z006 Encounter for examination for normal comparison and control in clinical research program: Secondary | ICD-10-CM

## 2023-06-26 ENCOUNTER — Encounter: Payer: Self-pay | Admitting: Family Medicine

## 2023-06-30 ENCOUNTER — Other Ambulatory Visit: Payer: Self-pay

## 2023-06-30 ENCOUNTER — Encounter: Payer: Self-pay | Admitting: Nurse Practitioner

## 2023-06-30 ENCOUNTER — Ambulatory Visit: Payer: 59 | Admitting: Nurse Practitioner

## 2023-06-30 VITALS — BP 128/82 | HR 98 | Temp 98.2°F | Resp 16 | Ht 64.0 in | Wt 201.7 lb

## 2023-06-30 DIAGNOSIS — M62838 Other muscle spasm: Secondary | ICD-10-CM

## 2023-06-30 DIAGNOSIS — R7989 Other specified abnormal findings of blood chemistry: Secondary | ICD-10-CM | POA: Diagnosis not present

## 2023-06-30 DIAGNOSIS — Z1231 Encounter for screening mammogram for malignant neoplasm of breast: Secondary | ICD-10-CM

## 2023-06-30 DIAGNOSIS — I10 Essential (primary) hypertension: Secondary | ICD-10-CM | POA: Diagnosis not present

## 2023-06-30 DIAGNOSIS — Z0000 Encounter for general adult medical examination without abnormal findings: Secondary | ICD-10-CM

## 2023-06-30 DIAGNOSIS — E1129 Type 2 diabetes mellitus with other diabetic kidney complication: Secondary | ICD-10-CM | POA: Diagnosis not present

## 2023-06-30 DIAGNOSIS — R809 Proteinuria, unspecified: Secondary | ICD-10-CM

## 2023-06-30 DIAGNOSIS — Z7984 Long term (current) use of oral hypoglycemic drugs: Secondary | ICD-10-CM

## 2023-06-30 DIAGNOSIS — E782 Mixed hyperlipidemia: Secondary | ICD-10-CM

## 2023-06-30 LAB — CBC WITH DIFFERENTIAL/PLATELET
Absolute Monocytes: 581 cells/uL (ref 200–950)
Basophils Absolute: 31 cells/uL (ref 0–200)
Basophils Relative: 0.3 %
Eosinophils Absolute: 184 cells/uL (ref 15–500)
Eosinophils Relative: 1.8 %
HCT: 39.3 % (ref 35.0–45.0)
Hemoglobin: 12.8 g/dL (ref 11.7–15.5)
Lymphs Abs: 1989 cells/uL (ref 850–3900)
MCH: 26.9 pg — ABNORMAL LOW (ref 27.0–33.0)
MCHC: 32.6 g/dL (ref 32.0–36.0)
MCV: 82.7 fL (ref 80.0–100.0)
MPV: 12.1 fL (ref 7.5–12.5)
Monocytes Relative: 5.7 %
Neutro Abs: 7415 cells/uL (ref 1500–7800)
Neutrophils Relative %: 72.7 %
Platelets: 365 10*3/uL (ref 140–400)
RBC: 4.75 10*6/uL (ref 3.80–5.10)
RDW: 14.3 % (ref 11.0–15.0)
Total Lymphocyte: 19.5 %
WBC: 10.2 10*3/uL (ref 3.8–10.8)

## 2023-06-30 MED ORDER — CYCLOBENZAPRINE HCL 10 MG PO TABS
10.0000 mg | ORAL_TABLET | Freq: Three times a day (TID) | ORAL | 0 refills | Status: DC | PRN
  Filled 2023-06-30 – 2023-07-31 (×2): qty 180, 60d supply, fill #0

## 2023-06-30 MED ORDER — RYBELSUS 3 MG PO TABS
3.0000 mg | ORAL_TABLET | Freq: Every day | ORAL | Status: DC
Start: 2023-06-30 — End: 2023-08-07

## 2023-06-30 MED ORDER — METFORMIN HCL 500 MG PO TABS
1000.0000 mg | ORAL_TABLET | Freq: Every day | ORAL | Status: DC
Start: 2023-06-30 — End: 2023-08-07

## 2023-06-30 MED ORDER — FLUCONAZOLE 150 MG PO TABS
150.0000 mg | ORAL_TABLET | ORAL | 0 refills | Status: DC
Start: 2023-06-30 — End: 2023-08-10
  Filled 2023-06-30 – 2023-07-31 (×2): qty 9, 18d supply, fill #0

## 2023-06-30 NOTE — Progress Notes (Signed)
Name: Julie Marquez   MRN: [id number]    DOB: 1971/03/15   Date:06/30/2023       Progress Note  Subjective  Chief Complaint  Chief Complaint  Patient presents with   Annual Exam    HPI  Patient presents for annual CPE and acute issues.  Continued yeast infection.  Will stop xigduo. Restart metformin 1000 mg daily and start rybelsus.  Gave patient sample of 3 mg, she has some at home and will let me know what dosage. Also sent in diflucan for yeast treatment.    Diet: Regular, well balanced diet Exercise: 5 days 30 minutes, walks Sleep: 8 hrs Last dental exam:2023 Last eye exam: 2023  Flowsheet Row Office Visit from 06/30/2023 in [facility]  AUDIT-C Score 0      Depression: Phq 9 is  negative    06/30/2023    1:01 PM 05/16/2023    2:30 PM 01/10/2023    2:48 PM 11/23/2022    2:44 PM 11/08/2022   11:03 AM  Depression screen PHQ 2/9  Decreased Interest 0 0 0 0 1  Down, Depressed, Hopeless 0 0 0 0 1  PHQ - 2 Score 0 0 0 0 2  Altered sleeping 0 0 0 0 0  Tired, decreased energy 1 0 0 0 0  Change in appetite 0 0 0 0 0  Feeling bad or failure about yourself  0 0 0 0 0  Trouble concentrating 0 0 0 0 0  Moving slowly or fidgety/restless 0 0 0 0 0  Suicidal thoughts 0 0 0 0 0  PHQ-9 Score 1 0 0 0 2  Difficult doing work/chores Not difficult at all  Not difficult at all Not difficult at all Not difficult at all   Hypertension: BP Readings from Last 3 Encounters:  06/30/23 128/82  05/16/23 128/76  01/10/23 130/76   Obesity: Wt Readings from Last 3 Encounters:  06/30/23 201 lb 11.2 oz (91.5 kg)  05/16/23 205 lb (93 kg)  01/10/23 203 lb 1.6 oz (92.1 kg)   BMI Readings from Last 3 Encounters:  06/30/23 34.62 kg/m  05/16/23 35.19 kg/m  01/10/23 35.41 kg/m     Vaccines:  HPV: up to at age 59 , ask insurance if age between 57-45  Shingrix: 24-64 yo and ask insurance if covered when patient above 54 yo Pneumonia:  educated and discussed with  patient. Flu:  educated and discussed with patient.  Hep C Screening: completed STD testing and prevention (HIV/chl/gon/syphilis): completed Intimate partner violence:NO Sexual History : yes Menstrual History/LMP/Abnormal Bleeding: IUD, spotty period with IUD Incontinence Symptoms: NO  Breast cancer:  - Last Mammogram: due - BRCA gene screening: none  Osteoporosis: Discussed high calcium and vitamin D supplementation, weight bearing exercises  Cervical cancer screening: 07/07/2022  Skin cancer: Discussed monitoring for atypical lesions  Colorectal cancer: 09/12/2022   Lung cancer:   Low Dose CT Chest recommended if Age 33-80 years, 20 pack-year currently smoking OR have quit w/in 15years. Patient does not qualify.   ECG: 01/27/2020  Advanced Care Planning: A voluntary discussion about advance care planning including the explanation and discussion of advance directives.  Discussed health care proxy and Living will, and the patient was able to identify a health care proxy as husband.  Patient does not have a living will at present time. If patient does have living will, I have requested they bring this to the clinic to be scanned in to their chart.  Lipids:  Lab Results  Component Value Date   CHOL 153 02/14/2022   CHOL 157 06/03/2021   CHOL 163 05/27/2020   Lab Results  Component Value Date   HDL 52 02/14/2022   HDL 52 06/03/2021   HDL 52 05/27/2020   Lab Results  Component Value Date   LDLCALC 66 02/14/2022   LDLCALC 71 06/03/2021   LDLCALC 82 05/27/2020   Lab Results  Component Value Date   TRIG 293 (H) 02/14/2022   TRIG 246 (H) 06/03/2021   TRIG 193 (H) 05/27/2020   Lab Results  Component Value Date   CHOLHDL 2.9 02/14/2022   CHOLHDL 3.0 06/03/2021   CHOLHDL 3.1 05/27/2020   No results found for: "LDLDIRECT"  Glucose: Glucose, Bld  Date Value Ref Range Status  07/07/2022 79 65 - 99 mg/dL Final    Comment:    .            Fasting reference interval .    04/19/2022 156 (H) 65 - 99 mg/dL Final    Comment:    .            Fasting reference interval . For someone without known diabetes, a glucose value >125 mg/dL indicates that they may have diabetes and this should be confirmed with a follow-up test. .   02/14/2022 70 65 - 99 mg/dL Final    Comment:    .            Fasting reference interval .    Glucose-Capillary  Date Value Ref Range Status  09/12/2022 145 (H) 70 - 99 mg/dL Final    Comment:    Glucose reference range applies only to samples taken after fasting for at least 8 hours.  09/08/2021 92 70 - 99 mg/dL Final    Comment:    Glucose reference range applies only to samples taken after fasting for at least 8 hours.    Patient Active Problem List   Diagnosis Date Noted   Sialadenitis 05/16/2023   History of colonic polyps    Current mild episode of major depressive disorder, unspecified whether recurrent (HCC) 07/07/2022   Polyp of colon    Excessive daytime sleepiness 07/09/2021   Mild intermittent asthma 07/14/2020   Mixed hyperlipidemia 04/18/2019   Compulsive gambling 12/03/2018   Diabetes mellitus with microalbuminuria (HCC) 08/09/2018   Class 1 obesity with serious comorbidity and body mass index (BMI) of 34.0 to 34.9 in adult 08/09/2018   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   Essential hypertension, benign 05/09/2018   Anxiety 01/11/2017   Vitamin D deficiency 01/11/2017   Fatty liver 08/10/2016   Hypothyroid 04/22/2016   Chronic neck pain 02/17/2016    Past Surgical History:  Procedure Laterality Date   COLONOSCOPY WITH PROPOFOL N/A 09/08/2021   Procedure: COLONOSCOPY WITH PROPOFOL;  Surgeon: Pasty Spillers, MD;  Location: [facility] ENDOSCOPY;  Service: Endoscopy;  Laterality: N/A;   COLONOSCOPY WITH PROPOFOL N/A 09/12/2022   Procedure: COLONOSCOPY WITH PROPOFOL;  Surgeon: Toney Reil, MD;  Location: [facility] ENDOSCOPY;  Service: Gastroenterology;  Laterality: N/A;   THYROIDECTOMY  2005     Family History  Problem Relation Age of Onset   Heart disease Father    Alcohol abuse Father    Cancer Maternal Grandmother        breast   Cancer Paternal Grandmother        breast   Breast cancer Paternal Grandmother    Dementia Mother    Thyroid disease Sister    Alcohol  abuse Brother    Alcohol abuse Brother     Social History   Socioeconomic History   Marital status: Married    Spouse name: robert   Number of children: 1   Years of education: Not on file   Highest education level: Associate degree: occupational, Scientist, product/process development, or vocational program  Occupational History   Not on file  Tobacco Use   Smoking status: Former    Current packs/day: 0.00    Average packs/day: 0.3 packs/day for 20.0 years (5.0 ttl pk-yrs)    Types: Cigarettes    Start date: 12/2001    Quit date: 12/2021    Years since quitting: 1.5   Smokeless tobacco: Never  Vaping Use   Vaping status: Every Day   Substances: Nicotine, Flavoring  Substance and Sexual Activity   Alcohol use: Not Currently    Comment: occas   Drug use: No   Sexual activity: Yes    Birth control/protection: I.U.D.    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   Social Determinants of Health   Financial Resource Strain: Low Risk  (06/30/2023)   Overall Financial Resource Strain (CARDIA)    Difficulty of Paying Living Expenses: Not hard at all  Food Insecurity: No Food Insecurity (06/30/2023)   Hunger Vital Sign    Worried About Running Out of Food in the Last Year: Never true    Ran Out of Food in the Last Year: Never true  Transportation Needs: No Transportation Needs (06/30/2023)   PRAPARE - Administrator, Civil Service (Medical): No    Lack of Transportation (Non-Medical): No  Physical Activity: Sufficiently Active (06/30/2023)   Exercise Vital Sign    Days of Exercise per Week: 5 days    Minutes of Exercise per Session: 30  min  Stress: No Stress Concern Present (06/30/2023)   [employer] of Occupational Health - Occupational Stress Questionnaire    Feeling of Stress : Only a little  Social Connections: Moderately Isolated (06/30/2023)   Social Connection and Isolation Panel [NHANES]    Frequency of Communication with Friends and Family: More than three times a week    Frequency of Social Gatherings with Friends and Family: More than three times a week    Attends Religious Services: Never    Database administrator or Organizations: No    Attends Banker Meetings: Never    Marital Status: Married  Catering manager Violence: Not At Risk (06/30/2023)   Humiliation, Afraid, Rape, and Kick questionnaire    Fear of Current or Ex-Partner: No    Emotionally Abused: No    Physically Abused: No    Sexually Abused: No     Current Outpatient Medications:    albuterol (VENTOLIN HFA) 108 (90 Base) MCG/ACT inhaler, Inhale 2 puffs into the lungs every 6 (six) hours as needed for wheezing or shortness of breath., Disp: 18 g, Rfl: 2   atorvastatin (LIPITOR) 40 MG tablet, Take 1 tablet (40 mg total) by mouth at bedtime., Disp: 90 tablet, Rfl: 3   FLUoxetine (PROZAC) 40 MG capsule, TAKE 1 CAPSULE (40 MG TOTAL) BY MOUTH DAILY., Disp: 90 capsule, Rfl: 3   fluticasone (FLONASE) 50 MCG/ACT nasal spray, Place 2 sprays into both nostrils daily., Disp: 16 g, Rfl: 6   metFORMIN (GLUCOPHAGE) 500 MG tablet, Take 2 tablets (1,000 mg total) by mouth daily with breakfast., Disp: ,  Rfl:    Semaglutide (RYBELSUS) 3 MG TABS, Take 1 tablet (3 mg total) by mouth daily., Disp: , Rfl:    cyclobenzaprine (FLEXERIL) 10 MG tablet, TAKE 1 TABLET BY MOUTH EVERY 8 HOURS AS NEEDED FOR MUSCLE SPASMS., Disp: 180 tablet, Rfl: 0   fluconazole (DIFLUCAN) 150 MG tablet, Take 1 tablet (150 mg total) by mouth every other day. as needed for yeast, Disp: 9 tablet, Rfl: 0  Allergies  Allergen Reactions   Lisinopril     cough      ROS  Constitutional: Negative for fever or weight change.  Respiratory: Negative for cough and shortness of breath.   Cardiovascular: Negative for chest pain or palpitations.  Gastrointestinal: Negative for abdominal pain, no bowel changes.  Musculoskeletal: Negative for gait problem or joint swelling.  Skin: Negative for rash.  Neurological: Negative for dizziness or headache.  No other specific complaints in a complete review of systems (except as listed in HPI above).   Objective  Vitals:   06/30/23 1302  BP: 128/82  Pulse: 98  Resp: 16  Temp: 98.2 F (36.8 C)  TempSrc: Oral  SpO2: 97%  Weight: 201 lb 11.2 oz (91.5 kg)  Height: 5\' 4"  (1.626 m)    Body mass index is 34.62 kg/m.  Physical Exam Constitutional: Patient appears well-developed and well-nourished. No distress.  HENT: Head: Normocephalic and atraumatic. Ears: B TMs ok, no erythema or effusion; Nose: Nose normal. Mouth/Throat: Oropharynx is clear and moist. No oropharyngeal exudate.  Eyes: Conjunctivae and EOM are normal. Pupils are equal, round, and reactive to light. No scleral icterus.  Neck: Normal range of motion. Neck supple. No JVD present. No thyromegaly present.  Cardiovascular: Normal rate, regular rhythm and normal heart sounds.  No murmur heard. No BLE edema. Pulmonary/Chest: Effort normal and breath sounds normal. No respiratory distress. Abdominal: Soft. Bowel sounds are normal, no distension. There is no tenderness. no masses Breast: no lumps or masses, no nipple discharge or rashes Musculoskeletal: Normal range of motion, no joint effusions. No gross deformities Neurological: he is alert and oriented to person, place, and time. No cranial nerve deficit. Coordination, balance, strength, speech and gait are normal.  Skin: Skin is warm and dry. No rash noted. No erythema.  Psychiatric: Patient has a normal mood and affect. behavior is normal. Judgment and thought content normal.   Recent  Results (from the past 2160 hour(s))  POCT HgB A1C     Status: Abnormal   Collection Time: 05/16/23  3:17 PM  Result Value Ref Range   Hemoglobin A1C 8.4 (A) 4.0 - 5.6 %   HbA1c POC (<> result, manual entry)     HbA1c, POC (prediabetic range)     HbA1c, POC (controlled diabetic range)      Diabetic Foot Exam: Diabetic Foot Exam - Simple   Simple Foot Form Diabetic Foot exam was performed with the following findings: Yes 06/30/2023  1:08 PM  Visual Inspection No deformities, no ulcerations, no other skin breakdown bilaterally: Yes Sensation Testing Intact to touch and monofilament testing bilaterally: Yes Pulse Check Posterior Tibialis and Dorsalis pulse intact bilaterally: Yes Comments      Fall Risk:    06/30/2023    1:00 PM 05/16/2023    2:30 PM 01/10/2023    2:48 PM 11/23/2022    2:44 PM 11/08/2022   11:03 AM  Fall Risk   Falls in the past year? 0 0 0 0 0  Number falls in past yr: 0 0 0 0  0  Injury with Fall? 0 0 0 0 0  Risk for fall due to : No Fall Risks No Fall Risks No Fall Risks    Follow up  Falls prevention discussed Falls prevention discussed;Education provided;Falls evaluation completed       Functional Status Survey: Is the patient deaf or have difficulty hearing?: No Does the patient have difficulty seeing, even when wearing glasses/contacts?: No Does the patient have difficulty concentrating, remembering, or making decisions?: No Does the patient have difficulty walking or climbing stairs?: No Does the patient have difficulty dressing or bathing?: No Does the patient have difficulty doing errands alone such as visiting a doctor's office or shopping?: No   Assessment & Plan  1. Annual physical exam  - CBC with Differential/Platelet - COMPLETE METABOLIC PANEL WITH GFR - Lipid panel - Thyroid Panel With TSH - Microalbumin / creatinine urine ratio - HM Diabetes Foot Exam - MM 3D SCREENING MAMMOGRAM BILATERAL BREAST; Future  2. Mixed  hyperlipidemia  - Lipid panel  3. Essential hypertension, benign  - CBC with Differential/Platelet - COMPLETE METABOLIC PANEL WITH GFR  4. Abnormal TSH  - Thyroid Panel With TSH  5. Diabetes mellitus with microalbuminuria (HCC) Stop xigduo due to recurrent yeast infection Start metformin and rybelsus - COMPLETE METABOLIC PANEL WITH GFR - Microalbumin / creatinine urine ratio - HM Diabetes Foot Exam - fluconazole (DIFLUCAN) 150 MG tablet; Take 1 tablet (150 mg total) by mouth every other day. as needed for yeast  Dispense: 9 tablet; Refill: 0 - Semaglutide (RYBELSUS) 3 MG TABS; Take 1 tablet (3 mg total) by mouth daily. - metFORMIN (GLUCOPHAGE) 500 MG tablet; Take 2 tablets (1,000 mg total) by mouth daily with breakfast.  6. Encounter for screening mammogram for malignant neoplasm of breast  - MM 3D SCREENING MAMMOGRAM BILATERAL BREAST; Future  7. Muscle spasms of neck -refill sent - cyclobenzaprine (FLEXERIL) 10 MG tablet; TAKE 1 TABLET BY MOUTH EVERY 8 HOURS AS NEEDED FOR MUSCLE SPASMS.  Dispense: 180 tablet; Refill: 0    -USPSTF grade A and B recommendations reviewed with patient; age-appropriate recommendations, preventive care, screening tests, etc discussed and encouraged; healthy living encouraged; see AVS for patient education given to patient -Discussed importance of 150 minutes of physical activity weekly, eat two servings of fish weekly, eat one serving of tree nuts ( cashews, pistachios, pecans, almonds.Marland Kitchen) every other day, eat 6 servings of fruit/vegetables daily and drink plenty of water and avoid sweet beverages.   -Reviewed Health Maintenance: yes

## 2023-07-01 ENCOUNTER — Encounter: Payer: Self-pay | Admitting: Nurse Practitioner

## 2023-07-19 ENCOUNTER — Other Ambulatory Visit: Payer: Self-pay

## 2023-07-31 ENCOUNTER — Other Ambulatory Visit: Payer: Self-pay | Admitting: Family Medicine

## 2023-07-31 ENCOUNTER — Other Ambulatory Visit: Payer: Self-pay

## 2023-07-31 DIAGNOSIS — R809 Proteinuria, unspecified: Secondary | ICD-10-CM

## 2023-07-31 DIAGNOSIS — E782 Mixed hyperlipidemia: Secondary | ICD-10-CM

## 2023-07-31 MED ORDER — ATORVASTATIN CALCIUM 40 MG PO TABS
40.0000 mg | ORAL_TABLET | Freq: Every day | ORAL | 3 refills | Status: DC
Start: 2023-07-31 — End: 2023-10-26
  Filled 2023-07-31: qty 90, 90d supply, fill #0
  Filled 2023-08-07: qty 90, 90d supply, fill #1

## 2023-08-01 ENCOUNTER — Other Ambulatory Visit: Payer: Self-pay

## 2023-08-07 ENCOUNTER — Other Ambulatory Visit: Payer: Self-pay | Admitting: Nurse Practitioner

## 2023-08-07 ENCOUNTER — Other Ambulatory Visit: Payer: Self-pay

## 2023-08-07 ENCOUNTER — Encounter: Payer: Self-pay | Admitting: Nurse Practitioner

## 2023-08-07 ENCOUNTER — Other Ambulatory Visit: Payer: Self-pay | Admitting: Family Medicine

## 2023-08-07 DIAGNOSIS — E1129 Type 2 diabetes mellitus with other diabetic kidney complication: Secondary | ICD-10-CM

## 2023-08-07 DIAGNOSIS — E782 Mixed hyperlipidemia: Secondary | ICD-10-CM

## 2023-08-07 DIAGNOSIS — F419 Anxiety disorder, unspecified: Secondary | ICD-10-CM

## 2023-08-07 DIAGNOSIS — J4540 Moderate persistent asthma, uncomplicated: Secondary | ICD-10-CM

## 2023-08-07 MED ORDER — RYBELSUS 7 MG PO TABS
7.0000 mg | ORAL_TABLET | Freq: Every day | ORAL | 0 refills | Status: AC
Start: 2023-08-07 — End: ?
  Filled 2023-08-07: qty 30, 30d supply, fill #0
  Filled 2023-11-03: qty 30, 30d supply, fill #1
  Filled 2024-01-12: qty 30, 30d supply, fill #2

## 2023-08-07 MED ORDER — FLUOXETINE HCL 40 MG PO CAPS
ORAL_CAPSULE | Freq: Every day | ORAL | 3 refills | Status: DC
  Filled 2023-08-07: qty 90, 90d supply, fill #0

## 2023-08-07 MED ORDER — FENOFIBRATE 48 MG PO TABS
48.0000 mg | ORAL_TABLET | Freq: Every day | ORAL | 0 refills | Status: DC
Start: 2023-08-07 — End: 2023-10-26
  Filled 2023-08-07: qty 90, 90d supply, fill #0

## 2023-08-07 MED ORDER — METFORMIN HCL 500 MG PO TABS
1000.0000 mg | ORAL_TABLET | Freq: Every day | ORAL | 0 refills | Status: DC
Start: 2023-08-07 — End: 2023-10-26
  Filled 2023-08-07: qty 180, 90d supply, fill #0

## 2023-08-07 MED ORDER — ALBUTEROL SULFATE HFA 108 (90 BASE) MCG/ACT IN AERS
2.0000 | INHALATION_SPRAY | Freq: Four times a day (QID) | RESPIRATORY_TRACT | 2 refills | Status: DC | PRN
  Filled 2023-08-07: qty 6.7, 25d supply, fill #0

## 2023-08-08 ENCOUNTER — Other Ambulatory Visit: Payer: Self-pay

## 2023-08-08 ENCOUNTER — Other Ambulatory Visit: Payer: Self-pay | Admitting: Nurse Practitioner

## 2023-08-08 DIAGNOSIS — E1129 Type 2 diabetes mellitus with other diabetic kidney complication: Secondary | ICD-10-CM

## 2023-08-08 NOTE — Telephone Encounter (Signed)
Requested medication (s) are due for refill today: yes  Requested medication (s) are on the active medication list: yes  Last refill:  06/30/23 #9 0 refills  Future visit scheduled: yes in 1 month  Notes to clinic:  medication not assigned to a protocol. Do you want to refill Rx?     Requested Prescriptions  Pending Prescriptions Disp Refills   fluconazole (DIFLUCAN) 150 MG tablet 9 tablet 0    Sig: Take 1 tablet (150 mg total) by mouth every other day. as needed for yeast     Off-Protocol Failed - 08/07/2023 11:27 AM      Failed - Medication not assigned to a protocol, review manually.      Passed - Valid encounter within last 12 months    Recent Outpatient Visits           1 month ago Annual physical exam   [facility] Berniece Salines, FNP   2 months ago Sore throat   [facility] Alba Cory, MD   7 months ago Medication side effect   [facility] Danelle Berry, PA-C   8 months ago Acute low back pain, unspecified back pain laterality, unspecified whether sciatica present   [facility] Berniece Salines, FNP   9 months ago Diabetes mellitus with microalbuminuria [facility])   [facility] Danelle Berry, PA-C       Future Appointments             In 1 month Danelle Berry, PA-C [facility]

## 2023-08-10 ENCOUNTER — Other Ambulatory Visit: Payer: Self-pay

## 2023-08-10 ENCOUNTER — Other Ambulatory Visit (HOSPITAL_BASED_OUTPATIENT_CLINIC_OR_DEPARTMENT_OTHER): Payer: Self-pay

## 2023-08-10 MED FILL — Fluconazole Tab 150 MG: ORAL | 18 days supply | Qty: 9 | Fill #0 | Status: CN

## 2023-08-10 NOTE — Telephone Encounter (Signed)
Requested medication (s) are due for refill today:   Requested medication (s) are on the active medication list: Yes  Last refill:  06/30/23  Future visit scheduled: Yes  Notes to clinic:  Manual review.    Requested Prescriptions  Pending Prescriptions Disp Refills   fluconazole (DIFLUCAN) 150 MG tablet 9 tablet 0    Sig: Take 1 tablet (150 mg total) by mouth every other day. as needed for yeast     Off-Protocol Failed - 08/08/2023  2:43 PM      Failed - Medication not assigned to a protocol, review manually.      Passed - Valid encounter within last 12 months    Recent Outpatient Visits           1 month ago Annual physical exam   [facility] Berniece Salines, FNP   2 months ago Sore throat   [facility] Alba Cory, MD   7 months ago Medication side effect   [facility] Danelle Berry, PA-C   8 months ago Acute low back pain, unspecified back pain laterality, unspecified whether sciatica present   [facility] Berniece Salines, FNP   9 months ago Diabetes mellitus with microalbuminuria [facility])   [facility] Danelle Berry, PA-C       Future Appointments             In 1 month Danelle Berry, PA-C [facility]

## 2023-08-16 ENCOUNTER — Other Ambulatory Visit: Payer: Self-pay

## 2023-08-17 ENCOUNTER — Ambulatory Visit: Payer: Self-pay

## 2023-08-17 NOTE — Telephone Encounter (Signed)
  Chief Complaint: Fall - Hip pain, also ankle and knee pain, scraped knee and nose Symptoms: Above Frequency: Today after fall Pertinent Negatives: Patient denies  Disposition: [] ED /[x] Urgent Care (no appt availability in office) / [] Appointment(In office/virtual)/ []  [facility]/ [] Home Care/ [] Refused Recommended Disposition /[] [facility]/ []  Follow-up with PCP Additional Notes: Pt slipped on her front step and "face planted". She skinned her nose and knee. She states her hip is quite painful. No appts in office advised [facility] or UC. Pt will go to UC     Reason for Disposition  [1] No prior tetanus shots (or is not fully vaccinated) AND [2] any wound (e.g., cut or scrape)  Answer Assessment - Initial Assessment Questions 1. MECHANISM: "How did the fall happen?"     Shoe slipped on step - face planted 2. DOMESTIC VIOLENCE AND ELDER ABUSE SCREENING: "Did you fall because someone pushed you or tried to hurt you?" If Yes, ask: "Are you safe now?"     *No Answer* 3. ONSET: "When did the fall happen?" (e.g., minutes, hours, or days ago)     *No Answer* 4. LOCATION: "What part of the body hit the ground?" (e.g., back, buttocks, head, hips, knees, hands, head, stomach)     Nose is skinned. Hip is tender, and hurts to move. Knee is tender 5. INJURY: "Did you hurt (injure) yourself when you fell?" If Yes, ask: "What did you injure? Tell me more about this?" (e.g., body area; type of injury; pain severity)"     *No Answer* 6. PAIN: "Is there any pain?" If Yes, ask: "How bad is the pain?" (e.g., Scale 1-10; or mild,  moderate, severe)   - NONE (0): No pain   - MILD (1-3): Doesn't interfere with normal activities    - MODERATE (4-7): Interferes with normal activities or awakens from sleep    - SEVERE (8-10): Excruciating pain, unable to do any normal activities      Mild moderate 7. SIZE: For cuts, bruises, or swelling, ask: "How large is it?" (e.g., inches or  centimeters)      Skinned knee, Skinned nose 8. PREGNANCY: "Is there any chance you are pregnant?" "When was your last menstrual period?"     *No Answer* 9. OTHER SYMPTOMS: "Do you have any other symptoms?" (e.g., dizziness, fever, weakness; new onset or worsening).      *No Answer* 10. CAUSE: "What do you think caused the fall (or falling)?" (e.g., tripped, dizzy spell)       *No Answer*  Protocols used: Falls and [facility]

## 2023-08-23 ENCOUNTER — Other Ambulatory Visit: Payer: Self-pay

## 2023-08-23 ENCOUNTER — Encounter: Payer: Self-pay | Admitting: Nurse Practitioner

## 2023-08-24 ENCOUNTER — Other Ambulatory Visit: Payer: Self-pay

## 2023-08-24 ENCOUNTER — Other Ambulatory Visit: Payer: Self-pay | Admitting: Nurse Practitioner

## 2023-08-24 DIAGNOSIS — I10 Essential (primary) hypertension: Secondary | ICD-10-CM

## 2023-08-24 MED ORDER — LOSARTAN POTASSIUM 50 MG PO TABS
50.0000 mg | ORAL_TABLET | Freq: Every day | ORAL | 1 refills | Status: DC
Start: 2023-08-24 — End: 2023-08-28
  Filled 2023-08-24: qty 90, 90d supply, fill #0

## 2023-08-28 ENCOUNTER — Other Ambulatory Visit: Payer: Self-pay | Admitting: Nurse Practitioner

## 2023-08-28 ENCOUNTER — Other Ambulatory Visit: Payer: Self-pay

## 2023-08-28 DIAGNOSIS — I10 Essential (primary) hypertension: Secondary | ICD-10-CM

## 2023-08-28 MED ORDER — LOSARTAN POTASSIUM 50 MG PO TABS
50.0000 mg | ORAL_TABLET | Freq: Every day | ORAL | 1 refills | Status: DC
Start: 2023-08-28 — End: 2023-10-26
  Filled 2023-08-28 – 2023-08-29 (×2): qty 90, 90d supply, fill #0

## 2023-08-29 ENCOUNTER — Other Ambulatory Visit: Payer: Self-pay

## 2023-09-11 ENCOUNTER — Other Ambulatory Visit: Payer: Self-pay

## 2023-09-11 ENCOUNTER — Other Ambulatory Visit: Payer: Self-pay | Admitting: Family Medicine

## 2023-09-20 ENCOUNTER — Other Ambulatory Visit: Payer: Self-pay

## 2023-10-03 ENCOUNTER — Ambulatory Visit: Payer: 59 | Admitting: Physician Assistant

## 2023-10-10 DIAGNOSIS — E119 Type 2 diabetes mellitus without complications: Secondary | ICD-10-CM | POA: Diagnosis not present

## 2023-10-10 DIAGNOSIS — H5203 Hypermetropia, bilateral: Secondary | ICD-10-CM | POA: Diagnosis not present

## 2023-10-23 ENCOUNTER — Other Ambulatory Visit
Admission: RE | Admit: 2023-10-23 | Discharge: 2023-10-23 | Disposition: A | Discharge status: Home / Self Care | Payer: Self-pay | Source: Ambulatory Visit | Attending: Oncology | Admitting: Oncology

## 2023-10-23 DIAGNOSIS — Z006 Encounter for examination for normal comparison and control in clinical research program: Secondary | ICD-10-CM | POA: Insufficient documentation

## 2023-10-26 ENCOUNTER — Ambulatory Visit: Payer: 59 | Admitting: Nurse Practitioner

## 2023-10-26 ENCOUNTER — Encounter: Payer: Self-pay | Admitting: Nurse Practitioner

## 2023-10-26 ENCOUNTER — Other Ambulatory Visit: Payer: Self-pay

## 2023-10-26 ENCOUNTER — Ambulatory Visit
Admission: RE | Admit: 2023-10-26 | Discharge: 2023-10-26 | Disposition: A | Discharge status: Home / Self Care | Payer: 59 | Source: Ambulatory Visit | Attending: Nurse Practitioner | Admitting: Nurse Practitioner

## 2023-10-26 VITALS — BP 122/72 | HR 92 | Temp 98.1°F | Resp 14 | Ht 64.0 in | Wt 201.5 lb

## 2023-10-26 DIAGNOSIS — J329 Chronic sinusitis, unspecified: Secondary | ICD-10-CM | POA: Diagnosis not present

## 2023-10-26 DIAGNOSIS — E782 Mixed hyperlipidemia: Secondary | ICD-10-CM | POA: Diagnosis not present

## 2023-10-26 DIAGNOSIS — Z6834 Body mass index (BMI) 34.0-34.9, adult: Secondary | ICD-10-CM

## 2023-10-26 DIAGNOSIS — J4520 Mild intermittent asthma, uncomplicated: Secondary | ICD-10-CM

## 2023-10-26 DIAGNOSIS — G8929 Other chronic pain: Secondary | ICD-10-CM

## 2023-10-26 DIAGNOSIS — I10 Essential (primary) hypertension: Secondary | ICD-10-CM

## 2023-10-26 DIAGNOSIS — M542 Cervicalgia: Secondary | ICD-10-CM

## 2023-10-26 DIAGNOSIS — M62838 Other muscle spasm: Secondary | ICD-10-CM | POA: Diagnosis not present

## 2023-10-26 DIAGNOSIS — R809 Proteinuria, unspecified: Secondary | ICD-10-CM

## 2023-10-26 DIAGNOSIS — E1129 Type 2 diabetes mellitus with other diabetic kidney complication: Secondary | ICD-10-CM

## 2023-10-26 DIAGNOSIS — F320 Major depressive disorder, single episode, mild: Secondary | ICD-10-CM

## 2023-10-26 DIAGNOSIS — R7989 Other specified abnormal findings of blood chemistry: Secondary | ICD-10-CM | POA: Diagnosis not present

## 2023-10-26 DIAGNOSIS — F419 Anxiety disorder, unspecified: Secondary | ICD-10-CM | POA: Diagnosis not present

## 2023-10-26 DIAGNOSIS — J4540 Moderate persistent asthma, uncomplicated: Secondary | ICD-10-CM

## 2023-10-26 DIAGNOSIS — E0590 Thyrotoxicosis, unspecified without thyrotoxic crisis or storm: Secondary | ICD-10-CM

## 2023-10-26 DIAGNOSIS — Z1231 Encounter for screening mammogram for malignant neoplasm of breast: Secondary | ICD-10-CM | POA: Insufficient documentation

## 2023-10-26 DIAGNOSIS — Z0000 Encounter for general adult medical examination without abnormal findings: Secondary | ICD-10-CM

## 2023-10-26 DIAGNOSIS — E66811 Body mass index (BMI) 34.0-34.9, adult: Secondary | ICD-10-CM

## 2023-10-26 LAB — POCT GLYCOSYLATED HEMOGLOBIN (HGB A1C): Hemoglobin A1C: 6.8 % — AB (ref 4.0–5.6)

## 2023-10-26 MED ORDER — BUSPIRONE HCL 5 MG PO TABS
5.0000 mg | ORAL_TABLET | Freq: Three times a day (TID) | ORAL | 1 refills | Status: AC | PRN
  Filled 2023-10-26: qty 90, 30d supply, fill #0
  Filled 2024-02-09: qty 90, 30d supply, fill #1

## 2023-10-26 MED ORDER — BUDESONIDE-FORMOTEROL FUMARATE 80-4.5 MCG/ACT IN AERO
2.0000 | INHALATION_SPRAY | Freq: Two times a day (BID) | RESPIRATORY_TRACT | 3 refills | Status: AC
  Filled 2023-10-26: qty 10.2, 30d supply, fill #0

## 2023-10-26 MED ORDER — FLUTICASONE PROPIONATE 50 MCG/ACT NA SUSP
2.0000 | Freq: Every day | NASAL | 6 refills | Status: AC
  Filled 2023-10-26: qty 16, 30d supply, fill #0
  Filled 2024-02-09: qty 16, 30d supply, fill #1
  Filled 2024-08-07: qty 16, 30d supply, fill #2

## 2023-10-26 MED ORDER — ATORVASTATIN CALCIUM 40 MG PO TABS
40.0000 mg | ORAL_TABLET | Freq: Every day | ORAL | 3 refills | Status: DC
  Filled 2023-10-26: qty 90, 90d supply, fill #0
  Filled 2024-02-09: qty 90, 90d supply, fill #1
  Filled 2024-05-09: qty 90, 90d supply, fill #2
  Filled 2024-08-07: qty 90, 90d supply, fill #3

## 2023-10-26 MED ORDER — FENOFIBRATE 48 MG PO TABS
48.0000 mg | ORAL_TABLET | Freq: Every day | ORAL | 3 refills | Status: AC
  Filled 2023-10-26: qty 90, 90d supply, fill #0
  Filled 2024-02-09: qty 90, 90d supply, fill #1
  Filled 2024-05-09: qty 90, 90d supply, fill #2
  Filled 2024-08-07: qty 90, 90d supply, fill #3

## 2023-10-26 MED ORDER — CYCLOBENZAPRINE HCL 10 MG PO TABS
10.0000 mg | ORAL_TABLET | Freq: Three times a day (TID) | ORAL | 0 refills | Status: AC | PRN
  Filled 2023-10-26: qty 180, 60d supply, fill #0

## 2023-10-26 MED ORDER — LOSARTAN POTASSIUM 50 MG PO TABS
50.0000 mg | ORAL_TABLET | Freq: Every day | ORAL | 3 refills | Status: DC
  Filled 2023-10-26 – 2024-02-09 (×2): qty 90, 90d supply, fill #0
  Filled 2024-05-29: qty 90, 90d supply, fill #1
  Filled 2024-09-16: qty 90, 90d supply, fill #2

## 2023-10-26 MED ORDER — ALBUTEROL SULFATE HFA 108 (90 BASE) MCG/ACT IN AERS
2.0000 | INHALATION_SPRAY | Freq: Four times a day (QID) | RESPIRATORY_TRACT | 2 refills | Status: AC | PRN
  Filled 2023-10-26: qty 6.7, 25d supply, fill #0

## 2023-10-26 MED ORDER — FLUOXETINE HCL 40 MG PO CAPS
40.0000 mg | ORAL_CAPSULE | Freq: Every day | ORAL | 3 refills | Status: DC
  Filled 2023-10-26: qty 90, 90d supply, fill #0
  Filled 2024-02-09: qty 90, 90d supply, fill #1
  Filled 2024-05-29: qty 90, 90d supply, fill #2
  Filled 2024-09-16: qty 90, 90d supply, fill #3

## 2023-10-26 MED ORDER — METFORMIN HCL 1000 MG PO TABS
1000.0000 mg | ORAL_TABLET | Freq: Every day | ORAL | 3 refills | Status: DC
  Filled 2023-10-26: qty 90, 90d supply, fill #0
  Filled 2024-02-09: qty 90, 90d supply, fill #1
  Filled 2024-05-09: qty 90, 90d supply, fill #2
  Filled 2024-08-07: qty 90, 90d supply, fill #3

## 2023-10-26 NOTE — Progress Notes (Signed)
BP 122/72 (BP Location: Left Arm, Patient Position: Sitting, Cuff Size: Large)   Pulse 92   Temp 98.1 F (36.7 C) (Oral)   Resp 14   Ht 5\' 4"  (1.626 m) Comment: per patient  Wt 201 lb 8 oz (91.4 kg)   SpO2 99%   BMI 34.59 kg/m    Subjective:    Patient ID: Julie Marquez, female    DOB: 1971-05-07, 52 y.o.   MRN: [id number]  HPI: Julie Marquez is a 52 y.o. female  Chief Complaint  Patient presents with   Diabetes   Bloodwork   Discussed the use of AI scribe software for clinical note transcription with the patient, who gave verbal consent to proceed.  History of Present Illness   The patient, with a history of hypertension, asthma, hypothyroidism, diabetes, obesity, hyperlipidemia, and depression, presents with concerns about her thyroid levels and anxiety. She reports that her TSH has been low over the past year, indicating subclinical hyperthyroidism, despite a previous diagnosis of hypothyroidism for which she was taking levothyroxine. She is no longer on levothyroxine and has been recommended for reevaluation by endocrinology.  Her A1c has improved from 8.4 to 6.8, which she attributes to resuming her full dose of metformin and starting Rybelsus. However, she expresses concern about the cost of Rybelsus due to an upcoming change in her insurance.  The patient also reports daily use of her albuterol inhaler due to shortness of breath. She has not been on any other inhalers for her asthma. She also mentions having neck spasms for which she takes Flexeril.  Regarding her mental health, the patient reports good mood but struggles with anxiety, particularly in social situations and when talking to men. She is currently on fluoxetine for her depression.      10/26/2023    9:43 AM 06/30/2023    1:01 PM 05/16/2023    2:30 PM 01/10/2023    2:48 PM 11/23/2022    2:44 PM  Depression screen PHQ 2/9  Decreased Interest 2 0 0 0 0  Down, Depressed, Hopeless 2 0 0 0 0  PHQ - 2 Score  4 0 0 0 0  Altered sleeping 0 0 0 0 0  Tired, decreased energy 3 1 0 0 0  Change in appetite 0 0 0 0 0  Feeling bad or failure about yourself  0 0 0 0 0  Trouble concentrating 1 0 0 0 0  Moving slowly or fidgety/restless 1 0 0 0 0  Suicidal thoughts 0 0 0 0 0  PHQ-9 Score 9 1 0 0 0  Difficult doing work/chores Not difficult at all Not difficult at all  Not difficult at all Not difficult at all       10/26/2023    9:43 AM 06/30/2023    1:01 PM 07/07/2022   11:09 AM 04/19/2022   10:01 AM  GAD 7 : Generalized Anxiety Score  Nervous, Anxious, on Edge 1 0 0 0  Control/stop worrying 1 0 0 0  Worry too much - different things 1 0 0 0  Trouble relaxing 1 0 0 0  Restless 1 0 0 0  Easily annoyed or irritable 1 0 0 0  Afraid - awful might happen 0 0 0 0  Total GAD 7 Score 6 0 0 0  Anxiety Difficulty Not difficult at all Not difficult at all Not difficult at all Not difficult at all        10/26/2023    9:41  AM 06/30/2023    1:02 PM 05/16/2023    2:38 PM  Vitals with BMI  Height 5\' 4"  5\' 4"  5\' 4"   Weight 201 lbs 8 oz 201 lbs 11 oz 205 lbs  BMI 34.57 34.6 35.17  Systolic 122 128 098  Diastolic 72 82 76  Pulse 92 98 90   Lipid Panel     Component Value Date/Time   CHOL 165 06/30/2023 1335   CHOL 205 (H) 04/20/2016 1216   TRIG 613 (H) 06/30/2023 1335   HDL 47 (L) 06/30/2023 1335   HDL 47 04/20/2016 1216   CHOLHDL 3.5 06/30/2023 1335   VLDL 37 12/10/2019 1515   LDLCALC  06/30/2023 1335     Comment:     . LDL cholesterol not calculated. Triglyceride levels greater than 400 mg/dL invalidate calculated LDL results. . Reference range: <100 . Desirable range <100 mg/dL for primary prevention;   <70 mg/dL for patients with CHD or diabetic patients  with > or = 2 CHD risk factors. Marland Kitchen LDL-C is now calculated using the Martin-Hopkins  calculation, which is a validated novel method providing  better accuracy than the Friedewald equation in the  estimation of LDL-C.  Horald Pollen et al.  Lenox Ahr. 1191;478(29): 2061-2068  (http://education.[facility].com/faq/FAQ164)    LABVLDL 39 04/20/2016 1216    Relevant past medical, surgical, family and social history reviewed and updated as indicated. Interim medical history since our last visit reviewed. Allergies and medications reviewed and updated.  Review of Systems  Constitutional: Negative for fever or weight change.  Respiratory: Negative for cough and shortness of breath.   Cardiovascular: Negative for chest pain or palpitations.  Gastrointestinal: Negative for abdominal pain, no bowel changes.  Musculoskeletal: Negative for gait problem or joint swelling.  Skin: Negative for rash.  Neurological: Negative for dizziness or headache.  No other specific complaints in a complete review of systems (except as listed in HPI above).      Objective:    BP 122/72 (BP Location: Left Arm, Patient Position: Sitting, Cuff Size: Large)   Pulse 92   Temp 98.1 F (36.7 C) (Oral)   Resp 14   Ht 5\' 4"  (1.626 m) Comment: per patient  Wt 201 lb 8 oz (91.4 kg)   SpO2 99%   BMI 34.59 kg/m   Wt Readings from Last 3 Encounters:  10/26/23 201 lb 8 oz (91.4 kg)  06/30/23 201 lb 11.2 oz (91.5 kg)  05/16/23 205 lb (93 kg)    Physical Exam  Constitutional: Patient appears well-developed and well-nourished. Obese  No distress.  HEENT: head atraumatic, normocephalic, pupils equal and reactive to light, neck supple Cardiovascular: Normal rate, regular rhythm and normal heart sounds.  No murmur heard. No BLE edema. Pulmonary/Chest: Effort normal and breath sounds normal. No respiratory distress. Abdominal: Soft.  There is no tenderness. Psychiatric: Patient has a normal mood and affect. behavior is normal. Judgment and thought content normal.   Results for orders placed or performed in visit on 10/26/23  POCT HgB A1C  Result Value Ref Range   Hemoglobin A1C 6.8 (A) 4.0 - 5.6 %   HbA1c POC (<> result, manual entry)     HbA1c, POC  (prediabetic range)     HbA1c, POC (controlled diabetic range)        Assessment & Plan:   Problem List Items Addressed This Visit       Cardiovascular and Mediastinum   Essential hypertension, benign (Chronic)   Relevant Medications   losartan (COZAAR)  50 MG tablet   fenofibrate (TRICOR) 48 MG tablet   atorvastatin (LIPITOR) 40 MG tablet   Other Relevant Orders   CBC with Differential/Platelet   COMPLETE METABOLIC PANEL WITH GFR     Respiratory   Mild intermittent asthma   Relevant Medications   budesonide-formoterol (SYMBICORT) 80-4.5 MCG/ACT inhaler   albuterol (VENTOLIN HFA) 108 (90 Base) MCG/ACT inhaler     Endocrine   Diabetes mellitus with microalbuminuria (HCC)   Relevant Medications   metFORMIN (GLUCOPHAGE) 1000 MG tablet   losartan (COZAAR) 50 MG tablet   atorvastatin (LIPITOR) 40 MG tablet   Other Relevant Orders   POCT HgB A1C (Completed)   COMPLETE METABOLIC PANEL WITH GFR     Other   Chronic neck pain   Relevant Medications   FLUoxetine (PROZAC) 40 MG capsule   cyclobenzaprine (FLEXERIL) 10 MG tablet   Class 1 obesity with serious comorbidity and body mass index (BMI) of 34.0 to 34.9 in adult    Comorbidities include DM, HTN, HLD, depression, anxiety      Relevant Medications   metFORMIN (GLUCOPHAGE) 1000 MG tablet   Anxiety   Relevant Medications   busPIRone (BUSPAR) 5 MG tablet   FLUoxetine (PROZAC) 40 MG capsule   Mixed hyperlipidemia   Relevant Medications   losartan (COZAAR) 50 MG tablet   fenofibrate (TRICOR) 48 MG tablet   atorvastatin (LIPITOR) 40 MG tablet   Other Relevant Orders   COMPLETE METABOLIC PANEL WITH GFR   Lipid panel   Current mild episode of major depressive disorder, unspecified whether recurrent (HCC)   Relevant Medications   busPIRone (BUSPAR) 5 MG tablet   FLUoxetine (PROZAC) 40 MG capsule   Other Visit Diagnoses     Abnormal TSH    -  Primary   Relevant Orders   Thyroid Panel With TSH   Ambulatory referral to  Endocrinology   Subclinical hyperthyroidism       Relevant Orders   Ambulatory referral to Endocrinology   Rhinosinusitis       diffuse eryrthema and edema - tx with flonase and antihistamines   Relevant Medications   fluticasone (FLONASE) 50 MCG/ACT nasal spray   Muscle spasms of neck       Relevant Medications   cyclobenzaprine (FLEXERIL) 10 MG tablet   Moderate persistent asthma, unspecified whether complicated       Relevant Medications   budesonide-formoterol (SYMBICORT) 80-4.5 MCG/ACT inhaler   albuterol (VENTOLIN HFA) 108 (90 Base) MCG/ACT inhaler       Assessment and Plan    Diabetes Mellitus Improved control with A1c down to 6.8 from 8.4. Patient is on Metformin 1000mg  daily and Rybelsus 7mg  daily. Patient expressed concern about insurance coverage for Rybelsus. -Continue current regimen. -Check for available coupons for Rybelsus. -Refill Metformin prescription, change to 1000mg  tablets for convenience.  Subclinical Hyperthyroidism TSH has been low over the past year, while T3 and T4 are normal. Patient has a history of hypothyroidism and was previously on Levothyroxine. -Repeat TSH, T3, and T4 today. -Refer to endocrinology for further evaluation and management.  Hyperlipidemia Patient is on Atorvastatin 40mg  daily and Fenofibrate 48mg  daily. Last triglycerides were high at 613. -Continue current regimen. -Refill Atorvastatin and Fenofibrate prescriptions. -Recheck lipid panel today.  Asthma Daily use of Albuterol inhaler due to shortness of breath. -Consider adding a maintenance inhaler (e.g., Advair, Symbicort) to reduce the need for Albuterol. -Refill Albuterol prescription.  Anxiety Patient reports situational anxiety, particularly when talking to men. Currently on Fluoxetine 40mg   daily. -Add Buspar 5mg  up to three times a day as needed. -Continue Fluoxetine 40mg  daily. -Refill Fluoxetine prescription.  Hypertension Well-controlled, no reported issues.  Patient is on Losartan 50mg  daily. -Continue Losartan 50mg  daily. -Refill Losartan prescription.  General Health Maintenance -Refill Flonase and Flexeril prescriptions. -Physical exam to assess heart and lungs. -Draw labs today to monitor thyroid and cholesterol.        Follow up plan: Return in about 6 months (around 04/25/2024) for follow up.

## 2023-10-26 NOTE — Patient Instructions (Signed)
https://[email or web address]-[email or web address]  Or you can text READY to [phone] for Rybelsus savings program

## 2023-10-26 NOTE — Assessment & Plan Note (Signed)
Comorbidities include DM, HTN, HLD, depression, anxiety

## 2023-10-27 ENCOUNTER — Other Ambulatory Visit: Payer: Self-pay | Admitting: Nurse Practitioner

## 2023-10-27 LAB — COMPLETE METABOLIC PANEL WITH GFR
AG Ratio: 1.8 (calc) (ref 1.0–2.5)
ALT: 21 U/L (ref 6–29)
AST: 16 U/L (ref 10–35)
Albumin: 4.4 g/dL (ref 3.6–5.1)
Alkaline phosphatase (APISO): 123 U/L (ref 37–153)
BUN: 12 mg/dL (ref 7–25)
CO2: 27 mmol/L (ref 20–32)
Calcium: 9.5 mg/dL (ref 8.6–10.4)
Chloride: 102 mmol/L (ref 98–110)
Creat: 0.62 mg/dL (ref 0.50–1.03)
Globulin: 2.5 g/dL (ref 1.9–3.7)
Glucose, Bld: 129 mg/dL — ABNORMAL HIGH (ref 65–99)
Potassium: 4.6 mmol/L (ref 3.5–5.3)
Sodium: 138 mmol/L (ref 135–146)
Total Bilirubin: 0.4 mg/dL (ref 0.2–1.2)
Total Protein: 6.9 g/dL (ref 6.1–8.1)
eGFR: 107 mL/min/{1.73_m2} (ref >=60)

## 2023-10-27 LAB — CBC WITH DIFFERENTIAL/PLATELET
Absolute Lymphocytes: 2218 {cells}/uL (ref 850–3900)
Absolute Monocytes: 653 {cells}/uL (ref 200–950)
Basophils Absolute: 59 {cells}/uL (ref 0–200)
Basophils Relative: 0.6 %
Eosinophils Absolute: 149 {cells}/uL (ref 15–500)
Eosinophils Relative: 1.5 %
HCT: 38.6 % (ref 35.0–45.0)
Hemoglobin: 12.6 g/dL (ref 11.7–15.5)
MCH: 26.2 pg — ABNORMAL LOW (ref 27.0–33.0)
MCHC: 32.6 g/dL (ref 32.0–36.0)
MCV: 80.2 fL (ref 80.0–100.0)
MPV: 11.6 fL (ref 7.5–12.5)
Monocytes Relative: 6.6 %
Neutro Abs: 6821 {cells}/uL (ref 1500–7800)
Neutrophils Relative %: 68.9 %
Platelets: 346 10*3/uL (ref 140–400)
RBC: 4.81 10*6/uL (ref 3.80–5.10)
RDW: 14.4 % (ref 11.0–15.0)
Total Lymphocyte: 22.4 %
WBC: 9.9 10*3/uL (ref 3.8–10.8)

## 2023-10-27 LAB — LIPID PANEL
Cholesterol: 161 mg/dL (ref <200)
HDL: 58 mg/dL (ref >=50)
LDL Cholesterol (Calc): 72 mg/dL
Non-HDL Cholesterol (Calc): 103 mg/dL (ref <130)
Total CHOL/HDL Ratio: 2.8 (calc) (ref <5.0)
Triglycerides: 214 mg/dL — ABNORMAL HIGH (ref <150)

## 2023-10-27 LAB — THYROID PANEL WITH TSH
Free Thyroxine Index: 2 (ref 1.4–3.8)
T3 Uptake: 23 % (ref 22–35)
T4, Total: 8.5 ug/dL (ref 5.1–11.9)
TSH: 0.2 m[IU]/L — ABNORMAL LOW

## 2023-10-31 LAB — GENECONNECT MOLECULAR SCREEN: Genetic Analysis Overall Interpretation: NEGATIVE

## 2023-11-03 ENCOUNTER — Encounter: Payer: Self-pay | Admitting: Nurse Practitioner

## 2023-11-03 ENCOUNTER — Other Ambulatory Visit: Payer: Self-pay

## 2023-11-03 MED FILL — Fluconazole Tab 150 MG: ORAL | 18 days supply | Qty: 9 | Fill #0 | Status: AC

## 2023-11-09 ENCOUNTER — Other Ambulatory Visit: Payer: Self-pay

## 2023-11-09 DIAGNOSIS — E890 Postprocedural hypothyroidism: Secondary | ICD-10-CM | POA: Diagnosis not present

## 2023-11-09 DIAGNOSIS — R7989 Other specified abnormal findings of blood chemistry: Secondary | ICD-10-CM | POA: Diagnosis not present

## 2023-11-09 MED ORDER — METHIMAZOLE 5 MG PO TABS
2.5000 mg | ORAL_TABLET | Freq: Every day | ORAL | 3 refills | Status: DC
  Filled 2023-11-09: qty 45, 90d supply, fill #0
  Filled 2024-02-09: qty 45, 90d supply, fill #1
  Filled 2024-05-09: qty 45, 90d supply, fill #2
  Filled 2024-08-07: qty 45, 90d supply, fill #3

## 2023-11-10 ENCOUNTER — Other Ambulatory Visit: Payer: Self-pay

## 2024-01-04 DIAGNOSIS — E890 Postprocedural hypothyroidism: Secondary | ICD-10-CM | POA: Diagnosis not present

## 2024-01-04 DIAGNOSIS — R7989 Other specified abnormal findings of blood chemistry: Secondary | ICD-10-CM | POA: Diagnosis not present

## 2024-01-04 DIAGNOSIS — R946 Abnormal results of thyroid function studies: Secondary | ICD-10-CM | POA: Diagnosis not present

## 2024-01-12 ENCOUNTER — Other Ambulatory Visit: Payer: Self-pay

## 2024-01-12 DIAGNOSIS — E890 Postprocedural hypothyroidism: Secondary | ICD-10-CM | POA: Diagnosis not present

## 2024-01-12 DIAGNOSIS — R7989 Other specified abnormal findings of blood chemistry: Secondary | ICD-10-CM | POA: Diagnosis not present

## 2024-02-01 ENCOUNTER — Other Ambulatory Visit (HOSPITAL_COMMUNITY): Payer: Self-pay

## 2024-02-09 ENCOUNTER — Other Ambulatory Visit: Payer: Self-pay

## 2024-04-16 ENCOUNTER — Other Ambulatory Visit: Payer: Self-pay

## 2024-05-09 ENCOUNTER — Other Ambulatory Visit: Payer: Self-pay

## 2024-05-29 ENCOUNTER — Other Ambulatory Visit: Payer: Self-pay

## 2024-06-05 ENCOUNTER — Other Ambulatory Visit: Payer: Self-pay

## 2024-07-17 ENCOUNTER — Ambulatory Visit (INDEPENDENT_AMBULATORY_CARE_PROVIDER_SITE_OTHER): Admitting: Family Medicine

## 2024-07-17 DIAGNOSIS — Z91199 Patient's noncompliance with other medical treatment and regimen due to unspecified reason: Secondary | ICD-10-CM

## 2024-07-17 DIAGNOSIS — E1129 Type 2 diabetes mellitus with other diabetic kidney complication: Secondary | ICD-10-CM

## 2024-07-17 DIAGNOSIS — Z0000 Encounter for general adult medical examination without abnormal findings: Secondary | ICD-10-CM

## 2024-07-17 DIAGNOSIS — R7989 Other specified abnormal findings of blood chemistry: Secondary | ICD-10-CM

## 2024-07-17 NOTE — Progress Notes (Unsigned)
 Patient: Julie Marquez, Female    DOB: 1971/03/19, 53 y.o.   MRN: [id number] Leavy Mole, PA-C Visit Date: 07/17/2024  Today's Provider: Mole Leavy, PA-C   No chief complaint on file.  Subjective:   Annual physical exam:  Julie Marquez is a 53 y.o. female who presents today for complete physical exam:  Exercise/Activity: Diet/nutrition: Sleep:   SDOH Screenings   Food Insecurity: No Food Insecurity (06/30/2023)  Housing: Unknown (01/04/2024)   Received from [facility]  Transportation Needs: No Transportation Needs (06/30/2023)  Utilities: Not At Risk (06/30/2023)  Alcohol Screen: Low Risk  (06/30/2023)  Depression (PHQ2-9): Medium Risk (10/26/2023)  Financial Resource Strain: Low Risk  (06/30/2023)  Physical Activity: Sufficiently Active (06/30/2023)  Social Connections: Moderately Isolated (06/30/2023)  Stress: No Stress Concern Present (06/30/2023)  Tobacco Use: Medium Risk (02/09/2024)   Received from [facility]  Health Literacy: Adequate Health Literacy (06/30/2023)     Pt wished  *** discuss acute complaints  *** do routine f/up on chronic conditions today in addition to CPE. Advised pt of separate visit billing/coding  USPSTF grade A and B recommendations - reviewed and addressed today  Depression:  Phq 9 completed today by patient, was reviewed by me with patient in the room PHQ score is ***, pt feels ***    10/26/2023    9:43 AM 06/30/2023    1:01 PM 05/16/2023    2:30 PM 01/10/2023    2:48 PM  PHQ 2/9 Scores  PHQ - 2 Score 4 0 0 0  PHQ- 9 Score 9 1 0 0      10/26/2023    9:43 AM 06/30/2023    1:01 PM 05/16/2023    2:30 PM 01/10/2023    2:48 PM 11/23/2022    2:44 PM  Depression screen PHQ 2/9  Decreased Interest 2 0 0 0 0  Down, Depressed, Hopeless 2 0 0 0 0  PHQ - 2 Score 4 0 0 0 0  Altered sleeping 0 0 0 0 0  Tired, decreased energy 3 1 0 0 0  Change in appetite 0 0 0 0 0  Feeling bad or failure about yourself  0 0 0 0 0   Trouble concentrating 1 0 0 0 0  Moving slowly or fidgety/restless 1 0 0 0 0  Suicidal thoughts 0 0 0 0 0  PHQ-9 Score 9 1 0 0 0  Difficult doing work/chores Not difficult at all Not difficult at all  Not difficult at all Not difficult at all    Alcohol screening: Flowsheet Row Office Visit from 06/30/2023 in [facility]  AUDIT-C Score 0    Immunizations and Health Maintenance: Health Maintenance  Topic Date Due   OPHTHALMOLOGY EXAM  12/22/2022   HEMOGLOBIN A1C  04/25/2024   Diabetic kidney evaluation - Urine ACR  06/29/2024   FOOT EXAM  06/29/2024   COVID-19 Vaccine (3 - 2024-25 season) 08/01/2024 (Originally 07/23/2023)   Zoster Vaccines- Shingrix (1 of 2) 10/16/2024 (Originally 01/31/2021)   INFLUENZA VACCINE  02/18/2025 (Originally 06/21/2024)   Hepatitis B Vaccines 19-59 Average Risk (1 of 3 - 19+ 3-dose series) 07/16/2025 (Originally 01/31/1990)   Diabetic kidney evaluation - eGFR measurement  10/25/2024   MAMMOGRAM  10/25/2024   Colonoscopy  09/12/2025   Cervical Cancer Screening (HPV/Pap Cotest)  07/08/2027   DTaP/Tdap/Td (2 - Td or Tdap) 07/10/2031   Pneumococcal Vaccine: 50+ Years  Completed   Hepatitis C Screening  Completed   HIV Screening  Completed   HPV VACCINES  Aged Out   Meningococcal B Vaccine  Aged Out   Fecal DNA (Cologuard)  Discontinued     Hep C Screening: ***  STD testing and prevention (HIV/chl/gon/syphilis):  see above, no additional testing desired by pt today  Intimate partner violence:***  Sexual History/Pain during Intercourse: Married  Menstrual History/LMP/Abnormal Bleeding: *** No LMP recorded. (Menstrual status: IUD).  Incontinence Symptoms: ***  Breast cancer:  Last Mammogram: *see HM list above BRCA gene screening: ***  Cervical cancer screening: *** Pt *** family hx of cancers - breast, ovarian, uterine, colon:     Osteoporosis:   Discussion on osteoporosis per age, including high calcium  and vitamin D   supplementation, weight bearing exercises Pt is *** supplementing with daily calcium /Vit D. *** Bone scan/dexa Roughly experienced menopause at age ***  Skin cancer:  Hx of skin CA -  NO*** Discussed atypical lesions   Colorectal cancer:   Colonoscopy is ***   Discussed concerning signs and sx of CRC, pt denies ***  Lung cancer:   Low Dose CT Chest recommended if Age 53-80 years, 20 pack-year currently smoking OR have quit w/in 15years. Patient {DOES NOT does:27190::does not} qualify.    Social History   Tobacco Use   Smoking status: Former    Current packs/day: 0.00    Average packs/day: 0.3 packs/day for 20.0 years (5.0 ttl pk-yrs)    Types: Cigarettes    Start date: 12/2001    Quit date: 12/2021    Years since quitting: 2.5   Smokeless tobacco: Never  Vaping Use   Vaping status: Every Day   Substances: Nicotine, Flavoring  Substance Use Topics   Alcohol use: Not Currently    Comment: occas   Drug use: No     Flowsheet Row Office Visit from 06/30/2023 in [facility]  AUDIT-C Score 0    Family History  Problem Relation Age of Onset   Heart disease Father    Alcohol abuse Father    Cancer Maternal Grandmother        breast   Cancer Paternal Grandmother        breast   Breast cancer Paternal Grandmother    Dementia Mother    Thyroid  disease Sister    Alcohol abuse Brother    Alcohol abuse Brother      Blood pressure/Hypertension: BP Readings from Last 3 Encounters:  10/26/23 122/72  06/30/23 128/82  05/16/23 128/76    Weight/Obesity: Wt Readings from Last 3 Encounters:  10/26/23 201 lb 8 oz (91.4 kg)  06/30/23 201 lb 11.2 oz (91.5 kg)  05/16/23 205 lb (93 kg)   BMI Readings from Last 3 Encounters:  10/26/23 34.59 kg/m  06/30/23 34.62 kg/m  05/16/23 35.19 kg/m     Lipids:  Lab Results  Component Value Date   CHOL 161 10/26/2023   CHOL 165 06/30/2023   CHOL 153 02/14/2022   Lab Results  Component Value Date    HDL 58 10/26/2023   HDL 47 (L) 06/30/2023   HDL 52 02/14/2022   Lab Results  Component Value Date   LDLCALC 72 10/26/2023   LDLCALC  06/30/2023     Comment:     . LDL cholesterol not calculated. Triglyceride levels greater than 400 mg/dL invalidate calculated LDL results. . Reference range: <100 . Desirable range <100 mg/dL for primary prevention;   <70 mg/dL for patients with CHD or diabetic patients  with >  or = 2 CHD risk factors. SABRA LDL-C is now calculated using the Martin-Hopkins  calculation, which is a validated novel method providing  better accuracy than the Friedewald equation in the  estimation of LDL-C.  Gladis APPLETHWAITE et al. SANDREA. 7986;689(80): 2061-2068  (http://education.[facility].com/faq/FAQ164)    LDLCALC 66 02/14/2022   Lab Results  Component Value Date   TRIG 214 (H) 10/26/2023   TRIG 613 (H) 06/30/2023   TRIG 293 (H) 02/14/2022   Lab Results  Component Value Date   CHOLHDL 2.8 10/26/2023   CHOLHDL 3.5 06/30/2023   CHOLHDL 2.9 02/14/2022   No results found for: LDLDIRECT Based on the results of lipid panel his/her cardiovascular risk factor ( using [facility] )  in the next 10 years is: The 10-year ASCVD risk score (Arnett DK, et al., 2019) is: 3.7%   Values used to calculate the score:     Age: 19 years     Clincally relevant sex: Female     Is Non-Hispanic African American: No     Diabetic: Yes     Tobacco smoker: No     Systolic Blood Pressure: 139 mmHg     Is BP treated: Yes     HDL Cholesterol: 58 mg/dL     Total Cholesterol: 161 mg/dL  Glucose:  Glucose, Bld  Date Value Ref Range Status  10/26/2023 129 (H) 65 - 99 mg/dL Final    Comment:    .            Fasting reference interval . For someone without known diabetes, a glucose value >125 mg/dL indicates that they may have diabetes and this should be confirmed with a follow-up test. .   06/30/2023 174 (H) 65 - 99 mg/dL Final    Comment:    .            Fasting  reference interval . For someone without known diabetes, a glucose value >125 mg/dL indicates that they may have diabetes and this should be confirmed with a follow-up test. .   07/07/2022 79 65 - 99 mg/dL Final    Comment:    .            Fasting reference interval .    Glucose-Capillary  Date Value Ref Range Status  09/12/2022 145 (H) 70 - 99 mg/dL Final    Comment:    Glucose reference range applies only to samples taken after fasting for at least 8 hours.  09/08/2021 92 70 - 99 mg/dL Final    Comment:    Glucose reference range applies only to samples taken after fasting for at least 8 hours.    Advanced Care Planning:  A voluntary discussion about advance care planning including the explanation and discussion of advance directives.   Discussed health care proxy and Living will, and the patient was able to identify a health care proxy as ***.   Patient {DOES_DOES WNU:81435} have a living will at present time.   Social History       Social History   Socioeconomic History   Marital status: Married    Spouse name: Julie Marquez   Number of children: 1   Years of education: Not on file   Highest education level: Associate degree: occupational, Scientist, product/process development, or vocational program  Occupational History   Not on file  Tobacco Use   Smoking status: Former    Current packs/day: 0.00    Average packs/day: 0.3 packs/day for 20.0 years (5.0 ttl pk-yrs)  Types: Cigarettes    Start date: 12/2001    Quit date: 12/2021    Years since quitting: 2.5   Smokeless tobacco: Never  Vaping Use   Vaping status: Every Day   Substances: Nicotine, Flavoring  Substance and Sexual Activity   Alcohol use: Not Currently    Comment: occas   Drug use: No   Sexual activity: Yes    Birth control/protection: I.U.D.    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   Social Drivers of [employer]:  Low Risk  (06/30/2023)   Overall Financial Resource Strain (CARDIA)    Difficulty of Paying Living Expenses: Not hard at all  Food Insecurity: No Food Insecurity (06/30/2023)   Hunger Vital Sign    Worried About Running Out of Food in the Last Year: Never true    Ran Out of Food in the Last Year: Never true  Transportation Needs: No Transportation Needs (06/30/2023)   PRAPARE - Administrator, Civil Service (Medical): No    Lack of Transportation (Non-Medical): No  Physical Activity: Sufficiently Active (06/30/2023)   Exercise Vital Sign    Days of Exercise per Week: 5 days    Minutes of Exercise per Session: 30 min  Stress: No Stress Concern Present (06/30/2023)   Julie Marquez    Feeling of Stress : Only a little  Social Connections: Moderately Isolated (06/30/2023)   Social Connection and Isolation Panel    Frequency of Communication with Friends and Family: More than three times a week    Frequency of Social Gatherings with Friends and Family: More than three times a week    Attends Religious Services: Never    Database administrator or Organizations: No    Attends Engineer, structural: Never    Marital Status: Married    Family History        Family History  Problem Relation Age of Onset   Heart disease Father    Alcohol abuse Father    Cancer Maternal Grandmother        breast   Cancer Paternal Grandmother        breast   Breast cancer Paternal Grandmother    Dementia Mother    Thyroid  disease Sister    Alcohol abuse Brother    Alcohol abuse Brother     Patient Active Problem List   Diagnosis Date Noted   Sialadenitis 05/16/2023   History of colonic polyps    Current mild episode of major depressive disorder, unspecified whether recurrent (HCC) 07/07/2022   Polyp of colon    Excessive daytime sleepiness 07/09/2021   Mild intermittent asthma 07/14/2020   Mixed hyperlipidemia 04/18/2019    Compulsive gambling 12/03/2018   Diabetes mellitus with microalbuminuria (HCC) 08/09/2018   Class 1 obesity with serious comorbidity and body mass index (BMI) of 34.0 to 34.9 in adult 08/09/2018   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   Essential hypertension, benign 05/09/2018   Anxiety 01/11/2017   Vitamin D  deficiency 01/11/2017   Fatty liver 08/10/2016   Hypothyroid 04/22/2016   Chronic neck pain 02/17/2016    Past Surgical History:  Procedure Laterality Date   COLONOSCOPY WITH PROPOFOL  N/A 09/08/2021   Procedure: COLONOSCOPY WITH PROPOFOL ;  Surgeon: Janalyn Keene NOVAK, MD;  Location: [facility] ENDOSCOPY;  Service: Endoscopy;  Laterality: N/A;   COLONOSCOPY  WITH PROPOFOL  N/A 09/12/2022   Procedure: COLONOSCOPY WITH PROPOFOL ;  Surgeon: Unk Corinn Skiff, MD;  Location: [facility] ENDOSCOPY;  Service: Gastroenterology;  Laterality: N/A;   THYROIDECTOMY  2005     Current Outpatient Medications:    albuterol  (VENTOLIN  HFA) 108 (90 Base) MCG/ACT inhaler, Inhale 2 puffs into the lungs every 6 (six) hours as needed for wheezing or shortness of breath., Disp: 6.7 g, Rfl: 2   atorvastatin  (LIPITOR) 40 MG tablet, Take 1 tablet (40 mg total) by mouth at bedtime., Disp: 90 tablet, Rfl: 3   budesonide -formoterol  (SYMBICORT ) 80-4.5 MCG/ACT inhaler, Inhale 2 puffs into the lungs 2 (two) times daily., Disp: 10.2 g, Rfl: 3   busPIRone  (BUSPAR ) 5 MG tablet, Take 1 tablet (5 mg total) by mouth 3 (three) times daily as needed (anxiety)., Disp: 90 tablet, Rfl: 1   cyclobenzaprine  (FLEXERIL ) 10 MG tablet, Take 1 tablet (10 mg total) by mouth every 8 (eight) hours as needed for muscle spasms., Disp: 180 tablet, Rfl: 0   fenofibrate  (TRICOR ) 48 MG tablet, Take 1 tablet (48 mg total) by mouth daily., Disp: 90 tablet, Rfl: 3   fluconazole  (DIFLUCAN ) 150 MG tablet, Take 1 tablet (150 mg total) by mouth every other day. as needed for yeast, Disp: 9 tablet, Rfl: 0   FLUoxetine  (PROZAC ) 40 MG capsule, Take 1 capsule (40 mg  total) by mouth daily., Disp: 90 capsule, Rfl: 3   fluticasone  (FLONASE ) 50 MCG/ACT nasal spray, Place 2 sprays into both nostrils daily., Disp: 16 g, Rfl: 6   losartan  (COZAAR ) 50 MG tablet, Take 1 tablet (50 mg total) by mouth daily., Disp: 90 tablet, Rfl: 3   metFORMIN  (GLUCOPHAGE ) 1000 MG tablet, Take 1 tablet (1,000 mg total) by mouth daily with breakfast., Disp: 90 tablet, Rfl: 3   methimazole  (TAPAZOLE ) 5 MG tablet, Take 0.5 tablets (2.5 mg total) by mouth daily., Disp: 45 tablet, Rfl: 3   Semaglutide  (RYBELSUS ) 7 MG TABS, Take 1 tablet (7 mg total) by mouth daily., Disp: 90 tablet, Rfl: 0  Allergies  Allergen Reactions   Lisinopril     cough    Patient Care Team: Mayci Haning, PA-C as PCP - General (Family Medicine) Eappen, Saramma, MD as Consulting Physician (Psychiatry)   Chart Review: ***  Review of Systems        Objective:   Vitals:  There were no vitals filed for this visit.  There is no height or weight on file to calculate BMI.  Physical Exam    Fall Risk:    10/26/2023    9:33 AM 06/30/2023    1:00 PM 05/16/2023    2:30 PM 01/10/2023    2:48 PM 11/23/2022    2:44 PM  Fall Risk   Falls in the past year? 0 0 0 0 0  Number falls in past yr:  0 0 0 0  Injury with Fall?  0 0 0 0  Risk for fall due to : No Fall Risks No Fall Risks No Fall Risks No Fall Risks   Follow up Falls prevention discussed  Falls prevention discussed Falls prevention discussed;Education provided;Falls evaluation completed     Functional Status Survey:     Assessment & Plan:    CPE completed today  USPSTF grade A and B recommendations reviewed with patient; age-appropriate recommendations, preventive care, screening tests, etc discussed and encouraged; healthy living encouraged; see AVS for patient education given to patient  Discussed importance of 150 minutes of physical activity weekly, AHA exercise recommendations  given to pt in AVS/handout  Discussed importance of healthy  diet:  eating lean meats and proteins, avoiding trans fats and saturated fats, avoid simple sugars and excessive carbs in diet, eat 6 servings of fruit/vegetables daily and drink plenty of water and avoid sweet beverages.    Recommended pt to do annual eye exam and routine dental exams/cleanings  Depression, alcohol, fall screening completed as documented above and per flowsheets  Advance Care planning information and packet discussed and offered today, encouraged pt to discuss with family members/spouse/partner/friends and complete Advanced directive packet and bring copy to office   Reviewed Health Maintenance: Health Maintenance  Topic Date Due   OPHTHALMOLOGY EXAM  12/22/2022   HEMOGLOBIN A1C  04/25/2024   Diabetic kidney evaluation - Urine ACR  06/29/2024   FOOT EXAM  06/29/2024   COVID-19 Vaccine (3 - 2024-25 season) 08/01/2024 (Originally 07/23/2023)   Zoster Vaccines- Shingrix (1 of 2) 10/16/2024 (Originally 01/31/2021)   INFLUENZA VACCINE  02/18/2025 (Originally 06/21/2024)   Hepatitis B Vaccines 19-59 Average Risk (1 of 3 - 19+ 3-dose series) 07/16/2025 (Originally 01/31/1990)   Diabetic kidney evaluation - eGFR measurement  10/25/2024   MAMMOGRAM  10/25/2024   Colonoscopy  09/12/2025   Cervical Cancer Screening (HPV/Pap Cotest)  07/08/2027   DTaP/Tdap/Td (2 - Td or Tdap) 07/10/2031   Pneumococcal Vaccine: 50+ Years  Completed   Hepatitis C Screening  Completed   HIV Screening  Completed   HPV VACCINES  Aged Out   Meningococcal B Vaccine  Aged Out   Fecal DNA (Cologuard)  Discontinued    Immunizations: Immunization History  Administered Date(s) Administered   Influenza,inj,Quad PF,6+ Mos 08/09/2018, 08/22/2019   Influenza-Unspecified 08/21/2022   PFIZER(Purple Top)SARS-COV-2 Vaccination 08/14/2020, 09/11/2020   PNEUMOCOCCAL CONJUGATE-20 07/09/2021   Pneumococcal Polysaccharide-23 03/27/2017   Tdap 07/09/2021   Vaccines:  HPV: up to at age 63 , ask insurance if age  between 58-45  Shingrix: 25-64 yo and ask insurance if covered when patient above 22 yo Pneumonia: *** educated and discussed with patient. Flu: *** educated and discussed with patient. COVID:      ICD-10-CM   1. Well adult exam  Z00.00     2. Abnormal TSH  R79.89     3. Diabetes mellitus with microalbuminuria (HCC)  E11.29    R80.9           Michelene Cower, PA-C 07/17/24 12:51 PM  [facility]

## 2024-07-18 ENCOUNTER — Encounter: Payer: Self-pay | Admitting: Family Medicine

## 2024-07-30 ENCOUNTER — Encounter: Admitting: Family Medicine

## 2024-08-07 ENCOUNTER — Other Ambulatory Visit: Payer: Self-pay | Admitting: Nurse Practitioner

## 2024-08-07 ENCOUNTER — Other Ambulatory Visit: Payer: Self-pay

## 2024-08-07 DIAGNOSIS — M62838 Other muscle spasm: Secondary | ICD-10-CM

## 2024-08-08 ENCOUNTER — Other Ambulatory Visit: Payer: Self-pay | Admitting: Nurse Practitioner

## 2024-08-08 ENCOUNTER — Other Ambulatory Visit: Payer: Self-pay

## 2024-08-08 DIAGNOSIS — M62838 Other muscle spasm: Secondary | ICD-10-CM

## 2024-08-09 ENCOUNTER — Other Ambulatory Visit: Payer: Self-pay

## 2024-08-09 NOTE — Telephone Encounter (Signed)
 Requested medication (s) are due for refill today: yes  Requested medication (s) are on the active medication list: yes  Last refill:  10/26/23  Future visit scheduled: no  Notes to clinic:  Unable to refill per protocol, cannot delegate.      Requested Prescriptions  Pending Prescriptions Disp Refills   cyclobenzaprine  (FLEXERIL ) 10 MG tablet 180 tablet 0    Sig: Take 1 tablet (10 mg total) by mouth every 8 (eight) hours as needed for muscle spasms.     Not Delegated - Analgesics:  Muscle Relaxants Failed - 08/09/2024 12:28 PM      Failed - This refill cannot be delegated      Failed - Valid encounter within last 6 months    Recent Outpatient Visits           3 weeks ago No-show for appointment   [facility] Leavy Mole, PA-C

## 2024-08-12 ENCOUNTER — Other Ambulatory Visit: Payer: Self-pay

## 2024-09-16 ENCOUNTER — Other Ambulatory Visit: Payer: Self-pay

## 2024-09-16 ENCOUNTER — Other Ambulatory Visit: Payer: Self-pay | Admitting: Nurse Practitioner

## 2024-09-16 DIAGNOSIS — R809 Proteinuria, unspecified: Secondary | ICD-10-CM

## 2024-09-16 DIAGNOSIS — M62838 Other muscle spasm: Secondary | ICD-10-CM

## 2024-09-16 DIAGNOSIS — E782 Mixed hyperlipidemia: Secondary | ICD-10-CM

## 2024-09-17 ENCOUNTER — Other Ambulatory Visit: Payer: Self-pay

## 2024-09-17 ENCOUNTER — Other Ambulatory Visit: Payer: Self-pay | Admitting: Nurse Practitioner

## 2024-09-17 DIAGNOSIS — E1129 Type 2 diabetes mellitus with other diabetic kidney complication: Secondary | ICD-10-CM

## 2024-09-17 DIAGNOSIS — M62838 Other muscle spasm: Secondary | ICD-10-CM

## 2024-09-17 DIAGNOSIS — E782 Mixed hyperlipidemia: Secondary | ICD-10-CM

## 2024-09-19 ENCOUNTER — Other Ambulatory Visit: Payer: Self-pay

## 2024-09-19 MED FILL — Atorvastatin Calcium Tab 40 MG (Base Equivalent): ORAL | 90 days supply | Qty: 90 | Fill #0 | Status: CN

## 2024-09-19 NOTE — Telephone Encounter (Signed)
 Called patient to review medication requests and schedule future appt for physical. No answer. Unable to leave message, VM box full.

## 2024-09-19 NOTE — Telephone Encounter (Signed)
 Requested by interface surescripts. Last OV 10/26/23. No future visit.   Requested Prescriptions  Pending Prescriptions Disp Refills   atorvastatin  (LIPITOR) 40 MG tablet 90 tablet 0    Sig: Take 1 tablet (40 mg total) by mouth at bedtime.     Cardiovascular:  Antilipid - Statins Failed - 09/19/2024  1:19 PM      Failed - Valid encounter within last 12 months    Recent Outpatient Visits           2 months ago No-show for appointment   [facility] Tapia, Leisa, PA-C              Failed - Lipid Panel in normal range within the last 12 months    Cholesterol, Total  Date Value Ref Range Status  04/20/2016 205 (H) 100 - 199 mg/dL Final   Cholesterol  Date Value Ref Range Status  10/26/2023 161 <200 mg/dL Final   LDL Cholesterol (Calc)  Date Value Ref Range Status  10/26/2023 72 mg/dL (calc) Final    Comment:    Reference range: <100 . Desirable range <100 mg/dL for primary prevention;   <70 mg/dL for patients with CHD or diabetic patients  with > or = 2 CHD risk factors. SABRA LDL-C is now calculated using the Martin-Hopkins  calculation, which is a validated novel method providing  better accuracy than the Friedewald equation in the  estimation of LDL-C.  Gladis APPLETHWAITE et al. SANDREA. 7986;689(80): 2061-2068  (http://education.[facility].com/faq/FAQ164)    HDL  Date Value Ref Range Status  10/26/2023 58 > OR = 50 mg/dL Final  94/68/7982 47 >60 mg/dL Final   Triglycerides  Date Value Ref Range Status  10/26/2023 214 (H) <150 mg/dL Final    Comment:    . If a non-fasting specimen was collected, consider repeat triglyceride testing on a fasting specimen if clinically indicated.  Veatrice et al. J. of Clin. Lipidol. 2015;9:129-169. SABRA          Passed - Patient is not pregnant       cyclobenzaprine  (FLEXERIL ) 10 MG tablet 180 tablet 0    Sig: Take 1 tablet (10 mg total) by mouth every 8 (eight) hours as needed for muscle spasms.      Not Delegated - Analgesics:  Muscle Relaxants Failed - 09/19/2024  1:19 PM      Failed - This refill cannot be delegated      Failed - Valid encounter within last 6 months    Recent Outpatient Visits           2 months ago No-show for appointment   [facility] Leavy Mole, PA-C

## 2024-09-19 NOTE — Telephone Encounter (Signed)
 Requested medication (s) are due for refill today: na   Requested medication (s) are on the active medication list: yes   Last refill:  10/26/23 #180 0 refills  Future visit scheduled: no   Notes to clinic:  not delegated per protocol. Called patient to schedule appt. No answer, VM box full unable to leave message. Do you want to refill Rx?     Requested Prescriptions  Pending Prescriptions Disp Refills   cyclobenzaprine  (FLEXERIL ) 10 MG tablet 180 tablet 0    Sig: Take 1 tablet (10 mg total) by mouth every 8 (eight) hours as needed for muscle spasms.     Not Delegated - Analgesics:  Muscle Relaxants Failed - 09/19/2024  1:20 PM      Failed - This refill cannot be delegated      Failed - Valid encounter within last 6 months    Recent Outpatient Visits           2 months ago No-show for appointment   [facility] Leavy Mole, PA-C              Signed Prescriptions Disp Refills   atorvastatin  (LIPITOR) 40 MG tablet 90 tablet 0    Sig: Take 1 tablet (40 mg total) by mouth at bedtime.     Cardiovascular:  Antilipid - Statins Failed - 09/19/2024  1:20 PM      Failed - Valid encounter within last 12 months    Recent Outpatient Visits           2 months ago No-show for appointment   [facility] Tapia, Leisa, PA-C              Failed - Lipid Panel in normal range within the last 12 months    Cholesterol, Total  Date Value Ref Range Status  04/20/2016 205 (H) 100 - 199 mg/dL Final   Cholesterol  Date Value Ref Range Status  10/26/2023 161 <200 mg/dL Final   LDL Cholesterol (Calc)  Date Value Ref Range Status  10/26/2023 72 mg/dL (calc) Final    Comment:    Reference range: <100 . Desirable range <100 mg/dL for primary prevention;   <70 mg/dL for patients with CHD or diabetic patients  with > or = 2 CHD risk factors. SABRA LDL-C is now calculated using the Martin-Hopkins  calculation, which is a validated  novel method providing  better accuracy than the Friedewald equation in the  estimation of LDL-C.  Gladis APPLETHWAITE et al. SANDREA. 7986;689(80): 2061-2068  (http://education.[facility].com/faq/FAQ164)    HDL  Date Value Ref Range Status  10/26/2023 58 > OR = 50 mg/dL Final  94/68/7982 47 >60 mg/dL Final   Triglycerides  Date Value Ref Range Status  10/26/2023 214 (H) <150 mg/dL Final    Comment:    . If a non-fasting specimen was collected, consider repeat triglyceride testing on a fasting specimen if clinically indicated.  Veatrice et al. J. of Clin. Lipidol. 2015;9:129-169. SABRA          Passed - Patient is not pregnant

## 2024-09-20 ENCOUNTER — Other Ambulatory Visit: Payer: Self-pay

## 2024-09-23 ENCOUNTER — Other Ambulatory Visit: Payer: Self-pay

## 2024-09-25 ENCOUNTER — Other Ambulatory Visit: Payer: Self-pay

## 2024-09-26 ENCOUNTER — Other Ambulatory Visit: Payer: Self-pay | Admitting: Family Medicine

## 2024-09-26 ENCOUNTER — Other Ambulatory Visit: Payer: Self-pay

## 2024-09-27 ENCOUNTER — Other Ambulatory Visit: Payer: Self-pay

## 2024-09-27 NOTE — Telephone Encounter (Signed)
 Requested medication (s) are due for refill today: yes  Requested medication (s) are on the active medication list: yes  Last refill:  11/09/23  Future visit scheduled: yes  Notes to clinic:  Unable to refill per protocol, cannot delegate.      Requested Prescriptions  Pending Prescriptions Disp Refills   methimazole  (TAPAZOLE ) 5 MG tablet 45 tablet 3    Sig: Take 0.5 tablets (2.5 mg total) by mouth daily.     Not Delegated - Endocrinology:  Hyperthyroid Agents Failed - 09/27/2024  4:20 PM      Failed - This refill cannot be delegated      Failed - TSH in normal range and within 180 days    TSH  Date Value Ref Range Status  10/26/2023 0.20 (L) mIU/L Final    Comment:              Reference Range .           > or = 20 Years  0.40-4.50 .                Pregnancy Ranges           First trimester    0.26-2.66           Second trimester   0.55-2.73           Third trimester    0.43-2.91          Failed - T3 Total in normal range and within 180 days    No results found for: T3TOTAL, T3FREE       Failed - T4 free in normal range and within 180 days    T4, Total  Date Value Ref Range Status  10/26/2023 8.5 5.1 - 11.9 mcg/dL Final   Free T4  Date Value Ref Range Status  11/08/2022 1.0 0.8 - 1.8 ng/dL Final         Failed - Valid encounter within last 6 months    Recent Outpatient Visits           2 months ago No-show for appointment   [facility] Leavy Mole, PA-C

## 2024-10-01 ENCOUNTER — Other Ambulatory Visit: Payer: Self-pay

## 2024-10-01 MED ORDER — METHIMAZOLE 5 MG PO TABS
2.5000 mg | ORAL_TABLET | Freq: Every day | ORAL | 1 refills | Status: AC
  Filled 2024-11-11: qty 45, 90d supply, fill #0

## 2024-10-09 ENCOUNTER — Other Ambulatory Visit: Payer: Self-pay

## 2024-11-01 ENCOUNTER — Ambulatory Visit
Admission: RE | Admit: 2024-11-01 | Discharge: 2024-11-01 | Disposition: A | Source: Ambulatory Visit | Attending: Gastroenterology | Admitting: Gastroenterology

## 2024-11-01 ENCOUNTER — Other Ambulatory Visit: Payer: Self-pay | Admitting: Gastroenterology

## 2024-11-01 DIAGNOSIS — W230XXA Caught, crushed, jammed, or pinched between moving objects, initial encounter: Secondary | ICD-10-CM | POA: Diagnosis not present

## 2024-11-01 DIAGNOSIS — M79645 Pain in left finger(s): Secondary | ICD-10-CM | POA: Diagnosis present

## 2024-11-01 DIAGNOSIS — Y990 Civilian activity done for income or pay: Secondary | ICD-10-CM

## 2024-11-11 ENCOUNTER — Other Ambulatory Visit: Payer: Self-pay

## 2024-11-11 ENCOUNTER — Other Ambulatory Visit: Payer: Self-pay | Admitting: Nurse Practitioner

## 2024-11-11 DIAGNOSIS — I10 Essential (primary) hypertension: Secondary | ICD-10-CM

## 2024-11-11 DIAGNOSIS — J329 Chronic sinusitis, unspecified: Secondary | ICD-10-CM

## 2024-11-11 DIAGNOSIS — E1129 Type 2 diabetes mellitus with other diabetic kidney complication: Secondary | ICD-10-CM

## 2024-11-11 DIAGNOSIS — E782 Mixed hyperlipidemia: Secondary | ICD-10-CM

## 2024-11-11 DIAGNOSIS — J4540 Moderate persistent asthma, uncomplicated: Secondary | ICD-10-CM

## 2024-11-11 MED FILL — Atorvastatin Calcium Tab 40 MG (Base Equivalent): ORAL | 90 days supply | Qty: 90 | Fill #0 | Status: AC

## 2024-11-13 ENCOUNTER — Other Ambulatory Visit: Payer: Self-pay

## 2024-11-13 NOTE — Telephone Encounter (Signed)
 Patient called, no answer, mailbox is full. Sent MyChart message to call the office to schedule physical in order to receive refills.

## 2024-11-13 NOTE — Telephone Encounter (Signed)
 Requested medication (s) are due for refill today: Yes  Requested medication (s) are on the active medication list: Yes  Last refill:  10/26/23  Future visit scheduled: No  Notes to clinic:  Unable to refill per protocol, appointment needed, no updated labs.      Requested Prescriptions  Pending Prescriptions Disp Refills   metFORMIN  (GLUCOPHAGE ) 1000 MG tablet 90 tablet 3    Sig: Take 1 tablet (1,000 mg total) by mouth daily with breakfast.     Endocrinology:  Diabetes - Biguanides Failed - 11/13/2024 11:58 AM      Failed - Cr in normal range and within 360 days    Creat  Date Value Ref Range Status  10/26/2023 0.62 0.50 - 1.03 mg/dL Final   Creatinine, Urine  Date Value Ref Range Status  06/30/2023 62 20 - 275 mg/dL Final         Failed - HBA1C is between 0 and 7.9 and within 180 days    Hemoglobin A1C  Date Value Ref Range Status  10/26/2023 6.8 (A) 4.0 - 5.6 % Final   Hgb A1c MFr Bld  Date Value Ref Range Status  07/07/2022 5.6 <5.7 % of total Hgb Final    Comment:    For the purpose of screening for the presence of diabetes: . <5.7%       Consistent with the absence of diabetes 5.7-6.4%    Consistent with increased risk for diabetes             (prediabetes) > or =6.5%  Consistent with diabetes . This assay result is consistent with a decreased risk of diabetes. . Currently, no consensus exists regarding use of hemoglobin A1c for diagnosis of diabetes in children. . According to American Diabetes Association (ADA) guidelines, hemoglobin A1c <7.0% represents optimal control in non-pregnant diabetic patients. Different metrics may apply to specific patient populations.  Standards of Medical Care in Diabetes(ADA). .          Failed - eGFR in normal range and within 360 days    GFR, Est African American  Date Value Ref Range Status  05/27/2020 121 > OR = 60 mL/min/1.52m2 Final   GFR, Est Non African American  Date Value Ref Range Status  05/27/2020  105 > OR = 60 mL/min/1.44m2 Final   eGFR  Date Value Ref Range Status  10/26/2023 107 > OR = 60 mL/min/1.41m2 Final         Failed - B12 Level in normal range and within 720 days    Vitamin B-12  Date Value Ref Range Status  12/03/2018 493 200 - 1,100 pg/mL Final         Failed - Valid encounter within last 6 months    Recent Outpatient Visits           3 months ago No-show for appointment   [facility] Leavy Mole, PA-C              Failed - CBC within normal limits and completed in the last 12 months    WBC  Date Value Ref Range Status  10/26/2023 9.9 3.8 - 10.8 Thousand/uL Final   RBC  Date Value Ref Range Status  10/26/2023 4.81 3.80 - 5.10 Million/uL Final   Hemoglobin  Date Value Ref Range Status  10/26/2023 12.6 11.7 - 15.5 g/dL Final  94/68/7982 86.8 11.1 - 15.9 g/dL Final   HCT  Date Value Ref Range Status  10/26/2023 38.6 35.0 - 45.0 %  Final   Hematocrit  Date Value Ref Range Status  04/20/2016 40.0 34.0 - 46.6 % Final   MCHC  Date Value Ref Range Status  10/26/2023 32.6 32.0 - 36.0 g/dL Final    Comment:    For adults, a slight decrease in the calculated MCHC value (in the range of 30 to 32 g/dL) is most likely not clinically significant; however, it should be interpreted with caution in correlation with other red cell parameters and the patient's clinical condition.    [facility]  Date Value Ref Range Status  10/26/2023 26.2 (L) 27.0 - 33.0 pg Final   MCV  Date Value Ref Range Status  10/26/2023 80.2 80.0 - 100.0 fL Final  04/20/2016 78 (L) 79 - 97 fL Final   No results found for: PLTCOUNTKUC, LABPLAT, POCPLA RDW  Date Value Ref Range Status  10/26/2023 14.4 11.0 - 15.0 % Final  04/20/2016 16.4 (H) 12.3 - 15.4 % Final          losartan  (COZAAR ) 50 MG tablet 90 tablet 3    Sig: Take 1 tablet (50 mg total) by mouth daily.     Cardiovascular:  Angiotensin Receptor Blockers Failed - 11/13/2024 11:58 AM       Failed - Cr in normal range and within 180 days    Creat  Date Value Ref Range Status  10/26/2023 0.62 0.50 - 1.03 mg/dL Final   Creatinine, Urine  Date Value Ref Range Status  06/30/2023 62 20 - 275 mg/dL Final         Failed - K in normal range and within 180 days    Potassium  Date Value Ref Range Status  10/26/2023 4.6 3.5 - 5.3 mmol/L Final         Failed - Valid encounter within last 6 months    Recent Outpatient Visits           3 months ago No-show for appointment   [facility] Leavy Mole, PA-C              Passed - Patient is not pregnant      Passed - Last BP in normal range    BP Readings from Last 1 Encounters:  10/26/23 122/72          fluticasone  (FLONASE ) 50 MCG/ACT nasal spray 16 g 6    Sig: Place 2 sprays into both nostrils daily.     Ear, Nose, and Throat: Nasal Preparations - Corticosteroids Failed - 11/13/2024 11:58 AM      Failed - Valid encounter within last 12 months    Recent Outpatient Visits           3 months ago No-show for appointment   [facility] Tapia, Leisa, PA-C               fenofibrate  (TRICOR ) 48 MG tablet 90 tablet 3    Sig: Take 1 tablet (48 mg total) by mouth daily.     Cardiovascular:  Antilipid - Fibric Acid Derivatives Failed - 11/13/2024 11:58 AM      Failed - ALT in normal range and within 360 days    ALT  Date Value Ref Range Status  10/26/2023 21 6 - 29 U/L Final         Failed - AST in normal range and within 360 days    AST  Date Value Ref Range Status  10/26/2023 16 10 - 35 U/L Final  Failed - Cr in normal range and within 360 days    Creat  Date Value Ref Range Status  10/26/2023 0.62 0.50 - 1.03 mg/dL Final   Creatinine, Urine  Date Value Ref Range Status  06/30/2023 62 20 - 275 mg/dL Final         Failed - HGB in normal range and within 360 days    Hemoglobin  Date Value Ref Range Status  10/26/2023 12.6 11.7 -  15.5 g/dL Final  94/68/7982 86.8 11.1 - 15.9 g/dL Final         Failed - HCT in normal range and within 360 days    HCT  Date Value Ref Range Status  10/26/2023 38.6 35.0 - 45.0 % Final   Hematocrit  Date Value Ref Range Status  04/20/2016 40.0 34.0 - 46.6 % Final         Failed - PLT in normal range and within 360 days    Platelets  Date Value Ref Range Status  10/26/2023 346 140 - 400 Thousand/uL Final  04/20/2016 326 150 - 379 x10E3/uL Final         Failed - WBC in normal range and within 360 days    WBC  Date Value Ref Range Status  10/26/2023 9.9 3.8 - 10.8 Thousand/uL Final         Failed - eGFR is 30 or above and within 360 days    GFR, Est African American  Date Value Ref Range Status  05/27/2020 121 > OR = 60 mL/min/1.89m2 Final   GFR, Est Non African American  Date Value Ref Range Status  05/27/2020 105 > OR = 60 mL/min/1.46m2 Final   eGFR  Date Value Ref Range Status  10/26/2023 107 > OR = 60 mL/min/1.50m2 Final         Failed - Valid encounter within last 12 months    Recent Outpatient Visits           3 months ago No-show for appointment   [facility] Leavy Mole, PA-C              Failed - Lipid Panel in normal range within the last 12 months    Cholesterol, Total  Date Value Ref Range Status  04/20/2016 205 (H) 100 - 199 mg/dL Final   Cholesterol  Date Value Ref Range Status  10/26/2023 161 <200 mg/dL Final   LDL Cholesterol (Calc)  Date Value Ref Range Status  10/26/2023 72 mg/dL (calc) Final    Comment:    Reference range: <100 . Desirable range <100 mg/dL for primary prevention;   <70 mg/dL for patients with CHD or diabetic patients  with > or = 2 CHD risk factors. SABRA LDL-C is now calculated using the Martin-Hopkins  calculation, which is a validated novel method providing  better accuracy than the Friedewald equation in the  estimation of LDL-C.  Gladis APPLETHWAITE et al. SANDREA. 7986;689(80): 2061-2068   (http://education.[employer].com/faq/FAQ164)    HDL  Date Value Ref Range Status  10/26/2023 58 > OR = 50 mg/dL Final  94/68/7982 47 >60 mg/dL Final   Triglycerides  Date Value Ref Range Status  10/26/2023 214 (H) <150 mg/dL Final    Comment:    . If a non-fasting specimen was collected, consider repeat triglyceride testing on a fasting specimen if clinically indicated.  Veatrice et al. J. of Clin. Lipidol. 2015;9:129-169. .           albuterol  (VENTOLIN  HFA) 108 (90  Base) MCG/ACT inhaler 6.7 g 2    Sig: Inhale 2 puffs into the lungs every 6 (six) hours as needed for wheezing or shortness of breath.     Pulmonology:  Beta Agonists 2 Failed - 11/13/2024 11:58 AM      Failed - Valid encounter within last 12 months    Recent Outpatient Visits           3 months ago No-show for appointment   [facility] Leavy Mole, PA-C              Passed - Last BP in normal range    BP Readings from Last 1 Encounters:  10/26/23 122/72         Passed - Last Heart Rate in normal range    Pulse Readings from Last 1 Encounters:  10/26/23 92

## 2024-11-15 ENCOUNTER — Other Ambulatory Visit: Payer: Self-pay

## 2024-11-25 DIAGNOSIS — E1129 Type 2 diabetes mellitus with other diabetic kidney complication: Secondary | ICD-10-CM

## 2024-11-25 NOTE — Telephone Encounter (Signed)
 Copied from CRM [id number]. Topic: Clinical - Medication Refill >> Nov 25, 2024  9:56 AM Berneda FALCON wrote: Medication:  metFORMIN  (GLUCOPHAGE ) 1000 MG tablet  Please note, Patient has a TOC appt in Feb with [facility], but is completely out of her Metformin   Has the patient contacted their pharmacy? Yes (Agent: If no, request that the patient contact the pharmacy for the refill. If patient does not wish to contact the pharmacy document the reason why and proceed with request.) (Agent: If yes, when and what did the pharmacy advise?)  This is the patient's preferred pharmacy:  [facility] [address] [place] [address] Phone: [phone] Fax: [phone]  Is this the correct pharmacy for this prescription? Yes If no, delete pharmacy and type the correct one.   Has the prescription been filled recently? No  Is the patient out of the medication? Yes  Has the patient been seen for an appointment in the last year OR does the patient have an upcoming appointment? Yes  Can we respond through MyChart? Yes  Agent: Please be advised that Rx refills may take up to 3 business days. We ask that you follow-up with your pharmacy.

## 2024-11-26 ENCOUNTER — Other Ambulatory Visit: Payer: Self-pay

## 2024-11-26 MED ORDER — METFORMIN HCL 1000 MG PO TABS
1000.0000 mg | ORAL_TABLET | Freq: Every day | ORAL | 0 refills | Status: DC
  Filled 2024-11-26 (×2): qty 30, 30d supply, fill #0

## 2024-12-11 ENCOUNTER — Other Ambulatory Visit: Payer: Self-pay

## 2024-12-11 ENCOUNTER — Other Ambulatory Visit: Payer: Self-pay | Admitting: Nurse Practitioner

## 2024-12-11 DIAGNOSIS — I10 Essential (primary) hypertension: Secondary | ICD-10-CM

## 2024-12-11 DIAGNOSIS — F419 Anxiety disorder, unspecified: Secondary | ICD-10-CM

## 2024-12-12 ENCOUNTER — Encounter: Payer: Self-pay | Admitting: Pharmacist

## 2024-12-12 ENCOUNTER — Other Ambulatory Visit: Payer: Self-pay

## 2024-12-12 MED ORDER — LOSARTAN POTASSIUM 50 MG PO TABS
50.0000 mg | ORAL_TABLET | Freq: Every day | ORAL | 3 refills | Status: AC
  Filled 2024-12-12 – 2024-12-17 (×2): qty 90, 90d supply, fill #0

## 2024-12-12 MED ORDER — FLUOXETINE HCL 40 MG PO CAPS
40.0000 mg | ORAL_CAPSULE | Freq: Every day | ORAL | 3 refills | Status: AC
  Filled 2024-12-12 – 2024-12-17 (×2): qty 90, 90d supply, fill #0

## 2024-12-12 NOTE — Telephone Encounter (Signed)
 Requested medication (s) are due for refill today: expired medication  Requested medication (s) are on the active medication list: yes   Last refill:  cozaar - 10/26/23 #90 3 refills, prozac - 10/26/23 - 12/16/24 #90 3 refills   Future visit scheduled: yes 12/29/24  Notes to clinic:  expired medication - prozac , overdue OV . Do you want to renew prozac  and give courtesy refill for cozaar ?     Requested Prescriptions  Pending Prescriptions Disp Refills   losartan  (COZAAR ) 50 MG tablet 90 tablet 3    Sig: Take 1 tablet (50 mg total) by mouth daily.     Cardiovascular:  Angiotensin Receptor Blockers Failed - 12/12/2024 10:07 AM      Failed - Cr in normal range and within 180 days    Creat  Date Value Ref Range Status  10/26/2023 0.62 0.50 - 1.03 mg/dL Final   Creatinine, Urine  Date Value Ref Range Status  06/30/2023 62 20 - 275 mg/dL Final         Failed - K in normal range and within 180 days    Potassium  Date Value Ref Range Status  10/26/2023 4.6 3.5 - 5.3 mmol/L Final         Failed - Valid encounter within last 6 months    Recent Outpatient Visits           4 months ago No-show for appointment   [facility] Leavy Mole, PA-C              Passed - Patient is not pregnant      Passed - Last BP in normal range    BP Readings from Last 1 Encounters:  10/26/23 122/72          FLUoxetine  (PROZAC ) 40 MG capsule 90 capsule 3    Sig: Take 1 capsule (40 mg total) by mouth daily.     Psychiatry:  Antidepressants - SSRI Failed - 12/12/2024 10:07 AM      Failed - Completed PHQ-2 or PHQ-9 in the last 360 days      Failed - Valid encounter within last 6 months    Recent Outpatient Visits           4 months ago No-show for appointment   [facility] Leavy Mole, PA-C

## 2024-12-17 ENCOUNTER — Other Ambulatory Visit: Payer: Self-pay | Admitting: Nurse Practitioner

## 2024-12-17 ENCOUNTER — Other Ambulatory Visit: Payer: Self-pay

## 2024-12-17 DIAGNOSIS — E1129 Type 2 diabetes mellitus with other diabetic kidney complication: Secondary | ICD-10-CM

## 2024-12-18 ENCOUNTER — Other Ambulatory Visit: Payer: Self-pay

## 2024-12-18 MED ORDER — METFORMIN HCL 1000 MG PO TABS
1000.0000 mg | ORAL_TABLET | Freq: Every day | ORAL | 0 refills | Status: AC
  Filled 2024-12-18: qty 30, 30d supply, fill #0

## 2024-12-18 NOTE — Telephone Encounter (Signed)
 Requested medication (s) are due for refill today: yes  Requested medication (s) are on the active medication list: yes  Last refill:  11/22/24  Future visit scheduled: yes 01/08/25  Notes to clinic:  Unable to refill per protocol, courtesy refill already given, routing for provider approval.   Pharmacy comment: pt does not have enough till appt 2/18, please send another small amount       Requested Prescriptions  Pending Prescriptions Disp Refills   metFORMIN  (GLUCOPHAGE ) 1000 MG tablet 30 tablet 0    Sig: Take 1 tablet (1,000 mg total) by mouth daily with breakfast.     Endocrinology:  Diabetes - Biguanides Failed - 12/18/2024  1:02 PM      Failed - Cr in normal range and within 360 days    Creat  Date Value Ref Range Status  10/26/2023 0.62 0.50 - 1.03 mg/dL Final   Creatinine, Urine  Date Value Ref Range Status  06/30/2023 62 20 - 275 mg/dL Final         Failed - HBA1C is between 0 and 7.9 and within 180 days    Hemoglobin A1C  Date Value Ref Range Status  10/26/2023 6.8 (A) 4.0 - 5.6 % Final   Hgb A1c MFr Bld  Date Value Ref Range Status  07/07/2022 5.6 <5.7 % of total Hgb Final    Comment:    For the purpose of screening for the presence of diabetes: . <5.7%       Consistent with the absence of diabetes 5.7-6.4%    Consistent with increased risk for diabetes             (prediabetes) > or =6.5%  Consistent with diabetes . This assay result is consistent with a decreased risk of diabetes. . Currently, no consensus exists regarding use of hemoglobin A1c for diagnosis of diabetes in children. . According to American Diabetes Association (ADA) guidelines, hemoglobin A1c <7.0% represents optimal control in non-pregnant diabetic patients. Different metrics may apply to specific patient populations.  Standards of Medical Care in Diabetes(ADA). .          Failed - eGFR in normal range and within 360 days    GFR, Est African American  Date Value Ref Range  Status  05/27/2020 121 > OR = 60 mL/min/1.75m2 Final   GFR, Est Non African American  Date Value Ref Range Status  05/27/2020 105 > OR = 60 mL/min/1.22m2 Final   eGFR  Date Value Ref Range Status  10/26/2023 107 > OR = 60 mL/min/1.3m2 Final         Failed - B12 Level in normal range and within 720 days    Vitamin B-12  Date Value Ref Range Status  12/03/2018 493 200 - 1,100 pg/mL Final         Failed - Valid encounter within last 6 months    Recent Outpatient Visits           5 months ago No-show for appointment   [facility] Leavy Mole, PA-C              Failed - CBC within normal limits and completed in the last 12 months    WBC  Date Value Ref Range Status  10/26/2023 9.9 3.8 - 10.8 Thousand/uL Final   RBC  Date Value Ref Range Status  10/26/2023 4.81 3.80 - 5.10 Million/uL Final   Hemoglobin  Date Value Ref Range Status  10/26/2023 12.6 11.7 - 15.5 g/dL Final  04/20/2016 13.1 11.1 - 15.9 g/dL Final   HCT  Date Value Ref Range Status  10/26/2023 38.6 35.0 - 45.0 % Final   Hematocrit  Date Value Ref Range Status  04/20/2016 40.0 34.0 - 46.6 % Final   MCHC  Date Value Ref Range Status  10/26/2023 32.6 32.0 - 36.0 g/dL Final    Comment:    For adults, a slight decrease in the calculated MCHC value (in the range of 30 to 32 g/dL) is most likely not clinically significant; however, it should be interpreted with caution in correlation with other red cell parameters and the patient's clinical condition.    [facility]  Date Value Ref Range Status  10/26/2023 26.2 (L) 27.0 - 33.0 pg Final   MCV  Date Value Ref Range Status  10/26/2023 80.2 80.0 - 100.0 fL Final  04/20/2016 78 (L) 79 - 97 fL Final   No results found for: PLTCOUNTKUC, LABPLAT, POCPLA RDW  Date Value Ref Range Status  10/26/2023 14.4 11.0 - 15.0 % Final  04/20/2016 16.4 (H) 12.3 - 15.4 % Final

## 2025-01-08 ENCOUNTER — Encounter: Admitting: Nurse Practitioner
